# Patient Record
Sex: Female | Born: 1975 | Race: White | Hispanic: No | Smoking: Former smoker
Health system: Southern US, Community
[De-identification: ages and names within clinical notes are randomized; demographics above are authoritative.]

## PROBLEM LIST (undated history)

## (undated) DIAGNOSIS — G9332 Myalgic encephalomyelitis/chronic fatigue syndrome: Secondary | ICD-10-CM

## (undated) DIAGNOSIS — E162 Hypoglycemia, unspecified: Secondary | ICD-10-CM

## (undated) DIAGNOSIS — R5382 Chronic fatigue, unspecified: Secondary | ICD-10-CM

## (undated) DIAGNOSIS — F329 Major depressive disorder, single episode, unspecified: Secondary | ICD-10-CM

## (undated) DIAGNOSIS — G5600 Carpal tunnel syndrome, unspecified upper limb: Secondary | ICD-10-CM

## (undated) DIAGNOSIS — R5383 Other fatigue: Secondary | ICD-10-CM

## (undated) DIAGNOSIS — F32A Depression, unspecified: Secondary | ICD-10-CM

## (undated) DIAGNOSIS — F419 Anxiety disorder, unspecified: Secondary | ICD-10-CM

## (undated) HISTORY — DX: Chronic fatigue, unspecified: R53.82

## (undated) HISTORY — DX: Major depressive disorder, single episode, unspecified: F32.9

## (undated) HISTORY — DX: Depression, unspecified: F32.A

## (undated) HISTORY — DX: Myalgic encephalomyelitis/chronic fatigue syndrome: G93.32

## (undated) HISTORY — DX: Anxiety disorder, unspecified: F41.9

## (undated) HISTORY — DX: Carpal tunnel syndrome, unspecified upper limb: G56.00

## (undated) HISTORY — PX: CARPAL TUNNEL RELEASE: SHX101

## (undated) HISTORY — DX: Hypoglycemia, unspecified: E16.2

## (undated) HISTORY — DX: Other fatigue: R53.83

---

## 2007-11-08 ENCOUNTER — Ambulatory Visit: Payer: Self-pay | Admitting: Orthopedic Surgery

## 2007-11-15 ENCOUNTER — Ambulatory Visit: Payer: Self-pay | Admitting: Orthopedic Surgery

## 2011-10-08 ENCOUNTER — Emergency Department: Payer: Self-pay | Admitting: Emergency Medicine

## 2012-03-09 LAB — HM PAP SMEAR

## 2014-12-15 ENCOUNTER — Other Ambulatory Visit: Payer: Self-pay | Admitting: Unknown Physician Specialty

## 2014-12-15 MED ORDER — AMPHETAMINE-DEXTROAMPHET ER 30 MG PO CP24: 30.0000 mg | ORAL_CAPSULE | Freq: Two times a day (BID) | ORAL | Status: DC

## 2015-01-08 ENCOUNTER — Telehealth: Payer: Self-pay | Admitting: Unknown Physician Specialty

## 2015-01-08 MED ORDER — AMPHETAMINE-DEXTROAMPHETAMINE 30 MG PO TABS: 30.0000 mg | ORAL_TABLET | Freq: Two times a day (BID) | ORAL | Status: DC

## 2015-01-08 NOTE — Telephone Encounter (Signed)
Routing to provider  

## 2015-01-08 NOTE — Telephone Encounter (Signed)
Pt called stated her Adderrall RX is wrong. Pt stated RX should be regular Adderall not XR. Please contact pt ASAP. Thanks.

## 2015-01-08 NOTE — Telephone Encounter (Signed)
I checked [facility] and the [place] Controlled substance reporting system The most recent Rx was written for the extended release version and she takes the short-acting version She couldn't afford to pick up the XR [facility] I will write new Rx for the one she is used to taking and it can be picked up first thing in the morning

## 2015-01-12 ENCOUNTER — Other Ambulatory Visit: Payer: Self-pay | Admitting: Unknown Physician Specialty

## 2015-01-12 MED ORDER — AMPHETAMINE-DEXTROAMPHETAMINE 30 MG PO TABS: 30.0000 mg | ORAL_TABLET | Freq: Two times a day (BID) | ORAL | Status: DC

## 2015-02-10 ENCOUNTER — Emergency Department
Admission: EM | Admit: 2015-02-10 | Discharge: 2015-02-10 | Disposition: A | Discharge status: Home / Self Care | Payer: No Typology Code available for payment source | Attending: Emergency Medicine | Admitting: Emergency Medicine

## 2015-02-10 ENCOUNTER — Encounter: Payer: Self-pay | Admitting: Emergency Medicine

## 2015-02-10 ENCOUNTER — Emergency Department: Payer: No Typology Code available for payment source

## 2015-02-10 DIAGNOSIS — Z720 Tobacco use: Secondary | ICD-10-CM | POA: Diagnosis not present

## 2015-02-10 DIAGNOSIS — Y92410 Unspecified street and highway as the place of occurrence of the external cause: Secondary | ICD-10-CM | POA: Diagnosis not present

## 2015-02-10 DIAGNOSIS — Y998 Other external cause status: Secondary | ICD-10-CM | POA: Diagnosis not present

## 2015-02-10 DIAGNOSIS — R42 Dizziness and giddiness: Secondary | ICD-10-CM

## 2015-02-10 DIAGNOSIS — Y9389 Activity, other specified: Secondary | ICD-10-CM | POA: Diagnosis not present

## 2015-02-10 DIAGNOSIS — S0990XA Unspecified injury of head, initial encounter: Secondary | ICD-10-CM | POA: Diagnosis present

## 2015-02-10 DIAGNOSIS — S0093XA Contusion of unspecified part of head, initial encounter: Secondary | ICD-10-CM | POA: Diagnosis not present

## 2015-02-10 DIAGNOSIS — Z79899 Other long term (current) drug therapy: Secondary | ICD-10-CM | POA: Diagnosis not present

## 2015-02-10 MED ORDER — IBUPROFEN 800 MG PO TABS: 800.0000 mg | ORAL_TABLET | Freq: Three times a day (TID) | ORAL | Status: DC | PRN

## 2015-02-10 MED ORDER — CYCLOBENZAPRINE HCL 5 MG PO TABS
5.0000 mg | ORAL_TABLET | Freq: Three times a day (TID) | ORAL | Status: DC | PRN
Start: 2015-02-10 — End: 2015-06-29

## 2015-02-10 MED ORDER — MECLIZINE HCL 25 MG PO TABS: 25.0000 mg | ORAL_TABLET | Freq: Three times a day (TID) | ORAL | Status: DC | PRN

## 2015-02-10 NOTE — ED Notes (Signed)
Correction to pain pt typo on the back pain pt is having a headache

## 2015-02-10 NOTE — Discharge Instructions (Signed)
Dizziness °Dizziness is a common problem. It is a feeling of unsteadiness or light-headedness. You may feel like you are about to faint. Dizziness can lead to injury if you stumble or fall. A person of any age group can suffer from dizziness, but dizziness is more common in older adults. °CAUSES  °Dizziness can be caused by many different things, including: °· Middle ear problems. °· Standing for too long. °· Infections. °· An allergic reaction. °· Aging. °· An emotional response to something, such as the sight of blood. °· Side effects of medicines. °· Tiredness. °· Problems with circulation or blood pressure. °· Excessive use of alcohol or medicines, or illegal drug use. °· Breathing too fast (hyperventilation). °· An irregular heart rhythm (arrhythmia). °· A low red blood cell count (anemia). °· Pregnancy. °· Vomiting, diarrhea, fever, or other illnesses that cause body fluid loss (dehydration). °· Diseases or conditions such as Parkinson's disease, high blood pressure (hypertension), diabetes, and thyroid problems. °· Exposure to extreme heat. °DIAGNOSIS  °Your health care provider will ask about your symptoms, perform a physical exam, and perform an electrocardiogram (ECG) to record the electrical activity of your heart. Your health care provider may also perform other heart or blood tests to determine the cause of your dizziness. These may include: °· Transthoracic echocardiogram (TTE). During echocardiography, sound waves are used to evaluate how blood flows through your heart. °· Transesophageal echocardiogram (TEE). °· Cardiac monitoring. This allows your health care provider to monitor your heart rate and rhythm in real time. °· Holter monitor. This is a portable device that records your heartbeat and can help diagnose heart arrhythmias. It allows your health care provider to track your heart activity for several days if needed. °· Stress tests by exercise or by giving medicine that makes the heart beat  faster. °TREATMENT  °Treatment of dizziness depends on the cause of your symptoms and can vary greatly. °HOME CARE INSTRUCTIONS  °· Drink enough fluids to keep your urine clear or pale yellow. This is especially important in very hot weather. In older adults, it is also important in cold weather. °· Take your medicine exactly as directed if your dizziness is caused by medicines. When taking blood pressure medicines, it is especially important to get up slowly. °· Rise slowly from chairs and steady yourself until you feel okay. °· In the morning, first sit up on the side of the bed. When you feel okay, stand slowly while holding onto something until you know your balance is fine. °· Move your legs often if you need to stand in one place for a long time. Tighten and relax your muscles in your legs while standing. °· Have someone stay with you for 1-2 days if dizziness continues to be a problem. Do this until you feel you are well enough to stay alone. Have the person call your health care provider if he or she notices changes in you that are concerning. °· Do not drive or use heavy machinery if you feel dizzy. °· Do not drink alcohol. °SEEK IMMEDIATE MEDICAL CARE IF:  °· Your dizziness or light-headedness gets worse. °· You feel nauseous or vomit. °· You have problems talking, walking, or using your arms, hands, or legs. °· You feel weak. °· You are not thinking clearly or you have trouble forming sentences. It may take a friend or family member to notice this. °· You have chest pain, abdominal pain, shortness of breath, or sweating. °· Your vision changes. °· You notice   any bleeding.  You have side effects from medicine that seems to be getting worse rather than better. MAKE SURE YOU:   Understand these instructions.  Will watch your condition.  Will get help right away if you are not doing well or get worse. Document Released: 12/10/2000 Document Revised: 06/21/2013 Document Reviewed: 01/03/2011 [facility]  Patient Information 2015 [place], [place]. This information is not intended to replace advice given to you by your health care provider. Make sure you discuss any questions you have with your health care provider.  Head Injury You have received a head injury. It does not appear serious at this time. Headaches and vomiting are common following head injury. It should be easy to awaken from sleeping. Sometimes it is necessary for you to stay in the emergency department for a while for observation. Sometimes admission to the hospital may be needed. After injuries such as yours, most problems occur within the first 24 hours, but side effects may occur up to 7-10 days after the injury. It is important for you to carefully monitor your condition and contact your health care provider or seek immediate medical care if there is a change in your condition. WHAT ARE THE TYPES OF HEAD INJURIES? Head injuries can be as minor as a bump. Some head injuries can be more severe. More severe head injuries include:  A jarring injury to the brain (concussion).  A bruise of the brain (contusion). This mean there is bleeding in the brain that can cause swelling.  A cracked skull (skull fracture).  Bleeding in the brain that collects, clots, and forms a bump (hematoma). WHAT CAUSES A HEAD INJURY? A serious head injury is most likely to happen to someone who is in a car wreck and is not wearing a seat belt. Other causes of major head injuries include bicycle or motorcycle accidents, sports injuries, and falls. HOW ARE HEAD INJURIES DIAGNOSED? A complete history of the event leading to the injury and your current symptoms will be helpful in diagnosing head injuries. Many times, pictures of the brain, such as CT or MRI are needed to see the extent of the injury. Often, an overnight hospital stay is necessary for observation.  WHEN SHOULD I SEEK IMMEDIATE MEDICAL CARE?  You should get help right away if:  You have confusion  or drowsiness.  You feel sick to your stomach (nauseous) or have continued, forceful vomiting.  You have dizziness or unsteadiness that is getting worse.  You have severe, continued headaches not relieved by medicine. Only take over-the-counter or prescription medicines for pain, fever, or discomfort as directed by your health care provider.  You do not have normal function of the arms or legs or are unable to walk.  You notice changes in the black spots in the center of the colored part of your eye (pupil).  You have a clear or bloody fluid coming from your nose or ears.  You have a loss of vision. During the next 24 hours after the injury, you must stay with someone who can watch you for the warning signs. This person should contact local emergency services (911 in the [place] if you have seizures, you become unconscious, or you are unable to wake up. HOW CAN I PREVENT A HEAD INJURY IN THE FUTURE? The most important factor for preventing major head injuries is avoiding motor vehicle accidents. To minimize the potential for damage to your head, it is crucial to wear seat belts while riding in motor vehicles. Wearing helmets  while bike riding and playing collision sports (like football) is also helpful. Also, avoiding dangerous activities around the house will further help reduce your risk of head injury.  WHEN CAN I RETURN TO NORMAL ACTIVITIES AND ATHLETICS? You should be reevaluated by your health care provider before returning to these activities. If you have any of the following symptoms, you should not return to activities or contact sports until 1 week after the symptoms have stopped:  Persistent headache.  Dizziness or vertigo.  Poor attention and concentration.  Confusion.  Memory problems.  Nausea or vomiting.  Fatigue or tire easily.  Irritability.  Intolerant of bright lights or loud noises.  Anxiety or depression.  Disturbed sleep. MAKE SURE YOU:   Understand  these instructions.  Will watch your condition.  Will get help right away if you are not doing well or get worse. Document Released: 06/16/2005 Document Revised: 06/21/2013 Document Reviewed: 02/21/2013 [facility] Patient Information 2015 [place], [place]. This information is not intended to replace advice given to you by your health care provider. Make sure you discuss any questions you have with your health care provider.  Motor Vehicle Collision It is common to have multiple bruises and sore muscles after a motor vehicle collision (MVC). These tend to feel worse for the first 24 hours. You may have the most stiffness and soreness over the first several hours. You may also feel worse when you wake up the first morning after your collision. After this point, you will usually begin to improve with each day. The speed of improvement often depends on the severity of the collision, the number of injuries, and the location and nature of these injuries. HOME CARE INSTRUCTIONS  Put ice on the injured area.  Put ice in a plastic bag.  Place a towel between your skin and the bag.  Leave the ice on for 15-20 minutes, 3-4 times a day, or as directed by your health care provider.  Drink enough fluids to keep your urine clear or pale yellow. Do not drink alcohol.  Take a warm shower or bath once or twice a day. This will increase blood flow to sore muscles.  You may return to activities as directed by your caregiver. Be careful when lifting, as this may aggravate neck or back pain.  Only take over-the-counter or prescription medicines for pain, discomfort, or fever as directed by your caregiver. Do not use aspirin. This may increase bruising and bleeding. SEEK IMMEDIATE MEDICAL CARE IF:  You have numbness, tingling, or weakness in the arms or legs.  You develop severe headaches not relieved with medicine.  You have severe neck pain, especially tenderness in the middle of the back of your  neck.  You have changes in bowel or bladder control.  There is increasing pain in any area of the body.  You have shortness of breath, light-headedness, dizziness, or fainting.  You have chest pain.  You feel sick to your stomach (nauseous), throw up (vomit), or sweat.  You have increasing abdominal discomfort.  There is blood in your urine, stool, or vomit.  You have pain in your shoulder (shoulder strap areas).  You feel your symptoms are getting worse. MAKE SURE YOU:  Understand these instructions.  Will watch your condition.  Will get help right away if you are not doing well or get worse. Document Released: 06/16/2005 Document Revised: 10/31/2013 Document Reviewed: 11/13/2010 [facility] Patient Information 2015 [place], [place]. This information is not intended to replace advice given to you by  your health care provider. Make sure you discuss any questions you have with your health care provider. ° °

## 2015-02-10 NOTE — ED Notes (Signed)
Pt states she started having a headache last night took some medication but states it hasn't helped. Pt states no hx of this previously.
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
## 2015-02-12 ENCOUNTER — Other Ambulatory Visit: Payer: Self-pay | Admitting: Unknown Physician Specialty

## 2015-02-12 MED ORDER — AMPHETAMINE-DEXTROAMPHETAMINE 30 MG PO TABS: 30.0000 mg | ORAL_TABLET | Freq: Two times a day (BID) | ORAL | Status: DC

## 2015-02-14 ENCOUNTER — Other Ambulatory Visit: Payer: Self-pay | Admitting: Unknown Physician Specialty

## 2015-03-09 ENCOUNTER — Other Ambulatory Visit: Payer: Self-pay | Admitting: Unknown Physician Specialty

## 2015-03-09 MED ORDER — AMPHETAMINE-DEXTROAMPHETAMINE 30 MG PO TABS: 30.0000 mg | ORAL_TABLET | Freq: Two times a day (BID) | ORAL | Status: DC

## 2015-03-15 ENCOUNTER — Other Ambulatory Visit: Payer: Self-pay | Admitting: Unknown Physician Specialty

## 2015-03-15 NOTE — Telephone Encounter (Signed)
Last visit was more than 6 months ago; I will approve one month of requested Rx

## 2015-04-06 ENCOUNTER — Other Ambulatory Visit: Payer: Self-pay | Admitting: Unknown Physician Specialty

## 2015-04-06 MED ORDER — AMPHETAMINE-DEXTROAMPHETAMINE 30 MG PO TABS: 30.0000 mg | ORAL_TABLET | Freq: Two times a day (BID) | ORAL | Status: DC

## 2015-04-17 ENCOUNTER — Other Ambulatory Visit: Payer: Self-pay | Admitting: Family Medicine

## 2015-04-17 NOTE — Telephone Encounter (Signed)
Routing to provider  

## 2015-05-18 ENCOUNTER — Other Ambulatory Visit: Payer: Self-pay | Admitting: Unknown Physician Specialty

## 2015-06-05 ENCOUNTER — Other Ambulatory Visit: Payer: Self-pay

## 2015-06-05 ENCOUNTER — Other Ambulatory Visit: Payer: Self-pay | Admitting: Unknown Physician Specialty

## 2015-06-05 MED ORDER — AMPHETAMINE-DEXTROAMPHETAMINE 30 MG PO TABS: 30.0000 mg | ORAL_TABLET | Freq: Two times a day (BID) | ORAL | Status: DC

## 2015-06-05 NOTE — Telephone Encounter (Signed)
Patient called requesting a refill for her adderall. Patient was last seen in March and [facility] number is [phone]. Cheryl please route this back to me so I can call and schedule the patient an appointment.

## 2015-06-05 NOTE — Telephone Encounter (Signed)
She should be on automatic refills

## 2015-06-05 NOTE — Telephone Encounter (Signed)
Courtney CoasterGina said she was taken off that list because her prescriptions were sitting up front for a month.

## 2015-06-06 NOTE — Telephone Encounter (Signed)
Medication was refilled so I tried to call to let her know but the line was busy so I will try to call again later.

## 2015-06-08 NOTE — Telephone Encounter (Signed)
Looked in [facility] to see if there was a different phone number listed and there was. So I called it and let the patient know her prescription was ready to be picked up. I also scheduled her a follow-up visit for 06/29/15.

## 2015-06-18 DIAGNOSIS — E162 Hypoglycemia, unspecified: Secondary | ICD-10-CM | POA: Insufficient documentation

## 2015-06-18 DIAGNOSIS — F329 Major depressive disorder, single episode, unspecified: Secondary | ICD-10-CM

## 2015-06-18 DIAGNOSIS — G5600 Carpal tunnel syndrome, unspecified upper limb: Secondary | ICD-10-CM | POA: Insufficient documentation

## 2015-06-18 DIAGNOSIS — G9332 Myalgic encephalomyelitis/chronic fatigue syndrome: Secondary | ICD-10-CM | POA: Insufficient documentation

## 2015-06-18 DIAGNOSIS — F419 Anxiety disorder, unspecified: Secondary | ICD-10-CM

## 2015-06-18 DIAGNOSIS — F411 Generalized anxiety disorder: Secondary | ICD-10-CM | POA: Insufficient documentation

## 2015-06-18 DIAGNOSIS — R5383 Other fatigue: Secondary | ICD-10-CM

## 2015-06-18 DIAGNOSIS — R5382 Chronic fatigue, unspecified: Secondary | ICD-10-CM

## 2015-06-29 ENCOUNTER — Ambulatory Visit (INDEPENDENT_AMBULATORY_CARE_PROVIDER_SITE_OTHER): Payer: Self-pay | Admitting: Unknown Physician Specialty

## 2015-06-29 ENCOUNTER — Encounter: Payer: Self-pay | Admitting: Unknown Physician Specialty

## 2015-06-29 VITALS — BP 124/80 | HR 109 | Temp 98.9°F | Ht 64.0 in | Wt 143.2 lb

## 2015-06-29 DIAGNOSIS — F418 Other specified anxiety disorders: Secondary | ICD-10-CM

## 2015-06-29 DIAGNOSIS — F329 Major depressive disorder, single episode, unspecified: Secondary | ICD-10-CM

## 2015-06-29 DIAGNOSIS — F32A Depression, unspecified: Secondary | ICD-10-CM

## 2015-06-29 DIAGNOSIS — F419 Anxiety disorder, unspecified: Principal | ICD-10-CM

## 2015-06-29 MED ORDER — FLUOXETINE HCL 40 MG PO CAPS: 40.0000 mg | ORAL_CAPSULE | Freq: Every day | ORAL | Status: DC

## 2015-06-29 MED ORDER — AMPHETAMINE-DEXTROAMPHETAMINE 30 MG PO TABS: 30.0000 mg | ORAL_TABLET | Freq: Two times a day (BID) | ORAL | Status: DC

## 2015-06-29 NOTE — Progress Notes (Signed)
   BP 124/80 mmHg  Pulse 109  Temp(Src) 98.9 F (37.2 C)  Ht 5\' 4"  (1.626 m)  Wt 143 lb 3.2 oz (64.955 kg)  BMI 24.57 kg/m2  SpO2 97%  LMP  (LMP Unknown)   Subjective:    Patient ID: Courtney Davidson, female    DOB: Jan 22, 1976, 39 y.o.   MRN: [id number]  HPI: Courtney Davidson is a 39 y.o. female  Chief Complaint  Patient presents with  . Depression  . Medication Refill    pt states she needs refills on both medications  . ADHD   Depression Courtney Davidson is a patient with long-standing depression who was started on Adderall and Prozac by a psychiatrist a long time ago and maintained on this dose.  Still has bouts of depression but no suicidal plans.  She is typically unwilling to talk about her stressors or depression.  Today she is better than last visit in which she was unable to talk at all  [facility] 06/[facility]/2015  Decreased Interest 1  Down, Depressed, Hopeless 2  PHQ - 2 Score 3  Altered sleeping 2  Tired, decreased energy 1  Change in appetite 1  Feeling bad or failure about yourself  1  Trouble concentrating 0  Moving slowly or fidgety/restless 1  PHQ-9 Score 9      Relevant past medical, surgical, family and social history reviewed and updated as indicated. Interim medical history since our last visit reviewed. Allergies and medications reviewed and updated.  Review of Systems  Per HPI unless specifically indicated above     Objective:    BP 124/80 mmHg  Pulse 109  Temp(Src) 98.9 F (37.2 C)  Ht 5\' 4"  (1.626 m)  Wt 143 lb 3.2 oz (64.955 kg)  BMI 24.57 kg/m2  SpO2 97%  LMP  (LMP Unknown)  Wt Readings from Last 3 Encounters:  06/29/15 143 lb 3.2 oz (64.955 kg)  09/08/14 170 lb (77.111 kg)  02/10/15 163 lb (73.936 kg)    Physical Exam  Constitutional: She is oriented to person, place, and time. She appears well-developed and well-nourished. No distress.  HENT:  Head: Normocephalic and atraumatic.  Eyes: Conjunctivae and lids are  normal. Right eye exhibits no discharge. Left eye exhibits no discharge. No scleral icterus.  Neck: Normal range of motion. Neck supple. No JVD present. Carotid bruit is not present.  Cardiovascular: Normal rate, regular rhythm and normal heart sounds.   Pulmonary/Chest: Effort normal and breath sounds normal.  Abdominal: Normal appearance. There is no splenomegaly or hepatomegaly.  Musculoskeletal: Normal range of motion.  Neurological: She is alert and oriented to person, place, and time.  Skin: Skin is warm, dry and intact. No rash noted. No pallor.  Psychiatric: She has a normal mood and affect. Her behavior is normal. Judgment and thought content normal.    Results for orders placed or performed in visit on 06/18/15  HM PAP SMEAR  Result Value Ref Range   HM Pap smear from PP       Assessment & Plan:   Problem List Items Addressed This Visit      Unprioritized   Anxiety and depression - Primary    Stable, continue present medications.            Follow up plan: Return in about 6 months (around 12/28/2015).

## 2015-06-29 NOTE — Assessment & Plan Note (Signed)
Stable, continue present medications.   

## 2015-07-30 ENCOUNTER — Other Ambulatory Visit: Payer: Self-pay | Admitting: Unknown Physician Specialty

## 2015-07-30 MED ORDER — AMPHETAMINE-DEXTROAMPHETAMINE 30 MG PO TABS: 30.0000 mg | ORAL_TABLET | Freq: Two times a day (BID) | ORAL | Status: DC

## 2015-08-22 ENCOUNTER — Other Ambulatory Visit: Payer: Self-pay | Admitting: Unknown Physician Specialty

## 2015-08-22 MED ORDER — AMPHETAMINE-DEXTROAMPHETAMINE 30 MG PO TABS: 30.0000 mg | ORAL_TABLET | Freq: Two times a day (BID) | ORAL | Status: DC

## 2015-08-29 ENCOUNTER — Ambulatory Visit: Payer: Self-pay | Admitting: Unknown Physician Specialty

## 2015-09-21 ENCOUNTER — Other Ambulatory Visit: Payer: Self-pay | Admitting: Unknown Physician Specialty

## 2015-09-21 MED ORDER — AMPHETAMINE-DEXTROAMPHETAMINE 30 MG PO TABS: 30.0000 mg | ORAL_TABLET | Freq: Two times a day (BID) | ORAL | Status: DC

## 2015-10-15 ENCOUNTER — Other Ambulatory Visit: Payer: Self-pay | Admitting: Unknown Physician Specialty

## 2015-10-15 MED ORDER — AMPHETAMINE-DEXTROAMPHETAMINE 30 MG PO TABS: 30.0000 mg | ORAL_TABLET | Freq: Two times a day (BID) | ORAL | Status: DC

## 2015-11-12 ENCOUNTER — Other Ambulatory Visit: Payer: Self-pay | Admitting: Unknown Physician Specialty

## 2015-11-12 MED ORDER — AMPHETAMINE-DEXTROAMPHETAMINE 30 MG PO TABS: 30.0000 mg | ORAL_TABLET | Freq: Two times a day (BID) | ORAL | Status: DC

## 2015-12-11 ENCOUNTER — Other Ambulatory Visit: Payer: Self-pay | Admitting: Unknown Physician Specialty

## 2015-12-11 MED ORDER — AMPHETAMINE-DEXTROAMPHETAMINE 30 MG PO TABS: 30.0000 mg | ORAL_TABLET | Freq: Two times a day (BID) | ORAL | Status: DC

## 2016-01-14 ENCOUNTER — Other Ambulatory Visit: Payer: Self-pay | Admitting: Unknown Physician Specialty

## 2016-01-14 MED ORDER — AMPHETAMINE-DEXTROAMPHETAMINE 30 MG PO TABS: 30.0000 mg | ORAL_TABLET | Freq: Two times a day (BID) | ORAL | Status: DC

## 2016-01-15 ENCOUNTER — Encounter: Payer: Self-pay | Admitting: Emergency Medicine

## 2016-01-15 ENCOUNTER — Inpatient Hospital Stay
Admission: EM | Admit: 2016-01-15 | Discharge: 2016-01-18 | DRG: 872 | Disposition: A | Discharge status: Home / Self Care | Payer: Managed Care, Other (non HMO) | Attending: Internal Medicine | Admitting: Internal Medicine

## 2016-01-15 ENCOUNTER — Emergency Department: Payer: Managed Care, Other (non HMO)

## 2016-01-15 DIAGNOSIS — Z79899 Other long term (current) drug therapy: Secondary | ICD-10-CM | POA: Diagnosis not present

## 2016-01-15 DIAGNOSIS — R739 Hyperglycemia, unspecified: Secondary | ICD-10-CM

## 2016-01-15 DIAGNOSIS — R7989 Other specified abnormal findings of blood chemistry: Secondary | ICD-10-CM

## 2016-01-15 DIAGNOSIS — E876 Hypokalemia: Secondary | ICD-10-CM

## 2016-01-15 DIAGNOSIS — F419 Anxiety disorder, unspecified: Secondary | ICD-10-CM | POA: Diagnosis present

## 2016-01-15 DIAGNOSIS — A4151 Sepsis due to Escherichia coli [E. coli]: Principal | ICD-10-CM | POA: Diagnosis present

## 2016-01-15 DIAGNOSIS — R911 Solitary pulmonary nodule: Secondary | ICD-10-CM | POA: Diagnosis present

## 2016-01-15 DIAGNOSIS — Z8249 Family history of ischemic heart disease and other diseases of the circulatory system: Secondary | ICD-10-CM

## 2016-01-15 DIAGNOSIS — A419 Sepsis, unspecified organism: Secondary | ICD-10-CM

## 2016-01-15 DIAGNOSIS — I248 Other forms of acute ischemic heart disease: Secondary | ICD-10-CM | POA: Diagnosis present

## 2016-01-15 DIAGNOSIS — K8020 Calculus of gallbladder without cholecystitis without obstruction: Secondary | ICD-10-CM

## 2016-01-15 DIAGNOSIS — N10 Acute tubulo-interstitial nephritis: Secondary | ICD-10-CM | POA: Diagnosis present

## 2016-01-15 DIAGNOSIS — N12 Tubulo-interstitial nephritis, not specified as acute or chronic: Secondary | ICD-10-CM | POA: Diagnosis present

## 2016-01-15 DIAGNOSIS — E871 Hypo-osmolality and hyponatremia: Secondary | ICD-10-CM | POA: Diagnosis present

## 2016-01-15 DIAGNOSIS — R5382 Chronic fatigue, unspecified: Secondary | ICD-10-CM | POA: Diagnosis present

## 2016-01-15 DIAGNOSIS — D72829 Elevated white blood cell count, unspecified: Secondary | ICD-10-CM

## 2016-01-15 DIAGNOSIS — F17210 Nicotine dependence, cigarettes, uncomplicated: Secondary | ICD-10-CM | POA: Diagnosis present

## 2016-01-15 DIAGNOSIS — R778 Other specified abnormalities of plasma proteins: Secondary | ICD-10-CM

## 2016-01-15 DIAGNOSIS — F329 Major depressive disorder, single episode, unspecified: Secondary | ICD-10-CM | POA: Diagnosis present

## 2016-01-15 LAB — URINALYSIS COMPLETE WITH MICROSCOPIC (ARMC ONLY)
Bilirubin Urine: NEGATIVE
GLUCOSE, UA: 50 mg/dL — AB
KETONES UR: NEGATIVE mg/dL
Nitrite: NEGATIVE
PH: 5 (ref 5.0–8.0)
Protein, ur: >500 mg/dL — AB
SPECIFIC GRAVITY, URINE: 1.014 (ref 1.005–1.030)
TRANS EPITHEL UA: 4

## 2016-01-15 LAB — TROPONIN I
Troponin I: 0.2 ng/mL (ref <0.03)
Troponin I: 0.12 ng/mL (ref <0.03)

## 2016-01-15 LAB — BASIC METABOLIC PANEL
ANION GAP: 9 (ref 5–15)
BUN: 9 mg/dL (ref 6–20)
CALCIUM: 8.6 mg/dL — AB (ref 8.9–10.3)
CO2: 22 mmol/L (ref 22–32)
Chloride: 99 mmol/L — ABNORMAL LOW (ref 101–111)
Creatinine, Ser: 0.85 mg/dL (ref 0.44–1.00)
Glucose, Bld: 198 mg/dL — ABNORMAL HIGH (ref 65–99)
Potassium: 3.1 mmol/L — ABNORMAL LOW (ref 3.5–5.1)
Sodium: 130 mmol/L — ABNORMAL LOW (ref 135–145)

## 2016-01-15 LAB — POCT PREGNANCY, URINE: Preg Test, Ur: NEGATIVE

## 2016-01-15 LAB — CBC
HCT: 36.9 % (ref 35.0–47.0)
HEMOGLOBIN: 12.9 g/dL (ref 12.0–16.0)
MCH: 30.9 pg (ref 26.0–34.0)
MCHC: 35 g/dL (ref 32.0–36.0)
MCV: 88.2 fL (ref 80.0–100.0)
PLATELETS: 244 10*3/uL (ref 150–440)
RBC: 4.18 MIL/uL (ref 3.80–5.20)
RDW: 12.7 % (ref 11.5–14.5)
WBC: 16.7 10*3/uL — ABNORMAL HIGH (ref 3.6–11.0)

## 2016-01-15 LAB — HEPATIC FUNCTION PANEL
ALT: 20 U/L (ref 14–54)
AST: 33 U/L (ref 15–41)
Albumin: 3.5 g/dL (ref 3.5–5.0)
Alkaline Phosphatase: 53 U/L (ref 38–126)
Bilirubin, Direct: 0.1 mg/dL (ref 0.1–0.5)
Indirect Bilirubin: 0.6 mg/dL (ref 0.3–0.9)
Total Bilirubin: 0.7 mg/dL (ref 0.3–1.2)
Total Protein: 7 g/dL (ref 6.5–8.1)

## 2016-01-15 LAB — TSH: TSH: 0.858 u[IU]/mL (ref 0.350–4.500)

## 2016-01-15 LAB — MAGNESIUM: Magnesium: 1.7 mg/dL (ref 1.7–2.4)

## 2016-01-15 LAB — LACTIC ACID, PLASMA: Lactic Acid, Venous: 0.6 mmol/L (ref 0.5–1.9)

## 2016-01-15 MED ORDER — FLUOXETINE HCL 20 MG PO CAPS
40.0000 mg | ORAL_CAPSULE | Freq: Every day | ORAL | Status: DC
  Administered 2016-01-16 – 2016-01-18 (×3): 40 mg via ORAL
  Filled 2016-01-15 (×3): qty 2

## 2016-01-15 MED ORDER — SODIUM CHLORIDE 0.9 % IV BOLUS (SEPSIS)
1000.0000 mL | Freq: Once | INTRAVENOUS | Status: AC
  Administered 2016-01-15: 1000 mL via INTRAVENOUS

## 2016-01-15 MED ORDER — DOCUSATE SODIUM 100 MG PO CAPS
100.0000 mg | ORAL_CAPSULE | Freq: Two times a day (BID) | ORAL | Status: DC
  Administered 2016-01-15 – 2016-01-18 (×6): 100 mg via ORAL
  Filled 2016-01-15 (×6): qty 1

## 2016-01-15 MED ORDER — ACETAMINOPHEN 500 MG PO TABS
1000.0000 mg | ORAL_TABLET | Freq: Four times a day (QID) | ORAL | Status: DC | PRN
  Administered 2016-01-15 – 2016-01-17 (×4): 1000 mg via ORAL
  Filled 2016-01-15 (×3): qty 2

## 2016-01-15 MED ORDER — IBUPROFEN 400 MG PO TABS
ORAL_TABLET | ORAL | Status: AC
  Administered 2016-01-15: 400 mg via ORAL
  Filled 2016-01-15: qty 1

## 2016-01-15 MED ORDER — NICOTINE 21 MG/24HR TD PT24
21.0000 mg | MEDICATED_PATCH | Freq: Every day | TRANSDERMAL | Status: DC
  Administered 2016-01-15 – 2016-01-18 (×4): 21 mg via TRANSDERMAL
  Filled 2016-01-15 (×4): qty 1

## 2016-01-15 MED ORDER — DEXTROSE 5 % IV SOLN
2.0000 g | INTRAVENOUS | Status: DC
  Filled 2016-01-15: qty 2

## 2016-01-15 MED ORDER — ENOXAPARIN SODIUM 40 MG/0.4ML ~~LOC~~ SOLN
40.0000 mg | SUBCUTANEOUS | Status: DC
Start: 2016-01-15 — End: 2016-01-18
  Administered 2016-01-15 – 2016-01-17 (×3): 40 mg via SUBCUTANEOUS
  Filled 2016-01-15 (×3): qty 0.4

## 2016-01-15 MED ORDER — HYDROCODONE-ACETAMINOPHEN 5-325 MG PO TABS
1.0000 | ORAL_TABLET | ORAL | Status: DC | PRN
Start: 2016-01-15 — End: 2016-01-18
  Administered 2016-01-16: 1 via ORAL
  Administered 2016-01-16: 2 via ORAL
  Filled 2016-01-15: qty 2
  Filled 2016-01-15: qty 1

## 2016-01-15 MED ORDER — SODIUM CHLORIDE 0.9% FLUSH
3.0000 mL | Freq: Two times a day (BID) | INTRAVENOUS | Status: DC
  Administered 2016-01-16 – 2016-01-18 (×3): 3 mL via INTRAVENOUS

## 2016-01-15 MED ORDER — ONDANSETRON HCL 4 MG/2ML IJ SOLN
4.0000 mg | Freq: Four times a day (QID) | INTRAMUSCULAR | Status: DC | PRN
  Administered 2016-01-16: 4 mg via INTRAVENOUS
  Filled 2016-01-15: qty 2

## 2016-01-15 MED ORDER — SODIUM CHLORIDE 0.9 % IV BOLUS (SEPSIS)
250.0000 mL | Freq: Once | INTRAVENOUS | Status: AC
  Administered 2016-01-15: 250 mL via INTRAVENOUS

## 2016-01-15 MED ORDER — ACETAMINOPHEN 650 MG RE SUPP: 650.0000 mg | Freq: Four times a day (QID) | RECTAL | Status: DC | PRN

## 2016-01-15 MED ORDER — ACETAMINOPHEN 500 MG PO TABS
ORAL_TABLET | ORAL | Status: AC
  Administered 2016-01-15: 1000 mg via ORAL
  Filled 2016-01-15: qty 2

## 2016-01-15 MED ORDER — DEXTROSE 5 % IV SOLN
2.0000 g | Freq: Once | INTRAVENOUS | Status: AC
  Administered 2016-01-15: 2 g via INTRAVENOUS
  Filled 2016-01-15: qty 2

## 2016-01-15 MED ORDER — ACETAMINOPHEN 325 MG PO TABS: 650.0000 mg | ORAL_TABLET | Freq: Four times a day (QID) | ORAL | Status: DC | PRN

## 2016-01-15 MED ORDER — NICOTINE POLACRILEX 2 MG MT GUM
2.0000 mg | CHEWING_GUM | OROMUCOSAL | Status: DC | PRN
  Filled 2016-01-15: qty 1

## 2016-01-15 MED ORDER — AMPHETAMINE-DEXTROAMPHETAMINE 5 MG PO TABS
30.0000 mg | ORAL_TABLET | Freq: Two times a day (BID) | ORAL | Status: DC
  Administered 2016-01-16 – 2016-01-18 (×5): 30 mg via ORAL
  Filled 2016-01-15 (×6): qty 6

## 2016-01-15 MED ORDER — ONDANSETRON HCL 4 MG PO TABS: 4.0000 mg | ORAL_TABLET | Freq: Four times a day (QID) | ORAL | Status: DC | PRN

## 2016-01-15 MED ORDER — POTASSIUM CHLORIDE IN NACL 20-0.9 MEQ/L-% IV SOLN
INTRAVENOUS | Status: DC
Start: 2016-01-15 — End: 2016-01-18
  Administered 2016-01-15 – 2016-01-16 (×3): via INTRAVENOUS
  Administered 2016-01-17: 1000 mL via INTRAVENOUS
  Administered 2016-01-17 (×2): via INTRAVENOUS
  Filled 2016-01-15 (×9): qty 1000

## 2016-01-15 MED ORDER — ASPIRIN EC 325 MG PO TBEC
325.0000 mg | DELAYED_RELEASE_TABLET | Freq: Every day | ORAL | Status: DC
  Administered 2016-01-15 – 2016-01-18 (×4): 325 mg via ORAL
  Filled 2016-01-15 (×4): qty 1

## 2016-01-15 MED ORDER — IBUPROFEN 200 MG PO TABS
400.0000 mg | ORAL_TABLET | ORAL | Status: DC | PRN
  Administered 2016-01-15: 400 mg via ORAL

## 2016-01-15 NOTE — ED Provider Notes (Signed)
[facility] Emergency Department Provider Note   ____________________________________________  Time seen: ~1600  I have reviewed the triage vital signs and the nursing notes.   HISTORY  Chief Complaint Flank Pain   History limited by: Not Limited   HPI Courtney Davidson is a 40 y.o. female who presents from [facility] today because of concerns for right kidney stone. Patient states that starting about a week ago she was having pain in her lower abdomen. Yesterday the pain started migrating up into her right flank. It is severe. She has had some associated nausea. She denies history of kidney stones. [facility] where an urine showed 3+ blood and an acute abdomen series x-ray was performed which showed possible right kidney stone. The patient states that she has had fevers starting yesterday. She has had dysuria and increased urinary frequency.   Past Medical History  Diagnosis Date  . Fatigue   . Carpal tunnel syndrome   . Depression   . Anxiety   . Hypoglycemia   . CFS (chronic fatigue syndrome)     Patient Active Problem List   Diagnosis Date Noted  . Fatigue 06/18/2015  . Carpal tunnel syndrome 06/18/2015  . Anxiety and depression 06/18/2015  . Hypoglycemia 06/18/2015  . CFS (chronic fatigue syndrome) 06/18/2015    Past Surgical History  Procedure Laterality Date  . Cesarean section    . Carpal tunnel release Bilateral     Current Outpatient Rx  Name  Route  Sig  Dispense  Refill  . amphetamine-dextroamphetamine (ADDERALL) 30 MG tablet   Oral   Take 1 tablet by mouth 2 (two) times daily.   56 tablet   0   . FLUoxetine (PROZAC) 40 MG capsule   Oral   Take 1 capsule (40 mg total) by mouth daily.   90 capsule   1     Allergies Review of patient's allergies indicates no known allergies.  Family History  Problem Relation Age of Onset  . Cancer Mother     lung  . Diabetes Mother   . Stroke Mother    . Hypertension Mother     Social History Social History  Substance Use Topics  . Smoking status: Current Every Day Smoker    Types: Cigarettes  . Smokeless tobacco: Never Used  . Alcohol Use: No    Review of Systems  Constitutional: Positive for fever Cardiovascular: Negative for chest pain. Respiratory: Negative for shortness of breath. Gastrointestinal:Acid for right flank pain Genitourinary: Positive for dysuria Musculoskeletal: Negative for back pain. Skin: Negative for rash. Neurological: Negative for headaches, focal weakness or numbness.  10-point ROS otherwise negative.  ____________________________________________   PHYSICAL EXAM:  VITAL SIGNS: ED Triage Vitals  Enc Vitals Group     BP 01/15/16 1336 100/53 mmHg     Pulse Rate 01/15/16 1336 114     Resp 01/15/16 1336 22     Temp 01/15/16 1336 100.2 F (37.9 C)     Temp Source 01/15/16 1336 Oral     SpO2 01/15/16 1336 97 %     Weight 01/15/16 1336 148 lb (67.132 kg)     Height 01/15/16 1336 5\' 4"  (1.626 m)     Head Cir --      Peak Flow --      Pain Score 01/15/16 1341 6   Constitutional: Alert and oriented. Well appearing and in no distress. Eyes: Conjunctivae are normal. PERRL. Normal extraocular movements. ENT  Head: Normocephalic and atraumatic.   Nose: No congestion/rhinnorhea.   Mouth/Throat: Mucous membranes are moist.   Neck: No stridor. Hematological/Lymphatic/Immunilogical: No cervical lymphadenopathy. Cardiovascular: Normal rate, regular rhythm.  No murmurs, rubs, or gallops. Respiratory: Normal respiratory effort without tachypnea nor retractions. Breath sounds are clear and equal bilaterally. No wheezes/rales/rhonchi. Gastrointestinal: Soft and Tender to palpation of the right side. No distention. Positive right-sided CVA tenderness Genitourinary: Deferred Musculoskeletal: Normal range of motion in all extremities. No joint effusions.  No lower extremity tenderness nor  edema. Neurologic:  Normal speech and language. No gross focal neurologic deficits are appreciated.  Skin:  Skin is warm, dry and intact. No rash noted. Psychiatric: Mood and affect are normal. Speech and behavior are normal. Patient exhibits appropriate insight and judgment.  ____________________________________________    LABS (pertinent positives/negatives)  Labs Reviewed  URINALYSIS COMPLETEWITH MICROSCOPIC ([facility] ONLY) - Abnormal; Notable for the following:    Color, Urine YELLOW (*)    APPearance TURBID (*)    Glucose, UA 50 (*)    Hgb urine dipstick 2+ (*)    Protein, ur >500 (*)    Leukocytes, UA 3+ (*)    Bacteria, UA FEW (*)    Squamous Epithelial / LPF 0-5 (*)    All other components within normal limits  BASIC METABOLIC PANEL - Abnormal; Notable for the following:    Sodium 130 (*)    Potassium 3.1 (*)    Chloride 99 (*)    Glucose, Bld 198 (*)    Calcium 8.6 (*)    All other components within normal limits  CBC - Abnormal; Notable for the following:    WBC 16.7 (*)    All other components within normal limits  TROPONIN I - Abnormal; Notable for the following:    Troponin I 0.20 (*)    All other components within normal limits  URINE CULTURE  CULTURE, BLOOD (ROUTINE X 2)  CULTURE, BLOOD (ROUTINE X 2)  LACTIC ACID, PLASMA  LACTIC ACID, PLASMA  POCT PREGNANCY, URINE    ____________________________________________   EKG  I, Phineas SemenGraydon Sharlotte Baka, attending physician, personally viewed and interpreted this EKG  EKG Time: 1804 Rate: 130 Rhythm: sinus tachycardia Axis: rightward Intervals: qtc 462 QRS: narrow ST changes: no st elevtion Impression: abnormal ekg  ____________________________________________    RADIOLOGY Ct renal stone  IMPRESSION: Mild asymmetric right perinephric stranding, suspicious for pyelonephritis. No evidence of urolithiasis or hydronephrosis.  3.8 cm benign-appearing left ovarian cyst, most likely  physiologic.  Cholelithiasis. No radiographic evidence of cholecystitis.  Aortic atherosclerosis noted.  5 mm indeterminate left lower lobe pulmonary nodule. No follow-up needed if patient is low-risk. Non-contrast chest CT can be considered in 12 months if patient is high-risk. This recommendation follows the consensus statement: Guidelines for Management of Incidental Pulmonary Nodules Detected on CT Images:From the Fleischner Society 2017; published online before print (10.1148/[phone] [id number]).  ____________________________________________   PROCEDURES  Procedure(s) performed: None  Critical Care performed: Yes, see critical care note(s)  CRITICAL CARE Performed by: Phineas SemenGOODMAN, Manoah Deckard   Total critical care time: 35 minutes  Critical care time was exclusive of separately billable procedures and treating other patients.  Critical care was necessary to treat or prevent imminent or life-threatening deterioration.  Critical care was time spent personally by me on the following activities: development of treatment plan with patient and/or surrogate as well as nursing, discussions with consultants, evaluation of patient's response to treatment, examination of patient, obtaining history from patient or surrogate, ordering and performing treatments and  interventions, ordering and review of laboratory studies, ordering and review of radiographic studies, pulse oximetry and re-evaluation of patient's condition.  ____________________________________________   INITIAL IMPRESSION / ASSESSMENT AND PLAN / ED COURSE  Pertinent labs & imaging results that were available during my care of the patient were reviewed by me and considered in my medical decision making (see chart for details).  Patient presented to the emergency department today because of concerns for right-sided kidney stone. Urinalysis is concerning for infection. It is certainly possible that the blood in the urine is  secondary to an infection. However given that there was outside imaging was concerning for kidney stone will perform CT renal to rule out obstructed infected stone. Blood work is concerning for an elevated leukocytosis and vital signs consistent with Sirs. Will call code sepsis and given broad-spectrum antibiotic.  ----------------------------------------- 6:31 PM on 01/15/2016 -----------------------------------------  CT scanned consistent with pyelonephritis. No sign of stone. Patient's troponin was elevated. At this point I think likely secondary to sepsis. I do not think it represents ACS and no ST elevation noted on EKG. The patient will be admitted to the hospitalist service.  ____________________________________________   FINAL CLINICAL IMPRESSION(S) / ED DIAGNOSES  Final diagnoses:  Pyelonephritis  Sepsis, due to unspecified organism (HCC)  Elevated troponin     Note: This dictation was prepared with Dragon dictation. Any transcriptional errors that result from this process are unintentional    Phineas Semen, MD 01/15/16 [phone]

## 2016-01-15 NOTE — Consult Note (Signed)
Pharmacy Antibiotic Note  Courtney Davidson is a 40 y.o. female admitted on 01/15/2016 with sepsis and UTI.  Pharmacy has been consulted for ceftriaxone dosing.  Plan: ceftriaxone 2g q 24 hours until bacteremia can be ruled out  Height: 5\' 4"  (162.6 cm) Weight: 148 lb (67.132 kg) IBW/kg (Calculated) : 54.7  Temp (24hrs), Avg:100.9 F (38.3 C), Min:99.2 F (37.3 C), Max:103.1 F (39.5 C)   Recent Labs Lab 01/15/16 1343 01/15/16 1623  WBC 16.7*  --   CREATININE 0.85  --   LATICACIDVEN  --  0.6    Estimated Creatinine Clearance: 82.1 mL/min (by C-G formula based on Cr of 0.85).    No Known Allergies  Antimicrobials this admission: Ceftriaxone 7/18  >>    Dose adjustments this admission:   Microbiology results: 7/18 BCx:  7/18 UCx:    Thank you for allowing pharmacy to be a part of this patient's care.  Olene FlossMelissa D Bryanda Mikel, Pharm.D Clinical Pharmacist  01/15/2016 7:22 PM

## 2016-01-15 NOTE — ED Notes (Signed)
Pt reports right flank pain and lower back pain since yesterday; reports urinary frequency x1 week. Today with fever and chills.

## 2016-01-15 NOTE — H&P (Signed)
[facility] [place] [facility]   PATIENT NAME: Courtney Davidson    MR#:  [id number]  DATE OF BIRTH:  1975/07/08  DATE OF ADMISSION:  01/15/2016  PRIMARY CARE PHYSICIAN: No PCP Per Patient   REQUESTING/REFERRING PHYSICIAN:   CHIEF COMPLAINT:   Chief Complaint  Patient presents with  . Flank Pain    HISTORY OF PRESENT ILLNESS: Courtney Davidson  is a 40 y.o. female with a known history of Depression, anxiety, hyperglycemia, chronic fatigue syndrome, who presents to the hospital with complaints of fevers to 102, chills, suprapubic pain, dysuria, right flank pain. According to patient she was doing well up until approximately a week ago when she started having suprapubic abdominal pain, dysuria, she developed fevers about 2 days ago and right-sided flank pain. On arrival to the hospital patient was febrile to 101, during my evaluation to 103, having chills, tachycardic, tachypneic. Physical exam revealed tenderness of right flank, right upper quadrant, suprapubic area. Labs showed elevated white blood cell count, elevated troponin. Patient also complains of feeling presyncopal, especially with standing up, nauseated, increased frequency of urination, incontinence, having achy joints and muscles.  Hospitalist services were contacted for admission  PAST MEDICAL HISTORY:   Past Medical History  Diagnosis Date  . Fatigue   . Carpal tunnel syndrome   . Depression   . Anxiety   . Hypoglycemia   . CFS (chronic fatigue syndrome)     PAST SURGICAL HISTORY: Past Surgical History  Procedure Laterality Date  . Cesarean section    . Carpal tunnel release Bilateral     SOCIAL HISTORY:  Social History  Substance Use Topics  . Smoking status: Current Every Day Smoker    Types: Cigarettes  . Smokeless tobacco: Never Used  . Alcohol Use: No    FAMILY HISTORY:  Family History  Problem Relation Age of Onset  . Cancer Mother     lung  . Diabetes Mother   . Stroke  Mother   . Hypertension Mother     DRUG ALLERGIES: No Known Allergies  Review of Systems  Constitutional: Positive for fever, chills and malaise/fatigue. Negative for weight loss.  HENT: Negative for congestion.   Eyes: Negative for blurred vision and double vision.  Respiratory: Negative for cough, sputum production, shortness of breath and wheezing.   Cardiovascular: Negative for chest pain, palpitations, orthopnea, leg swelling and PND.  Gastrointestinal: Negative for nausea, vomiting, abdominal pain, diarrhea, constipation, blood in stool and melena.  Genitourinary: Positive for dysuria, urgency, frequency and flank pain. Negative for hematuria.  Musculoskeletal: Positive for myalgias. Negative for falls.  Skin: Negative for rash.  Neurological: Positive for weakness. Negative for dizziness.  Psychiatric/Behavioral: Negative for depression and memory loss. The patient is not nervous/anxious.     MEDICATIONS AT HOME:  Prior to Admission medications   Medication Sig Start Date End Date Taking? Authorizing Provider  amphetamine-dextroamphetamine (ADDERALL) 30 MG tablet Take 1 tablet by mouth 2 (two) times daily. 01/14/16  Yes Gabriel Cirri, NP  FLUoxetine (PROZAC) 40 MG capsule Take 1 capsule (40 mg total) by mouth daily. 06/29/15  Yes Gabriel Cirri, NP      PHYSICAL EXAMINATION:   VITAL SIGNS: Blood pressure 91/65, pulse 113, temperature 99.2 F (37.3 C), temperature source Oral, resp. rate 22, height  (1.626 m), weight 67.132 kg (148 lb), last menstrual period 01/01/2016, SpO2 98 %.  GENERAL:  40 y.o.-year-old patient lying in the bed In moderate to severe distress having shaken chills.  EYES: Pupils equal, round, reactive to light and accommodation. No scleral icterus. Extraocular muscles intact.  HEENT: Head atraumatic, normocephalic. Oropharynx and nasopharynx clear.  NECK:  Supple, no jugular venous distention. No thyroid enlargement, no tenderness.  LUNGS: Some  diminished breath sounds bilaterally, no wheezing, rales,rhonchi or crepitation. No use of accessory muscles of respiration. Tachypneic.   CARDIOVASCULAR: S1, S2 distant. No murmurs, rubs, or gallops.  ABDOMEN: Soft, tender in right upper quadrant, some voluntary guarding, suprapubically, overall, right side more than the left side, nondistended. Bowel sounds present. No organomegaly or mass. CVA tenderness on percussion on the right EXTREMITIES: No pedal edema, cyanosis, or clubbing.  NEUROLOGIC: Cranial nerves II through XII are intact. Muscle strength 5/5 in all extremities. Sensation intact. Gait not checked.  PSYCHIATRIC: The patient is alert and oriented x 3.  SKIN: No obvious rash, lesion, or ulcer.   LABORATORY PANEL:   CBC  Recent Labs Lab 01/15/16 1343  WBC 16.7*  HGB 12.9  HCT 36.9  PLT 244  MCV 88.2  MCH 30.9  MCHC 35.0  RDW 12.7   ------------------------------------------------------------------------------------------------------------------  Chemistries   Recent Labs Lab 01/15/16 1343  NA 130*  K 3.1*  CL 99*  CO2 22  GLUCOSE 198*  BUN 9  CREATININE 0.85  CALCIUM 8.6*   ------------------------------------------------------------------------------------------------------------------  Cardiac Enzymes  Recent Labs Lab 01/15/16 1358  TROPONINI 0.20*   ------------------------------------------------------------------------------------------------------------------  RADIOLOGY: Ct Renal Stone Study  01/15/2016  CLINICAL DATA:  Right flank and low back pain since yesterday. Fever and chills. Urinary frequency. EXAM: CT ABDOMEN AND PELVIS WITHOUT CONTRAST TECHNIQUE: Multidetector CT imaging of the abdomen and pelvis was performed following the standard protocol without IV contrast. COMPARISON:  None. FINDINGS: Lower chest: 5 mm pulmonary nodules seen in the posterior left lower lobe on image 5/series 4. Hepatobiliary: No mass visualized on this  un-enhanced exam. Calcified gallstone noted without evidence of cholecystitis or biliary dilatation. Pancreas: No mass or inflammatory process identified on this un-enhanced exam. Spleen: Within normal limits in size. Adrenals/Urinary Tract: No evidence of urolithiasis or hydronephrosis. Mild asymmetric right perinephric stranding is seen, suspicious for pyelonephritis. Unopacified urinary bladder is unremarkable in appearance. Stomach/Bowel: No evidence of obstruction, inflammatory process, or abnormal fluid collections. Normal appendix visualized. Vascular/Lymphatic: No pathologically enlarged lymph nodes. No evidence of abdominal aortic aneurysm. Aortic atherosclerosis noted. Reproductive: Normal appearance of uterus. 3.8 cm benign-appearing left ovarian cyst is seen which is most likely physiologic in a reproductive age female. No evidence of free fluid. Other: None. Musculoskeletal:  No suspicious bone lesions identified. IMPRESSION: Mild asymmetric right perinephric stranding, suspicious for pyelonephritis. No evidence of urolithiasis or hydronephrosis. 3.8 cm benign-appearing left ovarian cyst, most likely physiologic. Cholelithiasis.  No radiographic evidence of cholecystitis. Aortic atherosclerosis noted. 5 mm indeterminate left lower lobe pulmonary nodule. No follow-up needed if patient is low-risk. Non-contrast chest CT can be considered in 12 months if patient is high-risk. This recommendation follows the consensus statement: Guidelines for Management of Incidental Pulmonary Nodules Detected on CT Images:From the Fleischner Society 2017; published online before print (10.1148/[id number]). Electronically Signed   By: Myles RosenthalJohn  Stahl M.D.   On: 01/15/2016 17:08    EKG: Orders placed or performed during the hospital encounter of 01/15/16  . EKG 12-Lead  . EKG 12-Lead    IMPRESSION AND PLAN:  Active Problems:   Sepsis (HCC)   Acute pyelonephritis   Hyponatremia   Hypokalemia   Elevated  troponin   Leukocytosis   Hyperglycemia   Lung nodule <  6cm on CT   Cholelithiasis #1. Sepsis due to acute pyelonephritis, admitted to a medical floor. Continue IV fluids, antibiotics, blood cultures were taken as well as urinary cultures, follow  and adjust antibiotics depending on culture results #2. Acute pyelonephritis, cultures are taken, continue antibiotic therapy, just antibiotics according to culture results as above, CT scan failed to reveal stone or hydronephrosis #3. Hyponatremia, initiate IV fluids, follow sodium level in the morning #4. Hypokalemia, supplement intravenously, check magnesium level, supplement as needed #5. Leukocytosis, follow with therapy #6. Elevated troponin, likely demand ischemia, get echocardiogram, start aspirin, unable to use beta blockers and nitroglycerin due to hypotension. Get EKG  #7. Hypotension, continue IV fluids at high rate #8. Hyperglycemia, get hemoglobin A1c #9. Lung nodule, patient would benefit from a CT scan in 12 months #10 cholelithiasis, get LFTs, although suspect patient's right upper quadrant pain is very likely pyelonephritis related 11. Tobacco abuse. Counseling, discussed this patient for approximately 4 to 5 minutes, nicotine replacement therapy is going to be initiated, patient is agreeable   All the records are reviewed and case discussed with ED provider. Management plans discussed with the patient, family and they are in agreement.  CODE STATUS: Code Status History    This patient does not have a recorded code status. Please follow your organizational policy for patients in this situation.       TOTAL Critical care TIME TAKING CARE OF THIS PATIENT: 55  minutes.    Katharina Caper M.D on 01/15/2016 at 6:42 PM  Between 7am to 6pm - Pager - [phone] After 6pm go to [email or web address] - password [email or web address]  [facility] [place] [facility]  Office  [phone]  CC: Primary care physician; No PCP Per Patient

## 2016-01-16 ENCOUNTER — Encounter: Payer: Self-pay | Admitting: *Deleted

## 2016-01-16 ENCOUNTER — Inpatient Hospital Stay (HOSPITAL_COMMUNITY)
Admit: 2016-01-16 | Discharge: 2016-01-16 | Disposition: A | Discharge status: Home / Self Care | Payer: Managed Care, Other (non HMO) | Attending: Internal Medicine | Admitting: Internal Medicine

## 2016-01-16 DIAGNOSIS — R7989 Other specified abnormal findings of blood chemistry: Secondary | ICD-10-CM

## 2016-01-16 LAB — BLOOD CULTURE ID PANEL (REFLEXED)
Acinetobacter baumannii: NOT DETECTED
CANDIDA ALBICANS: NOT DETECTED
CANDIDA GLABRATA: NOT DETECTED
CANDIDA KRUSEI: NOT DETECTED
CANDIDA PARAPSILOSIS: NOT DETECTED
CANDIDA TROPICALIS: NOT DETECTED
Carbapenem resistance: NOT DETECTED
ENTEROBACTER CLOACAE COMPLEX: NOT DETECTED
ESCHERICHIA COLI: DETECTED — AB
Enterobacteriaceae species: DETECTED — AB
Enterococcus species: NOT DETECTED
HAEMOPHILUS INFLUENZAE: NOT DETECTED
KLEBSIELLA OXYTOCA: NOT DETECTED
KLEBSIELLA PNEUMONIAE: NOT DETECTED
LISTERIA MONOCYTOGENES: NOT DETECTED
METHICILLIN RESISTANCE: NOT DETECTED
Neisseria meningitidis: NOT DETECTED
PROTEUS SPECIES: NOT DETECTED
Pseudomonas aeruginosa: NOT DETECTED
SERRATIA MARCESCENS: NOT DETECTED
STREPTOCOCCUS PNEUMONIAE: NOT DETECTED
STREPTOCOCCUS PYOGENES: NOT DETECTED
Staphylococcus aureus (BCID): NOT DETECTED
Staphylococcus species: NOT DETECTED
Streptococcus agalactiae: NOT DETECTED
Streptococcus species: NOT DETECTED
Vancomycin resistance: NOT DETECTED

## 2016-01-16 LAB — ECHOCARDIOGRAM COMPLETE
HEIGHTINCHES: 64 in
WEIGHTICAEL: 2332.8 [oz_av]

## 2016-01-16 LAB — CBC
HCT: 33.7 % — ABNORMAL LOW (ref 35.0–47.0)
HEMOGLOBIN: 11.7 g/dL — AB (ref 12.0–16.0)
MCH: 31 pg (ref 26.0–34.0)
MCHC: 34.6 g/dL (ref 32.0–36.0)
MCV: 89.7 fL (ref 80.0–100.0)
PLATELETS: 203 10*3/uL (ref 150–440)
RBC: 3.76 MIL/uL — AB (ref 3.80–5.20)
RDW: 13 % (ref 11.5–14.5)
WBC: 16.9 10*3/uL — AB (ref 3.6–11.0)

## 2016-01-16 LAB — HEMOGLOBIN A1C: HEMOGLOBIN A1C: 6 % (ref 4.0–6.0)

## 2016-01-16 LAB — GLUCOSE, CAPILLARY: Glucose-Capillary: 99 mg/dL (ref 65–99)

## 2016-01-16 LAB — BASIC METABOLIC PANEL
ANION GAP: 3 — AB (ref 5–15)
BUN: 10 mg/dL (ref 6–20)
CHLORIDE: 108 mmol/L (ref 101–111)
CO2: 26 mmol/L (ref 22–32)
CREATININE: 0.76 mg/dL (ref 0.44–1.00)
Calcium: 7.8 mg/dL — ABNORMAL LOW (ref 8.9–10.3)
GFR calc non Af Amer: >60 mL/min (ref >60)
Glucose, Bld: 115 mg/dL — ABNORMAL HIGH (ref 65–99)
Potassium: 3.7 mmol/L (ref 3.5–5.1)
SODIUM: 137 mmol/L (ref 135–145)

## 2016-01-16 LAB — TROPONIN I
TROPONIN I: 0.19 ng/mL — AB (ref <0.03)
Troponin I: 0.16 ng/mL (ref <0.03)

## 2016-01-16 LAB — LACTIC ACID, PLASMA: Lactic Acid, Venous: 0.9 mmol/L (ref 0.5–1.9)

## 2016-01-16 MED ORDER — SODIUM CHLORIDE 0.9 % IV SOLN
1.0000 g | Freq: Three times a day (TID) | INTRAVENOUS | Status: DC
  Administered 2016-01-16 – 2016-01-17 (×3): 1 g via INTRAVENOUS
  Filled 2016-01-16 (×5): qty 1

## 2016-01-16 NOTE — Progress Notes (Signed)
Troponin level trending back upward from 0.12 to 0.19.  Dr. Sheryle Hailiamond notified.  Orders to draw another lactic acid level with morning labs to check for worsening sepsis.  Will continue to monitor.

## 2016-01-16 NOTE — Progress Notes (Signed)
*  PRELIMINARY RESULTS* Echocardiogram 2D Echocardiogram has been performed.  Cristela BlueHege, Angelica Frandsen 01/16/2016, 12:09 PM

## 2016-01-16 NOTE — Progress Notes (Signed)
PHARMACY - PHYSICIAN COMMUNICATION CRITICAL VALUE ALERT - BLOOD CULTURE IDENTIFICATION (BCID)  Results for orders placed or performed during the hospital encounter of 01/15/16  Blood Culture ID Panel (Reflexed) (Collected: 01/15/2016  4:15 PM)  Result Value Ref Range   Enterococcus species NOT DETECTED NOT DETECTED   Vancomycin resistance NOT DETECTED NOT DETECTED   Listeria monocytogenes NOT DETECTED NOT DETECTED   Staphylococcus species NOT DETECTED NOT DETECTED   Staphylococcus aureus NOT DETECTED NOT DETECTED   Methicillin resistance NOT DETECTED NOT DETECTED   Streptococcus species NOT DETECTED NOT DETECTED   Streptococcus agalactiae NOT DETECTED NOT DETECTED   Streptococcus pneumoniae NOT DETECTED NOT DETECTED   Streptococcus pyogenes NOT DETECTED NOT DETECTED   Acinetobacter baumannii NOT DETECTED NOT DETECTED   Enterobacteriaceae species DETECTED (A) NOT DETECTED   Enterobacter cloacae complex NOT DETECTED NOT DETECTED   Escherichia coli DETECTED (A) NOT DETECTED   Klebsiella oxytoca NOT DETECTED NOT DETECTED   Klebsiella pneumoniae NOT DETECTED NOT DETECTED   Proteus species NOT DETECTED NOT DETECTED   Serratia marcescens NOT DETECTED NOT DETECTED   Carbapenem resistance NOT DETECTED NOT DETECTED   Haemophilus influenzae NOT DETECTED NOT DETECTED   Neisseria meningitidis NOT DETECTED NOT DETECTED   Pseudomonas aeruginosa NOT DETECTED NOT DETECTED   Candida albicans NOT DETECTED NOT DETECTED   Candida glabrata NOT DETECTED NOT DETECTED   Candida krusei NOT DETECTED NOT DETECTED   Candida parapsilosis NOT DETECTED NOT DETECTED   Candida tropicalis NOT DETECTED NOT DETECTED   Name of physician (or Provider) Contacted: Dr. Elpidio AnisSudini  Changes to prescribed antibiotics: Antibiotics will be changed from ceftriaxone to meropenem 1 g IV q8h per discussion with MD  Cindi CarbonMary M Tranell Wojtkiewicz, PharmD, BCPS Clinical Pharmacist 01/16/2016  9:08 AM

## 2016-01-16 NOTE — Progress Notes (Signed)
[facility] - [place] at [facility]   PATIENT NAME: Courtney Davidson    MR#:  [id number]  DATE OF BIRTH:  1976/05/12  SUBJECTIVE:  CHIEF COMPLAINT:   Chief Complaint  Patient presents with  . Flank Pain   Afebrile today. Still has right flank pain. No vomiting No CP/SOB  REVIEW OF SYSTEMS:    Review of Systems  Constitutional: Positive for malaise/fatigue. Negative for fever and chills.  HENT: Negative for sore throat.   Eyes: Negative for blurred vision, double vision and pain.  Respiratory: Negative for cough, hemoptysis, shortness of breath and wheezing.   Cardiovascular: Negative for chest pain, palpitations, orthopnea and leg swelling.  Gastrointestinal: Positive for nausea and abdominal pain. Negative for heartburn, vomiting, diarrhea and constipation.  Genitourinary: Positive for dysuria. Negative for hematuria.  Musculoskeletal: Negative for back pain and joint pain.  Skin: Negative for rash.  Neurological: Positive for weakness. Negative for sensory change, speech change, focal weakness and headaches.  Endo/Heme/Allergies: Does not bruise/bleed easily.  Psychiatric/Behavioral: Negative for depression. The patient is not nervous/anxious.     DRUG ALLERGIES:  No Known Allergies  VITALS:  Blood pressure 113/66, pulse 102, temperature 99.5 F (37.5 C), temperature source Oral, resp. rate 24, height  (1.626 m), weight 66.134 kg (145 lb 12.8 oz), last menstrual period 01/01/2016, SpO2 93 %.  PHYSICAL EXAMINATION:   Physical Exam  GENERAL:  40 y.o.-year-old patient lying in the bed with no acute distress.  EYES: Pupils equal, round, reactive to light and accommodation. No scleral icterus. Extraocular muscles intact.  HEENT: Head atraumatic, normocephalic. Oropharynx and nasopharynx clear.  NECK:  Supple, no jugular venous distention. No thyroid enlargement, no tenderness.  LUNGS: Normal breath sounds bilaterally, no wheezing, rales,  rhonchi. No use of accessory muscles of respiration.  CARDIOVASCULAR: S1, S2 normal. No murmurs, rubs, or gallops.  ABDOMEN: Soft, nondistended. Bowel sounds present. No organomegaly or mass. Right flank tendereness EXTREMITIES: No cyanosis, clubbing or edema b/l.    NEUROLOGIC: Cranial nerves II through XII are intact. No focal Motor or sensory deficits b/l.   PSYCHIATRIC: The patient is alert and oriented x 3.  SKIN: No obvious rash, lesion, or ulcer.   LABORATORY PANEL:   CBC  Recent Labs Lab 01/16/16 0234  WBC 16.9*  HGB 11.7*  HCT 33.7*  PLT 203   ------------------------------------------------------------------------------------------------------------------ Chemistries   Recent Labs Lab 01/15/16 1343 01/16/16 0234  NA 130* 137  K 3.1* 3.7  CL 99* 108  CO2 22 26  GLUCOSE 198* 115*  BUN 9 10  CREATININE 0.85 0.76  CALCIUM 8.6* 7.8*  MG 1.7  --   AST 33  --   ALT 20  --   ALKPHOS 53  --   BILITOT 0.7  --    ------------------------------------------------------------------------------------------------------------------  Cardiac Enzymes  Recent Labs Lab 01/16/16 0747  TROPONINI 0.16*   ------------------------------------------------------------------------------------------------------------------  RADIOLOGY:  Ct Renal Stone Study  01/15/2016  CLINICAL DATA:  Right flank and low back pain since yesterday. Fever and chills. Urinary frequency. EXAM: CT ABDOMEN AND PELVIS WITHOUT CONTRAST TECHNIQUE: Multidetector CT imaging of the abdomen and pelvis was performed following the standard protocol without IV contrast. COMPARISON:  None. FINDINGS: Lower chest: 5 mm pulmonary nodules seen in the posterior left lower lobe on image 5/series 4. Hepatobiliary: No mass visualized on this un-enhanced exam. Calcified gallstone noted without evidence of cholecystitis or biliary dilatation. Pancreas: No mass or inflammatory process identified on this un-enhanced exam.  Spleen: Within  normal limits in size. Adrenals/Urinary Tract: No evidence of urolithiasis or hydronephrosis. Mild asymmetric right perinephric stranding is seen, suspicious for pyelonephritis. Unopacified urinary bladder is unremarkable in appearance. Stomach/Bowel: No evidence of obstruction, inflammatory process, or abnormal fluid collections. Normal appendix visualized. Vascular/Lymphatic: No pathologically enlarged lymph nodes. No evidence of abdominal aortic aneurysm. Aortic atherosclerosis noted. Reproductive: Normal appearance of uterus. 3.8 cm benign-appearing left ovarian cyst is seen which is most likely physiologic in a reproductive age female. No evidence of free fluid. Other: None. Musculoskeletal:  No suspicious bone lesions identified. IMPRESSION: Mild asymmetric right perinephric stranding, suspicious for pyelonephritis. No evidence of urolithiasis or hydronephrosis. 3.8 cm benign-appearing left ovarian cyst, most likely physiologic. Cholelithiasis.  No radiographic evidence of cholecystitis. Aortic atherosclerosis noted. 5 mm indeterminate left lower lobe pulmonary nodule. No follow-up needed if patient is low-risk. Non-contrast chest CT can be considered in 12 months if patient is high-risk. This recommendation follows the consensus statement: Guidelines for Management of Incidental Pulmonary Nodules Detected on CT Images:From the Fleischner Society 2017; published online before print (10.1148/[id number]). Electronically Signed   By: Myles RosenthalJohn  Stahl M.D.   On: 01/15/2016 17:08     ASSESSMENT AND PLAN:   * Sepsis due to acute peylonephritis and E coli bacteremia Change abx to meropenem. Await final cx results. Follow WBC and clinically  * Elevated troponin likely from sepsis Check echo Repeat troponin stable  * Lung nodule, patient would benefit from a CT scan in 12 months  * cholelithiasis - Normal LFTs All the records are reviewed and case discussed with Care Management/Social  Workerr. Management plans discussed with the patient, family and they are in agreement.  CODE STATUS: FULL  DVT Prophylaxis: SCDs  TOTAL TIME TAKING CARE OF THIS PATIENT: 35 minutes.   POSSIBLE D/C IN 2-3 DAYS, DEPENDING ON CLINICAL CONDITION.  Milagros LollSudini, Vincient Vanaman R M.D on 01/16/2016 at 10:33 AM  Between 7am to 6pm - Pager - [phone]  After 6pm go to [email or web address] - password [email or web address] [facility]  [place] [place] [facility]  Office  [phone]  CC: Primary care physician; No PCP Per Patient  Note: This dictation was prepared with Dragon dictation along with smaller phrase technology. Any transcriptional errors that result from this process are unintentional.

## 2016-01-17 LAB — BASIC METABOLIC PANEL
ANION GAP: 4 — AB (ref 5–15)
CALCIUM: 7.8 mg/dL — AB (ref 8.9–10.3)
CO2: 23 mmol/L (ref 22–32)
CREATININE: 0.54 mg/dL (ref 0.44–1.00)
Chloride: 109 mmol/L (ref 101–111)
GFR calc Af Amer: >60 mL/min (ref >60)
GFR calc non Af Amer: >60 mL/min (ref >60)
GLUCOSE: 113 mg/dL — AB (ref 65–99)
Potassium: 3.3 mmol/L — ABNORMAL LOW (ref 3.5–5.1)
Sodium: 136 mmol/L (ref 135–145)

## 2016-01-17 LAB — CBC WITH DIFFERENTIAL/PLATELET
BASOS ABS: 0 10*3/uL (ref 0–0.1)
Basophils Relative: 0 %
EOS PCT: 1 %
Eosinophils Absolute: 0.1 10*3/uL (ref 0–0.7)
HCT: 31.9 % — ABNORMAL LOW (ref 35.0–47.0)
Hemoglobin: 11.3 g/dL — ABNORMAL LOW (ref 12.0–16.0)
LYMPHS PCT: 11 %
Lymphs Abs: 1.3 10*3/uL (ref 1.0–3.6)
MCH: 31 pg (ref 26.0–34.0)
MCHC: 35.4 g/dL (ref 32.0–36.0)
MCV: 87.6 fL (ref 80.0–100.0)
MONO ABS: 1 10*3/uL — AB (ref 0.2–0.9)
MONOS PCT: 8 %
Neutro Abs: 10.2 10*3/uL — ABNORMAL HIGH (ref 1.4–6.5)
Neutrophils Relative %: 80 %
PLATELETS: 199 10*3/uL (ref 150–440)
RBC: 3.64 MIL/uL — ABNORMAL LOW (ref 3.80–5.20)
RDW: 12.9 % (ref 11.5–14.5)
WBC: 12.6 10*3/uL — ABNORMAL HIGH (ref 3.6–11.0)

## 2016-01-17 LAB — URINE CULTURE

## 2016-01-17 LAB — GLUCOSE, CAPILLARY: Glucose-Capillary: 100 mg/dL — ABNORMAL HIGH (ref 65–99)

## 2016-01-17 MED ORDER — FLUTICASONE PROPIONATE 50 MCG/ACT NA SUSP
2.0000 | Freq: Every day | NASAL | Status: DC
  Filled 2016-01-17: qty 16

## 2016-01-17 MED ORDER — CEFAZOLIN (ANCEF) 1 G IV SOLR: 1.0000 g | Freq: Three times a day (TID) | INTRAVENOUS | Status: DC

## 2016-01-17 MED ORDER — CEFAZOLIN IN D5W 1 GM/50ML IV SOLN
1.0000 g | Freq: Three times a day (TID) | INTRAVENOUS | Status: DC
  Administered 2016-01-17 – 2016-01-18 (×3): 1 g via INTRAVENOUS
  Filled 2016-01-17 (×5): qty 50

## 2016-01-17 MED ORDER — POTASSIUM CHLORIDE CRYS ER 20 MEQ PO TBCR
40.0000 meq | EXTENDED_RELEASE_TABLET | ORAL | Status: AC
Start: 2016-01-17 — End: 2016-01-17
  Administered 2016-01-17 (×2): 40 meq via ORAL
  Filled 2016-01-17 (×2): qty 2

## 2016-01-17 MED ORDER — SODIUM CHLORIDE 0.9 % IV BOLUS (SEPSIS)
500.0000 mL | Freq: Once | INTRAVENOUS | Status: AC
Start: 2016-01-17 — End: 2016-01-17
  Administered 2016-01-17: 500 mL via INTRAVENOUS

## 2016-01-17 MED ORDER — NAPROXEN 500 MG PO TABS
500.0000 mg | ORAL_TABLET | Freq: Three times a day (TID) | ORAL | Status: DC | PRN
  Administered 2016-01-17 – 2016-01-18 (×2): 500 mg via ORAL
  Filled 2016-01-17 (×3): qty 1

## 2016-01-17 NOTE — Progress Notes (Signed)
[facility] - [place] at [facility]   PATIENT NAME: Courtney ChildLoretta Davidson    MR#:  [id number]  DATE OF BIRTH:  10-Oct-1975  SUBJECTIVE:  CHIEF COMPLAINT:   Chief Complaint  Patient presents with  . Flank Pain   Febrile overnight with 100.4. Anxious. Has headache with sinus congestion. Dark urine.  REVIEW OF SYSTEMS:    Review of Systems  Constitutional: Positive for malaise/fatigue. Negative for fever and chills.  HENT: Negative for sore throat.   Eyes: Negative for blurred vision, double vision and pain.  Respiratory: Negative for cough, hemoptysis, shortness of breath and wheezing.   Cardiovascular: Negative for chest pain, palpitations, orthopnea and leg swelling.  Gastrointestinal: Positive for nausea and abdominal pain. Negative for heartburn, vomiting, diarrhea and constipation.  Genitourinary: Positive for dysuria. Negative for hematuria.  Musculoskeletal: Negative for back pain and joint pain.  Skin: Negative for rash.  Neurological: Positive for weakness. Negative for sensory change, speech change, focal weakness and headaches.  Endo/Heme/Allergies: Does not bruise/bleed easily.  Psychiatric/Behavioral: Negative for depression. The patient is not nervous/anxious.     DRUG ALLERGIES:  No Known Allergies  VITALS:  Blood pressure 140/69, pulse 119, temperature 98.6 F (37 C), temperature source Oral, resp. rate 17, height 5\' 4"  (1.626 m), weight 66.134 kg (145 lb 12.8 oz), last menstrual period 01/01/2016, SpO2 99 %.  PHYSICAL EXAMINATION:   Physical Exam  GENERAL:  40 y.o.-year-old patient lying in the bed with no acute distress.  EYES: Pupils equal, round, reactive to light and accommodation. No scleral icterus. Extraocular muscles intact.  HEENT: Head atraumatic, normocephalic. Oropharynx and nasopharynx clear.  NECK:  Supple, no jugular venous distention. No thyroid enlargement, no tenderness.  LUNGS: Normal breath sounds bilaterally, no  wheezing, rales, rhonchi. No use of accessory muscles of respiration.  CARDIOVASCULAR: S1, S2 normal. No murmurs, rubs, or gallops.  ABDOMEN: Soft, nondistended. Bowel sounds present. No organomegaly or mass. Right flank tendereness EXTREMITIES: No cyanosis, clubbing or edema b/l.    NEUROLOGIC: Cranial nerves II through XII are intact. No focal Motor or sensory deficits b/l.   PSYCHIATRIC: The patient is alert and oriented x 3. Anxious SKIN: No obvious rash, lesion, or ulcer.   LABORATORY PANEL:   CBC  Recent Labs Lab 01/17/16 0441  WBC 12.6*  HGB 11.3*  HCT 31.9*  PLT 199   ------------------------------------------------------------------------------------------------------------------ Chemistries   Recent Labs Lab 01/15/16 1343  01/17/16 0441  NA 130*  < > 136  K 3.1*  < > 3.3*  CL 99*  < > 109  CO2 22  < > 23  GLUCOSE 198*  < > 113*  BUN 9  < > <5*  CREATININE 0.85  < > 0.54  CALCIUM 8.6*  < > 7.8*  MG 1.7  --   --   AST 33  --   --   ALT 20  --   --   ALKPHOS 53  --   --   BILITOT 0.7  --   --   < > = values in this interval not displayed. ------------------------------------------------------------------------------------------------------------------  Cardiac Enzymes  Recent Labs Lab 01/16/16 0747  TROPONINI 0.16*   ------------------------------------------------------------------------------------------------------------------  RADIOLOGY:  Ct Renal Stone Study  01/15/2016  CLINICAL DATA:  Right flank and low back pain since yesterday. Fever and chills. Urinary frequency. EXAM: CT ABDOMEN AND PELVIS WITHOUT CONTRAST TECHNIQUE: Multidetector CT imaging of the abdomen and pelvis was performed following the standard protocol without IV contrast. COMPARISON:  None. FINDINGS:  Lower chest: 5 mm pulmonary nodules seen in the posterior left lower lobe on image 5/series 4. Hepatobiliary: No mass visualized on this un-enhanced exam. Calcified gallstone noted  without evidence of cholecystitis or biliary dilatation. Pancreas: No mass or inflammatory process identified on this un-enhanced exam. Spleen: Within normal limits in size. Adrenals/Urinary Tract: No evidence of urolithiasis or hydronephrosis. Mild asymmetric right perinephric stranding is seen, suspicious for pyelonephritis. Unopacified urinary bladder is unremarkable in appearance. Stomach/Bowel: No evidence of obstruction, inflammatory process, or abnormal fluid collections. Normal appendix visualized. Vascular/Lymphatic: No pathologically enlarged lymph nodes. No evidence of abdominal aortic aneurysm. Aortic atherosclerosis noted. Reproductive: Normal appearance of uterus. 3.8 cm benign-appearing left ovarian cyst is seen which is most likely physiologic in a reproductive age female. No evidence of free fluid. Other: None. Musculoskeletal:  No suspicious bone lesions identified. IMPRESSION: Mild asymmetric right perinephric stranding, suspicious for pyelonephritis. No evidence of urolithiasis or hydronephrosis. 3.8 cm benign-appearing left ovarian cyst, most likely physiologic. Cholelithiasis.  No radiographic evidence of cholecystitis. Aortic atherosclerosis noted. 5 mm indeterminate left lower lobe pulmonary nodule. No follow-up needed if patient is low-risk. Non-contrast chest CT can be considered in 12 months if patient is high-risk. This recommendation follows the consensus statement: Guidelines for Management of Incidental Pulmonary Nodules Detected on CT Images:From the Fleischner Society 2017; published online before print (10.1148/radiol.[id number]). Electronically Signed   By: Myles Rosenthal M.D.   On: 01/15/2016 17:08     ASSESSMENT AND PLAN:   * Sepsis due to acute peylonephritis and E coli bacteremia Change abx to meropenem. Await final cx And sensitivity results. Still febrile. Tachycardic and we'll bolus normal saline now.  * Elevated troponin likely from sepsis Echocardiogram  normal. Repeat troponin stable  * Lung nodule, patient would benefit from a CT scan in 12 months  * cholelithiasis - Normal LFTs  All the records are reviewed and case discussed with Care Management/Social Workerr. Management plans discussed with the patient, family and they are in agreement.  CODE STATUS: FULL  DVT Prophylaxis: SCDs  TOTAL TIME TAKING CARE OF THIS PATIENT: 35 minutes.   POSSIBLE D/C IN 2-3 DAYS, DEPENDING ON CLINICAL CONDITION.  Milagros Loll R M.D on 01/17/2016 at 9:48 AM  Between 7am to 6pm - Pager - [phone]  After 6pm go to [email or web address] - password [email or web address] [facility]  [place] [facility]  Office  [phone]  CC: Primary care physician; No PCP Per Patient  Note: This dictation was prepared with Dragon dictation along with smaller phrase technology. Any transcriptional errors that result from this process are unintentional.
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
## 2016-01-17 NOTE — Progress Notes (Signed)
Patient states that she feels like she's "...lying on a heating pad.Peggye Form.I've never felt like this before..."; temp 100.4 orally; tylenol given earlier; reassured patient that the Tylenol will help bring her fever down; offered drink, bath, refused at this time. Windy Carinaurner,Demaris Leavell K, RN 1:10 AM 01/17/2016

## 2016-01-18 LAB — CBC WITH DIFFERENTIAL/PLATELET
Basophils Absolute: 0 10*3/uL (ref 0–0.1)
Basophils Relative: 0 %
EOS PCT: 2 %
Eosinophils Absolute: 0.2 10*3/uL (ref 0–0.7)
HCT: 31.2 % — ABNORMAL LOW (ref 35.0–47.0)
Hemoglobin: 11.1 g/dL — ABNORMAL LOW (ref 12.0–16.0)
LYMPHS ABS: 1.4 10*3/uL (ref 1.0–3.6)
LYMPHS PCT: 19 %
MCH: 31.5 pg (ref 26.0–34.0)
MCHC: 35.5 g/dL (ref 32.0–36.0)
MCV: 88.8 fL (ref 80.0–100.0)
MONO ABS: 0.6 10*3/uL (ref 0.2–0.9)
Monocytes Relative: 8 %
Neutro Abs: 5.1 10*3/uL (ref 1.4–6.5)
Neutrophils Relative %: 71 %
PLATELETS: 217 10*3/uL (ref 150–440)
RBC: 3.51 MIL/uL — ABNORMAL LOW (ref 3.80–5.20)
RDW: 12.9 % (ref 11.5–14.5)
WBC: 7.3 10*3/uL (ref 3.6–11.0)

## 2016-01-18 LAB — GLUCOSE, CAPILLARY: Glucose-Capillary: 94 mg/dL (ref 65–99)

## 2016-01-18 LAB — CULTURE, BLOOD (ROUTINE X 2)

## 2016-01-18 LAB — BASIC METABOLIC PANEL
Anion gap: 4 — ABNORMAL LOW (ref 5–15)
BUN: 6 mg/dL (ref 6–20)
CHLORIDE: 108 mmol/L (ref 101–111)
CO2: 24 mmol/L (ref 22–32)
CREATININE: 0.51 mg/dL (ref 0.44–1.00)
Calcium: 8.2 mg/dL — ABNORMAL LOW (ref 8.9–10.3)
GFR calc Af Amer: >60 mL/min (ref >60)
GFR calc non Af Amer: >60 mL/min (ref >60)
GLUCOSE: 96 mg/dL (ref 65–99)
POTASSIUM: 4.1 mmol/L (ref 3.5–5.1)
Sodium: 136 mmol/L (ref 135–145)

## 2016-01-18 MED ORDER — CIPROFLOXACIN HCL 500 MG PO TABS: 500.0000 mg | ORAL_TABLET | Freq: Two times a day (BID) | ORAL | Status: DC

## 2016-01-18 NOTE — Discharge Instructions (Signed)
Resume prior activity and diet Pyelonephritis, Adult Pyelonephritis is a kidney infection. The kidneys are organs that help clean your blood by moving waste out of your blood and into your pee (urine). This infection can happen quickly, or it can last for a long time. In most cases, it clears up with treatment and does not cause other problems. HOME CARE Medicines  Take over-the-counter and prescription medicines only as told by your doctor.  Take your antibiotic medicine as told by your doctor. Do not stop taking the medicine even if you start to feel better. General Instructions  Drink enough fluid to keep your pee clear or pale yellow.  Avoid caffeine, tea, and carbonated drinks.  Pee (urinate) often. Avoid holding in pee for long periods of time.  Pee before and after sex.  After pooping (having a bowel movement), women should wipe from front to back. Use each tissue only once.  Keep all follow-up visits as told by your doctor. This is important. GET HELP IF:  You do not feel better after 2 days.  Your symptoms get worse.  You have a fever. GET HELP RIGHT AWAY IF:  You cannot take your medicine or drink fluids as told.  You have chills and shaking.  You throw up (vomit).  You have very bad pain in your side (flank) or back.  You feel very weak or you pass out (faint).   This information is not intended to replace advice given to you by your health care provider. Make sure you discuss any questions you have with your health care provider.   Document Released: 07/24/2004 Document Revised: 03/07/2015 Document Reviewed: 10/09/2014 Elsevier Interactive Patient Education [employer]2016 Elsevier Inc.

## 2016-01-18 NOTE — Progress Notes (Signed)
01/18/2016 11:11 AM  Courtney Davidson to be D/C'd Home per MD order.  Discussed prescriptions and follow up appointments with the patient. Prescriptions given to patient, medication list explained in detail. Pt verbalized understanding.    Medication List    TAKE these medications        amphetamine-dextroamphetamine 30 MG tablet  Commonly known as:  ADDERALL  Take 1 tablet by mouth 2 (two) times daily.     ciprofloxacin 500 MG tablet  Commonly known as:  CIPRO  Take 1 tablet (500 mg total) by mouth 2 (two) times daily.     FLUoxetine 40 MG capsule  Commonly known as:  PROZAC  Take 1 capsule (40 mg total) by mouth daily.        Filed Vitals:   01/18/16 0601 01/18/16 0928  BP: 116/73 92/52  Pulse: 77 95  Temp: 98.1 F (36.7 C) 98.5 F (36.9 C)  Resp: 24 19    Skin clean, dry and intact without evidence of skin break down, no evidence of skin tears noted. IV catheter discontinued intact. Site without signs and symptoms of complications. Dressing and pressure applied. Pt denies pain at this time. No complaints noted.  An After Visit Summary was printed and given to the patient. Patient escorted via WC, and D/C home via private auto.  Bradly Chrisougherty, Landen Knoedler E

## 2016-01-18 NOTE — Progress Notes (Signed)
[facility]         [place]   01/18/2016  Patient: Courtney Davidson   Date of Birth:  1976/04/02  Date of admission:  01/15/2016  Date of Discharge  01/18/2016    To Whom it May Concern:   Courtney Davidson  may return to work on 01/21/2016.  If you have any questions or concerns, please don't hesitate to call.  Sincerely,   Milagros LollSudini, Canuto Kingston R M.D Office : [phone]   .

## 2016-01-18 NOTE — Discharge Summary (Signed)
[facility] [place] at [facility]   PATIENT NAME: Courtney Davidson    MR#:  [id number]  DATE OF BIRTH:  [birth year]  DATE OF ADMISSION:  01/15/2016 ADMITTING PHYSICIAN: Katharina Caper, MD  DATE OF DISCHARGE: 01/18/2016 11:12 AM  PRIMARY CARE PHYSICIAN: No PCP Per Patient   ADMISSION DIAGNOSIS:  Pyelonephritis [N12] Elevated troponin [R79.89] Sepsis, due to unspecified organism (HCC) [A41.9]  DISCHARGE DIAGNOSIS:  Active Problems:   Sepsis (HCC)   Hyponatremia   Hypokalemia   Elevated troponin   Leukocytosis   Hyperglycemia   Lung nodule < 6cm on CT   Acute pyelonephritis   Cholelithiasis   SECONDARY DIAGNOSIS:   Past Medical History  Diagnosis Date  . Fatigue   . Carpal tunnel syndrome   . Depression   . Anxiety   . Hypoglycemia   . CFS (chronic fatigue syndrome)      ADMITTING HISTORY  HISTORY OF PRESENT ILLNESS: Courtney Davidson is a 40 y.o. female with a known history of Depression, anxiety, hyperglycemia, chronic fatigue syndrome, who presents to the hospital with complaints of fevers to 102, chills, suprapubic pain, dysuria, right flank pain. According to patient she was doing well up until approximately a week ago when she started having suprapubic abdominal pain, dysuria, she developed fevers about 2 days ago and right-sided flank pain. On arrival to the hospital patient was febrile to 101, during my evaluation to 103, having chills, tachycardic, tachypneic. Physical exam revealed tenderness of right flank, right upper quadrant, suprapubic area. Labs showed elevated white blood cell count, elevated troponin. Patient also complains of feeling presyncopal, especially with standing up, nauseated, increased frequency of urination, incontinence, having achy joints and muscles. Hospitalist services were contacted for admission  HOSPITAL COURSE:   * Sepsis due to acute peylonephritis and E coli bacteremia Patient was initially on meropenem.  This was changed to IV cefazolin after her sensitivities were available. Will discharge home on ciprofloxacin twice a day for 12 days. Afebrile for over 24 hours. Feels back to normal.  * Elevated troponin likely from sepsis Echocardiogram normal. Repeat troponin stable  * Lung nodule, patient would benefit from a CT scan in 12 months  * cholelithiasis - Normal LFTs  Patient is being discharged home on oral antibiotics to follow up with her primary care physician.  CONSULTS OBTAINED:     DRUG ALLERGIES:  No Known Allergies  DISCHARGE MEDICATIONS:   Discharge Medication List as of 01/18/2016 10:50 AM    START taking these medications   Details  ciprofloxacin (CIPRO) 500 MG tablet Take 1 tablet (500 mg total) by mouth 2 (two) times daily., Starting 01/18/2016, Until Discontinued, Normal      CONTINUE these medications which have NOT CHANGED   Details  amphetamine-dextroamphetamine (ADDERALL) 30 MG tablet Take 1 tablet by mouth 2 (two) times daily., Starting 01/14/2016, Until Discontinued, Print    FLUoxetine (PROZAC) 40 MG capsule Take 1 capsule (40 mg total) by mouth daily., Starting 06/29/2015, Until Discontinued, Normal        Today   VITAL SIGNS:  Blood pressure 92/52, pulse 95, temperature 98.5 F (36.9 C), temperature source Oral, resp. rate 19, height 5\' 4"  (1.626 m), weight 66.134 kg (145 lb 12.8 oz), last menstrual period 01/01/2016, SpO2 97 %.  I/O:   Intake/Output Summary (Last 24 hours) at 01/18/16 1452 Last data filed at 01/18/16 0900  Gross per 24 hour  Intake   1654 ml  Output    700 ml  Net    954 ml    PHYSICAL EXAMINATION:  Physical Exam  GENERAL:  40 y.o.-year-old patient lying in the bed with no acute distress.  LUNGS: Normal breath sounds bilaterally, no wheezing, rales,rhonchi or crepitation. No use of accessory muscles of respiration.  CARDIOVASCULAR: S1, S2 normal. No murmurs, rubs, or gallops.  ABDOMEN: Soft, non-tender, non-distended.  Bowel sounds present. No organomegaly or mass.  NEUROLOGIC: Moves all 4 extremities. PSYCHIATRIC: The patient is alert and oriented x 3.  SKIN: No obvious rash, lesion, or ulcer.   DATA REVIEW:   CBC  Recent Labs Lab 01/18/16 0410  WBC 7.3  HGB 11.1*  HCT 31.2*  PLT 217    Chemistries   Recent Labs Lab 01/15/16 1343  01/18/16 0410  NA 130*  < > 136  K 3.1*  < > 4.1  CL 99*  < > 108  CO2 22  < > 24  GLUCOSE 198*  < > 96  BUN 9  < > 6  CREATININE 0.85  < > 0.51  CALCIUM 8.6*  < > 8.2*  MG 1.7  --   --   AST 33  --   --   ALT 20  --   --   ALKPHOS 53  --   --   BILITOT 0.7  --   --   < > = values in this interval not displayed.  Cardiac Enzymes  Recent Labs Lab 01/16/16 0747  TROPONINI 0.16*    Microbiology Results  Results for orders placed or performed during the hospital encounter of 01/15/16  Urine C&S     Status: Abnormal   Collection Time: 01/15/16  1:43 PM  Result Value Ref Range Status   Specimen Description URINE, RANDOM  Final   Special Requests NONE  Final   Culture >=100,000 COLONIES/mL ESCHERICHIA COLI (A)  Final   Report Status 01/17/2016 FINAL  Final   Organism ID, Bacteria ESCHERICHIA COLI (A)  Final      Susceptibility   Escherichia coli - MIC*    AMPICILLIN >=32 RESISTANT Resistant     CEFAZOLIN <=4 SENSITIVE Sensitive     CEFTRIAXONE <=1 SENSITIVE Sensitive     CIPROFLOXACIN <=0.25 SENSITIVE Sensitive     GENTAMICIN <=1 SENSITIVE Sensitive     IMIPENEM <=0.25 SENSITIVE Sensitive     NITROFURANTOIN <=16 SENSITIVE Sensitive     TRIMETH/SULFA >=320 RESISTANT Resistant     AMPICILLIN/SULBACTAM 4 SENSITIVE Sensitive     PIP/TAZO <=4 SENSITIVE Sensitive     Extended ESBL NEGATIVE Sensitive     * >=100,000 COLONIES/mL ESCHERICHIA COLI  Blood Culture (routine x 2)     Status: Abnormal   Collection Time: 01/15/16  4:15 PM  Result Value Ref Range Status   Specimen Description   Final    BLOOD LEFT ANTECUBITAL Performed at [facility]    Special Requests   Final    BOTTLES DRAWN AEROBIC AND ANAEROBIC 10CCAERO,10CCANA   Culture  Setup Time   Final    GRAM NEGATIVE RODS ANAEROBIC BOTTLE ONLY CRITICAL RESULT CALLED TO, READ BACK BY AND VERIFIED WITH: [facility] SWAYNE 01/16/16 0841 SGD    Culture ESCHERICHIA COLI (A)  Final   Report Status 01/18/2016 FINAL  Final   Organism ID, Bacteria ESCHERICHIA COLI  Final      Susceptibility   Escherichia coli - MIC*    AMPICILLIN >=32 RESISTANT Resistant     CEFAZOLIN <=4 SENSITIVE Sensitive     CEFEPIME <=1  SENSITIVE Sensitive     CEFTAZIDIME <=1 SENSITIVE Sensitive     CEFTRIAXONE <=1 SENSITIVE Sensitive     CIPROFLOXACIN <=0.25 SENSITIVE Sensitive     GENTAMICIN <=1 SENSITIVE Sensitive     IMIPENEM <=0.25 SENSITIVE Sensitive     TRIMETH/SULFA >=320 RESISTANT Resistant     AMPICILLIN/SULBACTAM 4 SENSITIVE Sensitive     PIP/TAZO <=4 SENSITIVE Sensitive     Extended ESBL NEGATIVE Sensitive     * ESCHERICHIA COLI  Blood Culture ID Panel (Reflexed)     Status: Abnormal   Collection Time: 01/15/16  4:15 PM  Result Value Ref Range Status   Enterococcus species NOT DETECTED NOT DETECTED Final   Vancomycin resistance NOT DETECTED NOT DETECTED Final   Listeria monocytogenes NOT DETECTED NOT DETECTED Final   Staphylococcus species NOT DETECTED NOT DETECTED Final   Staphylococcus aureus NOT DETECTED NOT DETECTED Final   Methicillin resistance NOT DETECTED NOT DETECTED Final   Streptococcus species NOT DETECTED NOT DETECTED Final   Streptococcus agalactiae NOT DETECTED NOT DETECTED Final   Streptococcus pneumoniae NOT DETECTED NOT DETECTED Final   Streptococcus pyogenes NOT DETECTED NOT DETECTED Final   Acinetobacter baumannii NOT DETECTED NOT DETECTED Final   Enterobacteriaceae species DETECTED (A) NOT DETECTED Final    Comment: CRITICAL RESULT CALLED TO, READ BACK BY AND VERIFIED WITH: MARY SWAYNE 01/16/16 0841 SGD    Enterobacter cloacae complex NOT DETECTED NOT DETECTED  Final   Escherichia coli DETECTED (A) NOT DETECTED Final    Comment: CRITICAL RESULT CALLED TO, READ BACK BY AND VERIFIED WITH: MARY SWAYNE 01/16/16 0841 SGD    Klebsiella oxytoca NOT DETECTED NOT DETECTED Final   Klebsiella pneumoniae NOT DETECTED NOT DETECTED Final   Proteus species NOT DETECTED NOT DETECTED Final   Serratia marcescens NOT DETECTED NOT DETECTED Final   Carbapenem resistance NOT DETECTED NOT DETECTED Final   Haemophilus influenzae NOT DETECTED NOT DETECTED Final   Neisseria meningitidis NOT DETECTED NOT DETECTED Final   Pseudomonas aeruginosa NOT DETECTED NOT DETECTED Final   Candida albicans NOT DETECTED NOT DETECTED Final   Candida glabrata NOT DETECTED NOT DETECTED Final   Candida krusei NOT DETECTED NOT DETECTED Final   Candida parapsilosis NOT DETECTED NOT DETECTED Final   Candida tropicalis NOT DETECTED NOT DETECTED Final  Blood Culture (routine x 2)     Status: None (Preliminary result)   Collection Time: 01/15/16  4:23 PM  Result Value Ref Range Status   Specimen Description BLOOD LEFT WRIST  Final   Special Requests   Final    BOTTLES DRAWN AEROBIC AND ANAEROBIC 10CCAERO,10CCANA   Culture NO GROWTH 3 DAYS  Final   Report Status PENDING  Incomplete    RADIOLOGY:  No results found.  Follow up with PCP in 1 week.  Management plans discussed with the patient, family and they are in agreement.  CODE STATUS:  Code Status History    Date Active Date Inactive Code Status Order ID Comments User Context   01/15/2016  7:45 PM 01/18/2016  2:13 PM Full Code [id number]  Katharina Caperima Vaickute, MD Inpatient      TOTAL TIME TAKING CARE OF THIS PATIENT ON DAY OF DISCHARGE: more than 30 minutes.   Milagros LollSudini, Tawan Corkern R M.D on 01/18/2016 at 2:52 PM  Between 7am to 6pm - Pager - [phone]  After 6pm go to [email or web address] - password [email or web address] [facility]  Office  [phone]  CC: Primary care physician; No PCP Per Patient  Note: This dictation  was  prepared with Dragon dictation along with smaller phrase technology. Any transcriptional errors that result from this process are unintentional.

## 2016-01-20 LAB — CULTURE, BLOOD (ROUTINE X 2): Culture: NO GROWTH

## 2016-01-25 ENCOUNTER — Encounter: Payer: Self-pay | Admitting: Unknown Physician Specialty

## 2016-01-25 ENCOUNTER — Ambulatory Visit (INDEPENDENT_AMBULATORY_CARE_PROVIDER_SITE_OTHER): Payer: Managed Care, Other (non HMO) | Admitting: Unknown Physician Specialty

## 2016-01-25 DIAGNOSIS — N10 Acute tubulo-interstitial nephritis: Secondary | ICD-10-CM

## 2016-01-25 DIAGNOSIS — F329 Major depressive disorder, single episode, unspecified: Secondary | ICD-10-CM

## 2016-01-25 DIAGNOSIS — F32A Depression, unspecified: Secondary | ICD-10-CM

## 2016-01-25 DIAGNOSIS — F418 Other specified anxiety disorders: Secondary | ICD-10-CM

## 2016-01-25 DIAGNOSIS — F419 Anxiety disorder, unspecified: Secondary | ICD-10-CM

## 2016-01-25 LAB — UA/M W/RFLX CULTURE, ROUTINE
BILIRUBIN UA: NEGATIVE
GLUCOSE, UA: NEGATIVE
Ketones, UA: NEGATIVE
LEUKOCYTES UA: NEGATIVE
Nitrite, UA: NEGATIVE
PH UA: 5 (ref 5.0–7.5)
PROTEIN UA: NEGATIVE
RBC UA: NEGATIVE
Urobilinogen, Ur: 0.2 mg/dL (ref 0.2–1.0)

## 2016-01-25 NOTE — Assessment & Plan Note (Signed)
Stable, continue present medications.   

## 2016-01-25 NOTE — Assessment & Plan Note (Signed)
Resoved.  Continue present medications.

## 2016-01-25 NOTE — Progress Notes (Signed)
BP 107/70 (BP Location: Left Arm, Patient Position: Sitting, Cuff Size: Normal)   Pulse 84   Temp 98.5 F (36.9 C)   Wt 144 lb (65.3 kg)   LMP 01/18/2016   SpO2 96%   BMI 24.72 kg/m    Subjective:    Patient ID: Courtney Davidson, female    DOB: [birth year], 40 y.o.   MRN: [id number]  HPI: Courtney Davidson is a 40 y.o. female  Chief Complaint  Patient presents with  . Hospitalization Follow-up   Pt is here for a hospital f/u in which she was admitted for pylelonephritis following an appointment at urgent care.  She is still taking Cipro.  She feels 100% better but still feels sluggish.  Reviewed her discharge notes and labs.  All labs returned to normal limits before she left the hospital  Depression Courtney Davidson is a patient with long-standing depression who was started on Adderall and Prozac by a psychiatrist a long time ago and maintained on this dose.  Still has bouts of depression but no suicidal plans.  She is typically unwilling to talk about her stressors or depression.   Depression screen [facility] 2/9 01/25/2016 06/29/2015  Decreased Interest 1 1  Down, Depressed, Hopeless 1 2  PHQ - 2 Score 2 3  Altered sleeping 2 2  Tired, decreased energy 0 1  Change in appetite 2 1  Feeling bad or failure about yourself  0 1  Trouble concentrating 0 0  Moving slowly or fidgety/restless 0 1  Suicidal thoughts 0 -  PHQ-9 Score 6 9       Relevant past medical, surgical, family and social history reviewed and updated as indicated. Interim medical history since our last visit reviewed. Allergies and medications reviewed and updated.  Review of Systems  Per HPI unless specifically indicated above     Objective:    BP 107/70 (BP Location: Left Arm, Patient Position: Sitting, Cuff Size: Normal)   Pulse 84   Temp 98.5 F (36.9 C)   Wt 144 lb (65.3 kg)   LMP 01/18/2016   SpO2 96%   BMI 24.72 kg/m   Wt Readings from Last 3 Encounters:  01/25/16 144 lb (65.3 kg)  01/15/16 145  lb 12.8 oz (66.1 kg)  06/29/15 143 lb 3.2 oz (65 kg)    Physical Exam  Constitutional: She is oriented to person, place, and time. She appears well-developed and well-nourished. No distress.  HENT:  Head: Normocephalic and atraumatic.  Eyes: Conjunctivae and lids are normal. Right eye exhibits no discharge. Left eye exhibits no discharge. No scleral icterus.  Neck: Normal range of motion. Neck supple. No JVD present. Carotid bruit is not present.  Cardiovascular: Normal rate, regular rhythm and normal heart sounds.   Pulmonary/Chest: Effort normal and breath sounds normal.  Abdominal: Soft. Normal appearance and bowel sounds are normal. She exhibits no distension and no mass. There is no splenomegaly or hepatomegaly. There is no tenderness. There is no rebound and no guarding.  Musculoskeletal: Normal range of motion.  Neurological: She is alert and oriented to person, place, and time.  Skin: Skin is warm, dry and intact. No rash noted. No pallor.  Psychiatric: She has a normal mood and affect. Her behavior is normal. Judgment and thought content normal.    Results for orders placed or performed during the hospital encounter of 01/15/16  Urine C&S  Result Value Ref Range   Specimen Description URINE, RANDOM    Special Requests NONE  Culture >=100,000 COLONIES/mL ESCHERICHIA COLI (A)    Report Status 01/17/2016 FINAL    Organism ID, Bacteria ESCHERICHIA COLI (A)       Susceptibility   Escherichia coli - MIC*    AMPICILLIN >=32 RESISTANT Resistant     CEFAZOLIN <=4 SENSITIVE Sensitive     CEFTRIAXONE <=1 SENSITIVE Sensitive     CIPROFLOXACIN <=0.25 SENSITIVE Sensitive     GENTAMICIN <=1 SENSITIVE Sensitive     IMIPENEM <=0.25 SENSITIVE Sensitive     NITROFURANTOIN <=16 SENSITIVE Sensitive     TRIMETH/SULFA >=320 RESISTANT Resistant     AMPICILLIN/SULBACTAM 4 SENSITIVE Sensitive     PIP/TAZO <=4 SENSITIVE Sensitive     Extended ESBL NEGATIVE Sensitive     * >=100,000 COLONIES/mL  ESCHERICHIA COLI  Blood Culture (routine x 2)  Result Value Ref Range   Specimen Description      BLOOD LEFT ANTECUBITAL Performed at [facility]    Special Requests      BOTTLES DRAWN AEROBIC AND ANAEROBIC 10CCAERO,10CCANA   Culture  Setup Time      GRAM NEGATIVE RODS ANAEROBIC BOTTLE ONLY CRITICAL RESULT CALLED TO, READ BACK BY AND VERIFIED WITH: MARY SWAYNE 01/16/16 0841 SGD    Culture ESCHERICHIA COLI (A)    Report Status 01/18/2016 FINAL    Organism ID, Bacteria ESCHERICHIA COLI       Susceptibility   Escherichia coli - MIC*    AMPICILLIN >=32 RESISTANT Resistant     CEFAZOLIN <=4 SENSITIVE Sensitive     CEFEPIME <=1 SENSITIVE Sensitive     CEFTAZIDIME <=1 SENSITIVE Sensitive     CEFTRIAXONE <=1 SENSITIVE Sensitive     CIPROFLOXACIN <=0.25 SENSITIVE Sensitive     GENTAMICIN <=1 SENSITIVE Sensitive     IMIPENEM <=0.25 SENSITIVE Sensitive     TRIMETH/SULFA >=320 RESISTANT Resistant     AMPICILLIN/SULBACTAM 4 SENSITIVE Sensitive     PIP/TAZO <=4 SENSITIVE Sensitive     Extended ESBL NEGATIVE Sensitive     * ESCHERICHIA COLI  Blood Culture (routine x 2)  Result Value Ref Range   Specimen Description BLOOD LEFT WRIST    Special Requests      BOTTLES DRAWN AEROBIC AND ANAEROBIC 10CCAERO,10CCANA   Culture NO GROWTH 5 DAYS    Report Status 01/20/2016 FINAL   Blood Culture ID Panel (Reflexed)  Result Value Ref Range   Enterococcus species NOT DETECTED NOT DETECTED   Vancomycin resistance NOT DETECTED NOT DETECTED   Listeria monocytogenes NOT DETECTED NOT DETECTED   Staphylococcus species NOT DETECTED NOT DETECTED   Staphylococcus aureus NOT DETECTED NOT DETECTED   Methicillin resistance NOT DETECTED NOT DETECTED   Streptococcus species NOT DETECTED NOT DETECTED   Streptococcus agalactiae NOT DETECTED NOT DETECTED   Streptococcus pneumoniae NOT DETECTED NOT DETECTED   Streptococcus pyogenes NOT DETECTED NOT DETECTED   Acinetobacter baumannii NOT DETECTED NOT  DETECTED   Enterobacteriaceae species DETECTED (A) NOT DETECTED   Enterobacter cloacae complex NOT DETECTED NOT DETECTED   Escherichia coli DETECTED (A) NOT DETECTED   Klebsiella oxytoca NOT DETECTED NOT DETECTED   Klebsiella pneumoniae NOT DETECTED NOT DETECTED   Proteus species NOT DETECTED NOT DETECTED   Serratia marcescens NOT DETECTED NOT DETECTED   Carbapenem resistance NOT DETECTED NOT DETECTED   Haemophilus influenzae NOT DETECTED NOT DETECTED   Neisseria meningitidis NOT DETECTED NOT DETECTED   Pseudomonas aeruginosa NOT DETECTED NOT DETECTED   Candida albicans NOT DETECTED NOT DETECTED   Candida glabrata NOT DETECTED NOT DETECTED  Candida krusei NOT DETECTED NOT DETECTED   Candida parapsilosis NOT DETECTED NOT DETECTED   Candida tropicalis NOT DETECTED NOT DETECTED  Urinalysis complete, with microscopic- may I&O cath if menses  Result Value Ref Range   Color, Urine YELLOW (A) YELLOW   APPearance TURBID (A) CLEAR   Glucose, UA 50 (A) NEGATIVE mg/dL   Bilirubin Urine NEGATIVE NEGATIVE   Ketones, ur NEGATIVE NEGATIVE mg/dL   Specific Gravity, Urine 1.014 1.005 - 1.030   Hgb urine dipstick 2+ (A) NEGATIVE   pH 5.0 5.0 - 8.0   Protein, ur >500 (A) NEGATIVE mg/dL   Nitrite NEGATIVE NEGATIVE   Leukocytes, UA 3+ (A) NEGATIVE   RBC / HPF TOO NUMEROUS TO COUNT 0 - 5 RBC/hpf   WBC, UA TOO NUMEROUS TO COUNT 0 - 5 WBC/hpf   Bacteria, UA FEW (A) NONE SEEN   Squamous Epithelial / LPF 0-5 (A) NONE SEEN   Trans Epithel, UA 4    WBC Clumps PRESENT    Mucous PRESENT   Basic metabolic panel  Result Value Ref Range   Sodium 130 (L) 135 - 145 mmol/L   Potassium 3.1 (L) 3.5 - 5.1 mmol/L   Chloride 99 (L) 101 - 111 mmol/L   CO2 22 22 - 32 mmol/L   Glucose, Bld 198 (H) 65 - 99 mg/dL   BUN 9 6 - 20 mg/dL   Creatinine, Ser 4.09 0.44 - 1.00 mg/dL   Calcium 8.6 (L) 8.9 - 10.3 mg/dL   GFR calc non Af Amer >60 >60 mL/min   GFR calc Af Amer >60 >60 mL/min   Anion gap 9 5 - 15  CBC    Result Value Ref Range   WBC 16.7 (H) 3.6 - 11.0 K/uL   RBC 4.18 3.80 - 5.20 MIL/uL   Hemoglobin 12.9 12.0 - 16.0 g/dL   HCT 81.1 91.4 - 78.2 %   MCV 88.2 80.0 - 100.0 fL   MCH 30.9 26.0 - 34.0 pg   MCHC 35.0 32.0 - 36.0 g/dL   RDW 95.6 21.3 - 08.6 %   Platelets 244 150 - 440 K/uL  Lactic acid, plasma  Result Value Ref Range   Lactic Acid, Venous 0.6 0.5 - 1.9 mmol/L  Troponin I  Result Value Ref Range   Troponin I 0.20 (HH) <0.03 ng/mL  Magnesium  Result Value Ref Range   Magnesium 1.7 1.7 - 2.4 mg/dL  Hemoglobin V7Q  Result Value Ref Range   Hgb A1c MFr Bld 6.0 4.0 - 6.0 %  Hepatic function panel  Result Value Ref Range   Total Protein 7.0 6.5 - 8.1 g/dL   Albumin 3.5 3.5 - 5.0 g/dL   AST 33 15 - 41 U/L   ALT 20 14 - 54 U/L   Alkaline Phosphatase 53 38 - 126 U/L   Total Bilirubin 0.7 0.3 - 1.2 mg/dL   Bilirubin, Direct 0.1 0.1 - 0.5 mg/dL   Indirect Bilirubin 0.6 0.3 - 0.9 mg/dL  Troponin I  Result Value Ref Range   Troponin I 0.19 (HH) <0.03 ng/mL  Troponin I  Result Value Ref Range   Troponin I 0.12 (HH) <0.03 ng/mL  TSH  Result Value Ref Range   TSH 0.858 0.350 - 4.500 uIU/mL  Lactic acid, plasma  Result Value Ref Range   Lactic Acid, Venous 0.9 0.5 - 1.9 mmol/L  Troponin I  Result Value Ref Range   Troponin I 0.16 (HH) <0.03 ng/mL  Basic metabolic panel  Result  Value Ref Range   Sodium 137 135 - 145 mmol/L   Potassium 3.7 3.5 - 5.1 mmol/L   Chloride 108 101 - 111 mmol/L   CO2 26 22 - 32 mmol/L   Glucose, Bld 115 (H) 65 - 99 mg/dL   BUN 10 6 - 20 mg/dL   Creatinine, Ser 9.38 0.44 - 1.00 mg/dL   Calcium 7.8 (L) 8.9 - 10.3 mg/dL   GFR calc non Af Amer >60 >60 mL/min   GFR calc Af Amer >60 >60 mL/min   Anion gap 3 (L) 5 - 15  CBC  Result Value Ref Range   WBC 16.9 (H) 3.6 - 11.0 K/uL   RBC 3.76 (L) 3.80 - 5.20 MIL/uL   Hemoglobin 11.7 (L) 12.0 - 16.0 g/dL   HCT 18.2 (L) 99.3 - 71.6 %   MCV 89.7 80.0 - 100.0 fL   MCH 31.0 26.0 - 34.0 pg   MCHC  34.6 32.0 - 36.0 g/dL   RDW 96.7 89.3 - 81.0 %   Platelets 203 150 - 440 K/uL  Glucose, capillary  Result Value Ref Range   Glucose-Capillary 99 65 - 99 mg/dL  CBC with Differential/Platelet  Result Value Ref Range   WBC 12.6 (H) 3.6 - 11.0 K/uL   RBC 3.64 (L) 3.80 - 5.20 MIL/uL   Hemoglobin 11.3 (L) 12.0 - 16.0 g/dL   HCT 17.5 (L) 10.2 - 58.5 %   MCV 87.6 80.0 - 100.0 fL   MCH 31.0 26.0 - 34.0 pg   MCHC 35.4 32.0 - 36.0 g/dL   RDW 27.7 82.4 - 23.5 %   Platelets 199 150 - 440 K/uL   Neutrophils Relative % 80 %   Neutro Abs 10.2 (H) 1.4 - 6.5 K/uL   Lymphocytes Relative 11 %   Lymphs Abs 1.3 1.0 - 3.6 K/uL   Monocytes Relative 8 %   Monocytes Absolute 1.0 (H) 0.2 - 0.9 K/uL   Eosinophils Relative 1 %   Eosinophils Absolute 0.1 0 - 0.7 K/uL   Basophils Relative 0 %   Basophils Absolute 0.0 0 - 0.1 K/uL  Basic metabolic panel  Result Value Ref Range   Sodium 136 135 - 145 mmol/L   Potassium 3.3 (L) 3.5 - 5.1 mmol/L   Chloride 109 101 - 111 mmol/L   CO2 23 22 - 32 mmol/L   Glucose, Bld 113 (H) 65 - 99 mg/dL   BUN <5 (L) 6 - 20 mg/dL   Creatinine, Ser 3.61 0.44 - 1.00 mg/dL   Calcium 7.8 (L) 8.9 - 10.3 mg/dL   GFR calc non Af Amer >60 >60 mL/min   GFR calc Af Amer >60 >60 mL/min   Anion gap 4 (L) 5 - 15  Glucose, capillary  Result Value Ref Range   Glucose-Capillary 100 (H) 65 - 99 mg/dL   Comment 1 Notify RN   Basic metabolic panel  Result Value Ref Range   Sodium 136 135 - 145 mmol/L   Potassium 4.1 3.5 - 5.1 mmol/L   Chloride 108 101 - 111 mmol/L   CO2 24 22 - 32 mmol/L   Glucose, Bld 96 65 - 99 mg/dL   BUN 6 6 - 20 mg/dL   Creatinine, Ser 4.43 0.44 - 1.00 mg/dL   Calcium 8.2 (L) 8.9 - 10.3 mg/dL   GFR calc non Af Amer >60 >60 mL/min   GFR calc Af Amer >60 >60 mL/min   Anion gap 4 (L) 5 - 15  CBC with  Differential/Platelet  Result Value Ref Range   WBC 7.3 3.6 - 11.0 K/uL   RBC 3.51 (L) 3.80 - 5.20 MIL/uL   Hemoglobin 11.1 (L) 12.0 - 16.0 g/dL   HCT 16.1  (L) 09.6 - 47.0 %   MCV 88.8 80.0 - 100.0 fL   MCH 31.5 26.0 - 34.0 pg   MCHC 35.5 32.0 - 36.0 g/dL   RDW 04.5 40.9 - 81.1 %   Platelets 217 150 - 440 K/uL   Neutrophils Relative % 71 %   Neutro Abs 5.1 1.4 - 6.5 K/uL   Lymphocytes Relative 19 %   Lymphs Abs 1.4 1.0 - 3.6 K/uL   Monocytes Relative 8 %   Monocytes Absolute 0.6 0.2 - 0.9 K/uL   Eosinophils Relative 2 %   Eosinophils Absolute 0.2 0 - 0.7 K/uL   Basophils Relative 0 %   Basophils Absolute 0.0 0 - 0.1 K/uL  Glucose, capillary  Result Value Ref Range   Glucose-Capillary 94 65 - 99 mg/dL  Pregnancy, urine POC  Result Value Ref Range   Preg Test, Ur NEGATIVE NEGATIVE  ECHOCARDIOGRAM COMPLETE  Result Value Ref Range   Weight 2,332.8 oz   Height 64 in   BP 113/66 mmHg  ++    Assessment & Plan:   Problem List Items Addressed This Visit      Unprioritized   Acute pyelonephritis    Resoved.  Continue present medications.        Relevant Orders   UA/M w/rflx Culture, Routine   Anxiety and depression    Stable, continue present medications.         Other Visit Diagnoses   None.      Follow up plan: Return in about 6 months (around 07/27/2016) for physica.

## 2016-02-01 ENCOUNTER — Other Ambulatory Visit: Payer: Self-pay | Admitting: Unknown Physician Specialty

## 2016-02-01 MED ORDER — AMPHETAMINE-DEXTROAMPHETAMINE 30 MG PO TABS: 30.0000 mg | ORAL_TABLET | Freq: Two times a day (BID) | ORAL | 0 refills | Status: DC

## 2016-02-15 ENCOUNTER — Other Ambulatory Visit: Payer: Self-pay | Admitting: Unknown Physician Specialty

## 2016-02-15 MED ORDER — AMPHETAMINE-DEXTROAMPHETAMINE 30 MG PO TABS: 30.0000 mg | ORAL_TABLET | Freq: Two times a day (BID) | ORAL | 0 refills | Status: DC

## 2016-04-08 ENCOUNTER — Other Ambulatory Visit: Payer: Self-pay | Admitting: Unknown Physician Specialty

## 2016-04-08 MED ORDER — AMPHETAMINE-DEXTROAMPHETAMINE 30 MG PO TABS: 30.0000 mg | ORAL_TABLET | Freq: Two times a day (BID) | ORAL | 0 refills | Status: DC

## 2016-05-06 ENCOUNTER — Other Ambulatory Visit: Payer: Self-pay | Admitting: Unknown Physician Specialty

## 2016-05-06 MED ORDER — AMPHETAMINE-DEXTROAMPHETAMINE 30 MG PO TABS: 30.0000 mg | ORAL_TABLET | Freq: Two times a day (BID) | ORAL | 0 refills | Status: DC

## 2016-05-27 ENCOUNTER — Other Ambulatory Visit: Payer: Self-pay | Admitting: Unknown Physician Specialty

## 2016-05-27 MED ORDER — AMPHETAMINE-DEXTROAMPHETAMINE 30 MG PO TABS: 30.0000 mg | ORAL_TABLET | Freq: Two times a day (BID) | ORAL | 0 refills | Status: DC

## 2016-07-01 ENCOUNTER — Other Ambulatory Visit: Payer: Self-pay | Admitting: Unknown Physician Specialty

## 2016-07-01 MED ORDER — AMPHETAMINE-DEXTROAMPHETAMINE 30 MG PO TABS: 30.0000 mg | ORAL_TABLET | Freq: Two times a day (BID) | ORAL | 0 refills | Status: DC

## 2016-07-05 ENCOUNTER — Other Ambulatory Visit: Payer: Self-pay | Admitting: Unknown Physician Specialty

## 2016-07-18 ENCOUNTER — Other Ambulatory Visit: Payer: Self-pay | Admitting: Unknown Physician Specialty

## 2016-07-18 MED ORDER — AMPHETAMINE-DEXTROAMPHETAMINE 30 MG PO TABS: 30.0000 mg | ORAL_TABLET | Freq: Two times a day (BID) | ORAL | 0 refills | Status: DC

## 2016-07-21 ENCOUNTER — Ambulatory Visit (INDEPENDENT_AMBULATORY_CARE_PROVIDER_SITE_OTHER): Payer: Managed Care, Other (non HMO) | Admitting: Unknown Physician Specialty

## 2016-07-21 ENCOUNTER — Encounter: Payer: Self-pay | Admitting: Unknown Physician Specialty

## 2016-07-21 VITALS — BP 111/73 | HR 98 | Temp 98.6°F | Ht 64.0 in | Wt 156.0 lb

## 2016-07-21 DIAGNOSIS — R5382 Chronic fatigue, unspecified: Secondary | ICD-10-CM

## 2016-07-21 DIAGNOSIS — Z0000 Encounter for general adult medical examination without abnormal findings: Secondary | ICD-10-CM

## 2016-07-21 DIAGNOSIS — G9332 Myalgic encephalomyelitis/chronic fatigue syndrome: Secondary | ICD-10-CM

## 2016-07-21 DIAGNOSIS — F32A Depression, unspecified: Secondary | ICD-10-CM

## 2016-07-21 DIAGNOSIS — F418 Other specified anxiety disorders: Secondary | ICD-10-CM

## 2016-07-21 DIAGNOSIS — F329 Major depressive disorder, single episode, unspecified: Secondary | ICD-10-CM

## 2016-07-21 DIAGNOSIS — F419 Anxiety disorder, unspecified: Secondary | ICD-10-CM

## 2016-07-21 MED ORDER — FLUOXETINE HCL 40 MG PO CAPS: 40.0000 mg | ORAL_CAPSULE | Freq: Every day | ORAL | 1 refills | Status: DC

## 2016-07-21 NOTE — Assessment & Plan Note (Signed)
Stable, continue present medications.   

## 2016-07-21 NOTE — Progress Notes (Signed)
BP 111/73 (BP Location: Left Arm, Patient Position: Sitting, Cuff Size: Large)   Pulse 98   Temp 98.6 F (37 C)   Ht 5\' 4"  (1.626 m)   Wt 156 lb (70.8 kg)   LMP 06/23/2016 (Approximate)   SpO2 95%   BMI 26.78 kg/m    Subjective:    Patient ID: Courtney Davidson, female    DOB: 1975/11/25, 41 y.o.   MRN: [id number]  HPI: Courtney RowanLoretta L Pehrson is a 41 y.o. female  Chief Complaint  Patient presents with  . Annual Exam   Depression Patient with long-standing depression who was started on Adderall and Prozac by a psychiatrist a long time ago and maintained on this dose. Still has bouts of depression that comes and goes but no suicidal plans. She is typically unwilling to talk about her stressors or depression and feels her present treatment is adequate and works for her.   [facility] 07/21/2016 01/25/2016 06/29/2015  Decreased Interest 0 1 1  Down, Depressed, Hopeless 1 1 2   PHQ - 2 Score 1 2 3   Altered sleeping - 2 2  Tired, decreased energy - 0 1  Change in appetite - 2 1  Feeling bad or failure about yourself  - 0 1  Trouble concentrating - 0 0  Moving slowly or fidgety/restless - 0 1  Suicidal thoughts - 0 -  PHQ-9 Score - 6 9   Tobacco Smokes 1 ppd.  Would like to quit smoking but has trouble with motivation to quit.     Relevant past medical, surgical, family and social history reviewed and updated as indicated. Interim medical history since our last visit reviewed. Allergies and medications reviewed and updated.  Review of Systems  Constitutional: Negative.   HENT: Negative.   Eyes: Negative.   Respiratory: Negative.   Cardiovascular: Negative.   Gastrointestinal: Negative.   Endocrine: Negative.   Genitourinary: Negative.   Musculoskeletal: Negative.   Skin: Negative.   Allergic/Immunologic: Negative.   Neurological: Negative.   Hematological: Negative.   Psychiatric/Behavioral: Negative.     Per HPI unless specifically indicated above    Objective:    BP 111/73 (BP Location: Left Arm, Patient Position: Sitting, Cuff Size: Large)   Pulse 98   Temp 98.6 F (37 C)   Ht 5\' 4"  (1.626 m)   Wt 156 lb (70.8 kg)   LMP 06/23/2016 (Approximate)   SpO2 95%   BMI 26.78 kg/m   Wt Readings from Last 3 Encounters:  07/21/16 156 lb (70.8 kg)  01/25/16 144 lb (65.3 kg)  01/15/16 145 lb 12.8 oz (66.1 kg)    Physical Exam  Constitutional: She is oriented to person, place, and time. She appears well-developed and well-nourished.  HENT:  Head: Normocephalic and atraumatic.  Eyes: Pupils are equal, round, and reactive to light. Right eye exhibits no discharge. Left eye exhibits no discharge. No scleral icterus.  Neck: Normal range of motion. Neck supple. Carotid bruit is not present. No thyromegaly present.  Cardiovascular: Normal rate, regular rhythm and normal heart sounds.  Exam reveals no gallop and no friction rub.   No murmur heard. Pulmonary/Chest: Effort normal and breath sounds normal. No respiratory distress. She has no wheezes. She has no rales.  Abdominal: Soft. Bowel sounds are normal. There is no tenderness. There is no rebound.  Genitourinary: No breast swelling, tenderness or discharge.  Musculoskeletal: Normal range of motion.  Lymphadenopathy:    She has no cervical adenopathy.  Neurological: She  is alert and oriented to person, place, and time.  Skin: Skin is warm, dry and intact. No rash noted.  Psychiatric: She has a normal mood and affect. Her speech is normal and behavior is normal. Judgment and thought content normal. Cognition and memory are normal.    Results for orders placed or performed in visit on 01/25/16  UA/M w/rflx Culture, Routine  Result Value Ref Range   Specific Gravity, UA <1.005 (L) 1.005 - 1.030   pH, UA 5.0 5.0 - 7.5   Color, UA Yellow Yellow   Appearance Ur Clear Clear   Leukocytes, UA Negative Negative   Protein, UA Negative Negative/Trace   Glucose, UA Negative Negative   Ketones,  UA Negative Negative   RBC, UA Negative Negative   Bilirubin, UA Negative Negative   Urobilinogen, Ur 0.2 0.2 - 1.0 mg/dL   Nitrite, UA Negative Negative      Assessment & Plan:   Problem List Items Addressed This Visit      Unprioritized   Anxiety and depression    Stable, continue present medications.        CFS (chronic fatigue syndrome)    Stable, continue present medications.         Other Visit Diagnoses    Annual physical exam    -  Primary   Relevant Orders   Comprehensive metabolic panel   CBC with Differential/Platelet   Lipid Panel w/o Chol/HDL Ratio   TSH       Follow up plan: Return in about 6 months (around 01/18/2017) for f/u depression and order chest CT.

## 2016-07-22 LAB — CBC WITH DIFFERENTIAL/PLATELET
BASOS ABS: 0 10*3/uL (ref 0.0–0.2)
Basos: 0 %
EOS (ABSOLUTE): 0.2 10*3/uL (ref 0.0–0.4)
Eos: 1 %
Hematocrit: 41.1 % (ref 34.0–46.6)
Hemoglobin: 13.7 g/dL (ref 11.1–15.9)
IMMATURE GRANS (ABS): 0 10*3/uL (ref 0.0–0.1)
IMMATURE GRANULOCYTES: 0 %
LYMPHS: 15 %
Lymphocytes Absolute: 2.4 10*3/uL (ref 0.7–3.1)
MCH: 29 pg (ref 26.6–33.0)
MCHC: 33.3 g/dL (ref 31.5–35.7)
MCV: 87 fL (ref 79–97)
MONOS ABS: 0.7 10*3/uL (ref 0.1–0.9)
Monocytes: 4 %
NEUTROS ABS: 12.9 10*3/uL — AB (ref 1.4–7.0)
NEUTROS PCT: 80 %
PLATELETS: 348 10*3/uL (ref 150–379)
RBC: 4.73 x10E6/uL (ref 3.77–5.28)
RDW: 12.7 % (ref 12.3–15.4)
WBC: 16.3 10*3/uL — ABNORMAL HIGH (ref 3.4–10.8)

## 2016-07-22 LAB — COMPREHENSIVE METABOLIC PANEL
ALK PHOS: 56 IU/L (ref 39–117)
ALT: 14 IU/L (ref 0–32)
AST: 19 IU/L (ref 0–40)
Albumin/Globulin Ratio: 1.4 (ref 1.2–2.2)
Albumin: 3.8 g/dL (ref 3.5–5.5)
BILIRUBIN TOTAL: 0.3 mg/dL (ref 0.0–1.2)
BUN/Creatinine Ratio: 14 (ref 9–23)
BUN: 11 mg/dL (ref 6–24)
CHLORIDE: 101 mmol/L (ref 96–106)
CO2: 23 mmol/L (ref 18–29)
Calcium: 9.3 mg/dL (ref 8.7–10.2)
Creatinine, Ser: 0.8 mg/dL (ref 0.57–1.00)
GFR calc Af Amer: 106 mL/min/{1.73_m2} (ref >59)
GFR calc non Af Amer: 92 mL/min/{1.73_m2} (ref >59)
Globulin, Total: 2.7 g/dL (ref 1.5–4.5)
Glucose: 78 mg/dL (ref 65–99)
Potassium: 4.6 mmol/L (ref 3.5–5.2)
Sodium: 138 mmol/L (ref 134–144)
Total Protein: 6.5 g/dL (ref 6.0–8.5)

## 2016-07-22 LAB — TSH: TSH: 0.776 u[IU]/mL (ref 0.450–4.500)

## 2016-07-22 LAB — LIPID PANEL W/O CHOL/HDL RATIO
CHOLESTEROL TOTAL: 201 mg/dL — AB (ref 100–199)
HDL: 36 mg/dL — ABNORMAL LOW (ref >39)
LDL Calculated: 137 mg/dL — ABNORMAL HIGH (ref 0–99)
Triglycerides: 141 mg/dL (ref 0–149)
VLDL CHOLESTEROL CAL: 28 mg/dL (ref 5–40)

## 2016-07-23 ENCOUNTER — Telehealth: Payer: Self-pay | Admitting: Unknown Physician Specialty

## 2016-07-23 DIAGNOSIS — D72825 Bandemia: Secondary | ICD-10-CM

## 2016-07-23 NOTE — Telephone Encounter (Signed)
Update order on phone note

## 2016-07-23 NOTE — Telephone Encounter (Signed)
Discussed with pt elevated WBC.  Not ill at the time.  Will repeat tomorrow.  If elevated, refer to hematology

## 2016-08-13 ENCOUNTER — Other Ambulatory Visit: Payer: Self-pay | Admitting: Unknown Physician Specialty

## 2016-08-13 MED ORDER — AMPHETAMINE-DEXTROAMPHETAMINE 30 MG PO TABS: 30.0000 mg | ORAL_TABLET | Freq: Two times a day (BID) | ORAL | 0 refills | Status: DC

## 2016-09-03 ENCOUNTER — Ambulatory Visit (INDEPENDENT_AMBULATORY_CARE_PROVIDER_SITE_OTHER): Payer: Managed Care, Other (non HMO) | Admitting: Unknown Physician Specialty

## 2016-09-03 ENCOUNTER — Encounter: Payer: Self-pay | Admitting: Unknown Physician Specialty

## 2016-09-03 VITALS — BP 125/76 | HR 89 | Temp 99.1°F | Wt 157.8 lb

## 2016-09-03 DIAGNOSIS — F329 Major depressive disorder, single episode, unspecified: Secondary | ICD-10-CM

## 2016-09-03 DIAGNOSIS — F17200 Nicotine dependence, unspecified, uncomplicated: Secondary | ICD-10-CM | POA: Diagnosis not present

## 2016-09-03 DIAGNOSIS — F32A Depression, unspecified: Secondary | ICD-10-CM

## 2016-09-03 DIAGNOSIS — F418 Other specified anxiety disorders: Secondary | ICD-10-CM

## 2016-09-03 DIAGNOSIS — F419 Anxiety disorder, unspecified: Principal | ICD-10-CM

## 2016-09-03 DIAGNOSIS — Z87891 Personal history of nicotine dependence: Secondary | ICD-10-CM | POA: Insufficient documentation

## 2016-09-03 DIAGNOSIS — F17210 Nicotine dependence, cigarettes, uncomplicated: Secondary | ICD-10-CM | POA: Insufficient documentation

## 2016-09-03 MED ORDER — FLUOXETINE HCL 40 MG PO CAPS: 40.0000 mg | ORAL_CAPSULE | Freq: Every day | ORAL | 1 refills | Status: DC

## 2016-09-03 MED ORDER — LITHIUM CARBONATE 300 MG PO TABS: 300.0000 mg | ORAL_TABLET | Freq: Two times a day (BID) | ORAL | 1 refills | Status: DC

## 2016-09-03 NOTE — Progress Notes (Signed)
BP 125/76 (BP Location: Left Arm, Patient Position: Sitting, Cuff Size: Normal)   Pulse 89   Temp 99.1 F (37.3 C)   Wt 157 lb 12.8 oz (71.6 kg)   SpO2 98%   BMI 27.09 kg/m    Subjective:    Patient ID: Courtney Davidson, female    DOB: June 30, 1976, 41 y.o.   MRN: [id number]  HPI: Courtney Davidson is a [age] y.o. female  Chief Complaint  Patient presents with  . Depression    pt states she has had a lot going on for the past 3 weeks, states she was sent home from work and cannot return without seeing a provider first   Pt states she was sent home from work and given until Monday off.  She states she was having uncontrollable crying spells.  She feels that having off till Monday helps.  She wants to go back at that time but admits that work contributes to her depressive symptoms.  She needs a note by me stating she was seen.    Dep[facility] 09/03/2016 07/21/2016 01/25/2016 06/29/2015  Decreased Interest 3 0 1 1  Down, Depressed, Hopeless 3 1 1 2   PHQ - 2 Score 6 1 2 3   Altered sleeping 2 - 2 2  Tired, decreased energy 2 - 0 1  Change in appetite 2 - 2 1  Feeling bad or failure about yourself  2 - 0 1  Trouble concentrating 2 - 0 0  Moving slowly or fidgety/restless 1 - 0 1  Suicidal thoughts 0 - 0 -  PHQ-9 Score 17 - 6 9   Pt has an extensive history of depression and has episodic flares.  In the past, she has refused any additional treatment for these flares but today, she does agree to see behavioral health. She has been on Prozac and Adderall for quite some years originally from a psychiatrist and we have continued.  I've reviewed her chart from 5-6 years ago and patient remembers and I noted that she had done well with Lithium but lost to f/u.  She would like to try Lithium again.    Relevant past medical, surgical, family and social history reviewed and updated as indicated. Interim medical history since our last visit reviewed. Allergies and medications reviewed  and updated.  Review of Systems  Per HPI unless specifically indicated above     Objective:    BP 125/76 (BP Location: Left Arm, Patient Position: Sitting, Cuff Size: Normal)   Pulse 89   Temp 99.1 F (37.3 C)   Wt 157 lb 12.8 oz (71.6 kg)   SpO2 98%   BMI 27.09 kg/m   Wt Readings from Last 3 Encounters:  09/03/16 157 lb 12.8 oz (71.6 kg)  07/21/16 156 lb (70.8 kg)  01/25/16 144 lb (65.3 kg)    Physical Exam  Constitutional: She is oriented to person, place, and time. She appears well-developed and well-nourished. No distress.  HENT:  Head: Normocephalic and atraumatic.  Eyes: Conjunctivae and lids are normal. Right eye exhibits no discharge. Left eye exhibits no discharge. No scleral icterus.  Neck: Normal range of motion. Neck supple. No JVD present. Carotid bruit is not present.  Cardiovascular: Normal rate, regular rhythm and normal heart sounds.   Pulmonary/Chest: Effort normal and breath sounds normal.  Abdominal: Normal appearance. There is no splenomegaly or hepatomegaly.  Musculoskeletal: Normal range of motion.  Neurological: She is alert and oriented to person, place, and time.  Skin: Skin is warm, dry and intact. No rash noted. No pallor.  Psychiatric: She has a normal mood and affect. Her behavior is normal. Judgment and thought content normal.      Assessment & Plan:   Problem List Items Addressed This Visit      Unprioritized   Anxiety and depression - Primary    Worse today.  Probable bipolar with episodic severe bouts of depression.  Will add Lithium as pt did well with that in the past.  Will also refer to behavioral health.  Continue present dose of Fluoxetine.        Relevant Orders   Ambulatory referral to Psychiatry   Smoking    Encouraged to quit          Follow up plan: Return in about 4 weeks (around 10/01/2016).

## 2016-09-03 NOTE — Assessment & Plan Note (Addendum)
Worse today.  Probable bipolar with episodic severe bouts of depression.  Will add Lithium as pt did well with that in the past.  Will also refer to behavioral health.  Continue present dose of Fluoxetine.

## 2016-09-03 NOTE — Assessment & Plan Note (Signed)
Encouraged to quit. 

## 2016-09-09 ENCOUNTER — Other Ambulatory Visit: Payer: Self-pay | Admitting: Unknown Physician Specialty

## 2016-09-09 MED ORDER — AMPHETAMINE-DEXTROAMPHETAMINE 30 MG PO TABS: 30.0000 mg | ORAL_TABLET | Freq: Two times a day (BID) | ORAL | 0 refills | Status: DC

## 2016-09-24 ENCOUNTER — Ambulatory Visit: Payer: Managed Care, Other (non HMO) | Admitting: Unknown Physician Specialty

## 2016-09-30 ENCOUNTER — Ambulatory Visit: Payer: Managed Care, Other (non HMO) | Admitting: Unknown Physician Specialty

## 2016-10-06 ENCOUNTER — Ambulatory Visit (INDEPENDENT_AMBULATORY_CARE_PROVIDER_SITE_OTHER): Payer: Managed Care, Other (non HMO) | Admitting: Unknown Physician Specialty

## 2016-10-06 ENCOUNTER — Encounter: Payer: Self-pay | Admitting: Unknown Physician Specialty

## 2016-10-06 DIAGNOSIS — F322 Major depressive disorder, single episode, severe without psychotic features: Secondary | ICD-10-CM | POA: Diagnosis not present

## 2016-10-06 DIAGNOSIS — Z79899 Other long term (current) drug therapy: Secondary | ICD-10-CM

## 2016-10-06 MED ORDER — LITHIUM CARBONATE 300 MG PO TABS: 300.0000 mg | ORAL_TABLET | Freq: Two times a day (BID) | ORAL | 1 refills | Status: DC

## 2016-10-06 NOTE — Progress Notes (Signed)
BP 118/74 (BP Location: Left Arm, Patient Position: Sitting, Cuff Size: Normal)   Pulse 91   Temp 98.5 F (36.9 C)   Wt 161 lb (73 kg)   SpO2 95%   BMI 27.64 kg/m    Subjective:    Patient ID: Courtney Davidson, female    DOB: Feb 08, 1976, 41 y.o.   MRN: [id number]  HPI: Courtney Davidson is a 41 y.o. female  Chief Complaint  Patient presents with  . Depression    4 week f/up   Pt is here to folllow up on the severe depression she struggled with 4 weeks ago.  We started her on Lithium as an add on to Fluoxetine and Adderal (which she had been on for years) as it seemed to work for her years ago. She is doing very well on her Lithium therapy.  She reports complete remission of her depression  Depression screen [facility] 2/9 10/06/2016 09/03/2016 07/21/2016 01/25/2016 06/29/2015  Decreased Interest 0 3 0 1 1  Down, Depressed, Hopeless 0 PHQ - 2 Score 0 Altered sleeping - 2 - 2 2  Tired, decreased energy - 2 - 0 1  Change in appetite - 2 - 2 1  Feeling bad or failure about yourself  - 2 - 0 1  Trouble concentrating - 2 - 0 0  Moving slowly or fidgety/restless - 1 - 0 1  Suicidal thoughts - 0 - 0 -  PHQ-9 Score - 17 - 6 9       Relevant past medical, surgical, family and social history reviewed and updated as indicated. Interim medical history since our last visit reviewed. Allergies and medications reviewed and updated.  Review of Systems  Constitutional: Negative.   HENT: Negative.   Eyes: Negative.   Respiratory: Negative.   Cardiovascular: Negative.   Gastrointestinal: Negative.   Endocrine: Negative.   Genitourinary: Negative.   Musculoskeletal: Negative.   Skin: Negative.   Allergic/Immunologic: Negative.   Neurological: Negative.   Hematological: Negative.   Psychiatric/Behavioral: Negative.     Per HPI unless specifically indicated above     Objective:    BP 118/74 (BP Location: Left Arm, Patient Position: Sitting, Cuff Size: Normal)   Pulse  91   Temp 98.5 F (36.9 C)   Wt 161 lb (73 kg)   SpO2 95%   BMI 27.64 kg/m   Wt Readings from Last 3 Encounters:  10/06/16 161 lb (73 kg)  09/03/16 157 lb 12.8 oz (71.6 kg)  07/21/16 156 lb (70.8 kg)    Physical Exam  Constitutional: She is oriented to person, place, and time. She appears well-developed and well-nourished. No distress.  HENT:  Head: Normocephalic and atraumatic.  Eyes: Conjunctivae and lids are normal. Right eye exhibits no discharge. Left eye exhibits no discharge. No scleral icterus.  Neck: Normal range of motion. Neck supple. No JVD present. Carotid bruit is not present.  Cardiovascular: Normal rate, regular rhythm and normal heart sounds.   Pulmonary/Chest: Effort normal and breath sounds normal.  Abdominal: Normal appearance. There is no splenomegaly or hepatomegaly.  Musculoskeletal: Normal range of motion.  Neurological: She is alert and oriented to person, place, and time.  Skin: Skin is warm, dry and intact. No rash noted. No pallor.  Psychiatric: She has a normal mood and affect. Her behavior is normal. Judgment and thought content normal.    Results for orders placed or performed in visit on 07/21/16  Comprehensive metabolic panel  Result Value Ref Range   Glucose 78 65 - 99 mg/dL   BUN 11 6 - 24 mg/dL   Creatinine, Ser 9.14 0.57 - 1.00 mg/dL   GFR calc non Af Amer 92 >59 mL/min/1.73   GFR calc Af Amer 106 >59 mL/min/1.73   BUN/Creatinine Ratio 14 9 - 23   Sodium 138 134 - 144 mmol/L   Potassium 4.6 3.5 - 5.2 mmol/L   Chloride 101 96 - 106 mmol/L   CO2 23 18 - 29 mmol/L   Calcium 9.3 8.7 - 10.2 mg/dL   Total Protein 6.5 6.0 - 8.5 g/dL   Albumin 3.8 3.5 - 5.5 g/dL   Globulin, Total 2.7 1.5 - 4.5 g/dL   Albumin/Globulin Ratio 1.4 1.2 - 2.2   Bilirubin Total 0.3 0.0 - 1.2 mg/dL   Alkaline Phosphatase 56 39 - 117 IU/L   AST 19 0 - 40 IU/L   ALT 14 0 - 32 IU/L  CBC with Differential/Platelet  Result Value Ref Range   WBC 16.3 (H) 3.4 - 10.8  x10E3/uL   RBC 4.73 3.77 - 5.28 x10E6/uL   Hemoglobin 13.7 11.1 - 15.9 g/dL   Hematocrit 78.2 95.6 - 46.6 %   MCV 87 79 - 97 fL   MCH 29.0 26.6 - 33.0 pg   MCHC 33.3 31.5 - 35.7 g/dL   RDW 21.3 08.6 - 57.8 %   Platelets 348 150 - 379 x10E3/uL   Neutrophils 80 Not Estab. %   Lymphs 15 Not Estab. %   Monocytes 4 Not Estab. %   Eos 1 Not Estab. %   Basos 0 Not Estab. %   Neutrophils Absolute 12.9 (H) 1.4 - 7.0 x10E3/uL   Lymphocytes Absolute 2.4 0.7 - 3.1 x10E3/uL   Monocytes Absolute 0.7 0.1 - 0.9 x10E3/uL   EOS (ABSOLUTE) 0.2 0.0 - 0.4 x10E3/uL   Basophils Absolute 0.0 0.0 - 0.2 x10E3/uL   Immature Granulocytes 0 Not Estab. %   Immature Grans (Abs) 0.0 0.0 - 0.1 x10E3/uL  Lipid Panel w/o Chol/HDL Ratio  Result Value Ref Range   Cholesterol, Total 201 (H) 100 - 199 mg/dL   Triglycerides 469 0 - 149 mg/dL   HDL 36 (L) >62 mg/dL   VLDL Cholesterol Cal 28 5 - 40 mg/dL   LDL Calculated 952 (H) 0 - 99 mg/dL  TSH  Result Value Ref Range   TSH 0.776 0.450 - 4.500 uIU/mL      Assessment & Plan:   Problem List Items Addressed This Visit      Unprioritized   Depression, major, single episode, severe (HCC)    Stable with the addition of Lithium.  Pt ed on dehydration.  Check Lithium levels.        Relevant Orders   Lithium level   Comprehensive metabolic panel   Medication management    Stable, continue present medications.            Follow up plan: Return in about 3 months (around 01/05/2017).

## 2016-10-06 NOTE — Assessment & Plan Note (Signed)
Stable with the addition of Lithium.  Pt ed on dehydration.  Check Lithium levels.

## 2016-10-06 NOTE — Assessment & Plan Note (Signed)
Stable, continue present medications.   

## 2016-10-07 LAB — COMPREHENSIVE METABOLIC PANEL
A/G RATIO: 1.5 (ref 1.2–2.2)
ALK PHOS: 53 IU/L (ref 39–117)
ALT: 13 IU/L (ref 0–32)
AST: 17 IU/L (ref 0–40)
Albumin: 3.9 g/dL (ref 3.5–5.5)
BUN / CREAT RATIO: 13 (ref 9–23)
BUN: 9 mg/dL (ref 6–24)
CHLORIDE: 100 mmol/L (ref 96–106)
CO2: 27 mmol/L (ref 18–29)
Calcium: 9 mg/dL (ref 8.7–10.2)
Creatinine, Ser: 0.71 mg/dL (ref 0.57–1.00)
GFR calc non Af Amer: 105 mL/min/{1.73_m2} (ref >59)
GFR, EST AFRICAN AMERICAN: 121 mL/min/{1.73_m2} (ref >59)
Globulin, Total: 2.6 g/dL (ref 1.5–4.5)
Glucose: 94 mg/dL (ref 65–99)
POTASSIUM: 3.9 mmol/L (ref 3.5–5.2)
Sodium: 139 mmol/L (ref 134–144)
TOTAL PROTEIN: 6.5 g/dL (ref 6.0–8.5)

## 2016-10-07 LAB — LITHIUM LEVEL: LITHIUM LVL: 0.5 mmol/L — AB (ref 0.6–1.2)

## 2016-10-07 NOTE — Progress Notes (Signed)
Notified pt by mychart

## 2016-10-15 ENCOUNTER — Other Ambulatory Visit: Payer: Self-pay | Admitting: Unknown Physician Specialty

## 2016-10-15 MED ORDER — AMPHETAMINE-DEXTROAMPHETAMINE 30 MG PO TABS: 30.0000 mg | ORAL_TABLET | Freq: Two times a day (BID) | ORAL | 0 refills | Status: DC

## 2016-10-28 ENCOUNTER — Other Ambulatory Visit: Payer: Self-pay | Admitting: Unknown Physician Specialty

## 2016-10-28 DIAGNOSIS — Z5181 Encounter for therapeutic drug level monitoring: Secondary | ICD-10-CM

## 2016-10-28 MED ORDER — AMPHETAMINE-DEXTROAMPHETAMINE 30 MG PO TABS: 30.0000 mg | ORAL_TABLET | Freq: Two times a day (BID) | ORAL | 0 refills | Status: DC

## 2016-11-02 ENCOUNTER — Other Ambulatory Visit: Payer: Self-pay | Admitting: Unknown Physician Specialty

## 2016-11-20 ENCOUNTER — Other Ambulatory Visit: Payer: Managed Care, Other (non HMO)

## 2016-11-20 DIAGNOSIS — D72825 Bandemia: Secondary | ICD-10-CM

## 2016-11-20 DIAGNOSIS — Z5181 Encounter for therapeutic drug level monitoring: Secondary | ICD-10-CM

## 2016-11-21 LAB — CBC WITH DIFFERENTIAL/PLATELET
BASOS ABS: 0 10*3/uL (ref 0.0–0.2)
Basos: 0 %
EOS (ABSOLUTE): 0.2 10*3/uL (ref 0.0–0.4)
Eos: 2 %
Hematocrit: 39.5 % (ref 34.0–46.6)
Hemoglobin: 13.4 g/dL (ref 11.1–15.9)
IMMATURE GRANS (ABS): 0 10*3/uL (ref 0.0–0.1)
Immature Granulocytes: 0 %
LYMPHS: 26 %
Lymphocytes Absolute: 2.5 10*3/uL (ref 0.7–3.1)
MCH: 30 pg (ref 26.6–33.0)
MCHC: 33.9 g/dL (ref 31.5–35.7)
MCV: 88 fL (ref 79–97)
Monocytes Absolute: 0.3 10*3/uL (ref 0.1–0.9)
Monocytes: 4 %
NEUTROS ABS: 6.7 10*3/uL (ref 1.4–7.0)
NEUTROS PCT: 68 %
PLATELETS: 347 10*3/uL (ref 150–379)
RBC: 4.47 x10E6/uL (ref 3.77–5.28)
RDW: 13.1 % (ref 12.3–15.4)
WBC: 9.8 10*3/uL (ref 3.4–10.8)

## 2016-11-24 LAB — URINE DRUGS OF ABUSE SCREEN W ALC, ROUTINE (REF LAB)
Barbiturate Quant, Ur: NEGATIVE ng/mL
Benzodiazepine Quant, Ur: NEGATIVE ng/mL
Cannabinoid Quant, Ur: NEGATIVE ng/mL
Cocaine (Metab.): NEGATIVE ng/mL
Ethanol U, Quan: NEGATIVE %
Methadone Screen, Urine: NEGATIVE ng/mL
Opiate Quant, Ur: NEGATIVE ng/mL
PCP QUANT UR: NEGATIVE ng/mL
PROPOXYPHENE: NEGATIVE ng/mL

## 2016-11-24 LAB — AMPHETAMINE CONF, UR
AMPHETAMINES, URINE: POSITIVE — AB
Amphetamine GC/MS Conf: >4000 ng/mL
Amphetamine: POSITIVE — AB
Methamphetamine: NEGATIVE

## 2016-12-15 ENCOUNTER — Other Ambulatory Visit: Payer: Self-pay | Admitting: Unknown Physician Specialty

## 2016-12-15 MED ORDER — AMPHETAMINE-DEXTROAMPHETAMINE 30 MG PO TABS: 30.0000 mg | ORAL_TABLET | Freq: Two times a day (BID) | ORAL | 0 refills | Status: DC

## 2016-12-25 ENCOUNTER — Emergency Department
Admission: EM | Admit: 2016-12-25 | Discharge: 2016-12-25 | Disposition: A | Discharge status: Home / Self Care | Payer: Managed Care, Other (non HMO) | Attending: Emergency Medicine | Admitting: Emergency Medicine

## 2016-12-25 ENCOUNTER — Encounter: Payer: Self-pay | Admitting: Emergency Medicine

## 2016-12-25 ENCOUNTER — Emergency Department: Payer: Managed Care, Other (non HMO)

## 2016-12-25 DIAGNOSIS — Z79899 Other long term (current) drug therapy: Secondary | ICD-10-CM | POA: Insufficient documentation

## 2016-12-25 DIAGNOSIS — F17210 Nicotine dependence, cigarettes, uncomplicated: Secondary | ICD-10-CM | POA: Diagnosis not present

## 2016-12-25 DIAGNOSIS — R079 Chest pain, unspecified: Secondary | ICD-10-CM | POA: Insufficient documentation

## 2016-12-25 LAB — BASIC METABOLIC PANEL
Anion gap: 4 — ABNORMAL LOW (ref 5–15)
BUN: 10 mg/dL (ref 6–20)
CALCIUM: 9 mg/dL (ref 8.9–10.3)
CO2: 26 mmol/L (ref 22–32)
Chloride: 108 mmol/L (ref 101–111)
Creatinine, Ser: 0.82 mg/dL (ref 0.44–1.00)
GFR calc Af Amer: >60 mL/min (ref >60)
GFR calc non Af Amer: >60 mL/min (ref >60)
GLUCOSE: 129 mg/dL — AB (ref 65–99)
Potassium: 3.9 mmol/L (ref 3.5–5.1)
Sodium: 138 mmol/L (ref 135–145)

## 2016-12-25 LAB — CBC
HCT: 40 % (ref 35.0–47.0)
Hemoglobin: 14 g/dL (ref 12.0–16.0)
MCH: 29.9 pg (ref 26.0–34.0)
MCHC: 34.9 g/dL (ref 32.0–36.0)
MCV: 85.7 fL (ref 80.0–100.0)
Platelets: 288 10*3/uL (ref 150–440)
RBC: 4.67 MIL/uL (ref 3.80–5.20)
RDW: 13.1 % (ref 11.5–14.5)
WBC: 13.1 10*3/uL — ABNORMAL HIGH (ref 3.6–11.0)

## 2016-12-25 LAB — HEPATIC FUNCTION PANEL
ALT: 13 U/L — ABNORMAL LOW (ref 14–54)
AST: 20 U/L (ref 15–41)
Albumin: 3.6 g/dL (ref 3.5–5.0)
Alkaline Phosphatase: 45 U/L (ref 38–126)
BILIRUBIN TOTAL: 0.6 mg/dL (ref 0.3–1.2)
Total Protein: 6.7 g/dL (ref 6.5–8.1)

## 2016-12-25 LAB — LIPASE, BLOOD: Lipase: 31 U/L (ref 11–51)

## 2016-12-25 LAB — TROPONIN I

## 2016-12-25 NOTE — ED Provider Notes (Signed)
[facility] Emergency Department Provider Note  ____________________________________________   I have reviewed the triage vital signs and the nursing notes.   HISTORY  Chief Complaint Chest Pain   History limited by: Not Limited   HPI Courtney Davidson is a 41 y.o. female who presents to the emergency department today because of concerns for chest pain. The chest pain happened roughly 5 hours prior to my evaluation. It started suddenly.  Is located in the center chest. She describes it as squeezing. It was severe for about 45 minutes before gradually easing off. At the time of my evaluation the patient states she is pain-free. She did feel slightly hot flush with the pain. No significant shortness of breath. She denies similar symptoms in the past. Denies any unusual ingestion or activity last night. States she has had no recent illness.   Past Medical History:  Diagnosis Date  . Anxiety   . Carpal tunnel syndrome   . CFS (chronic fatigue syndrome)   . Depression   . Fatigue   . Hypoglycemia     Patient Active Problem List   Diagnosis Date Noted  . Depression, major, single episode, severe (HCC) 10/06/2016  . Medication management 10/06/2016  . Smoking 09/03/2016  . Elevated troponin 01/15/2016  . Elevated WBC count 01/15/2016  . Hyperglycemia 01/15/2016  . Lung nodule < 6cm on CT 01/15/2016  . Cholelithiasis 01/15/2016  . Fatigue 06/18/2015  . Carpal tunnel syndrome 06/18/2015  . Anxiety and depression 06/18/2015  . CFS (chronic fatigue syndrome) 06/18/2015    Past Surgical History:  Procedure Laterality Date  . CARPAL TUNNEL RELEASE Bilateral   . CESAREAN SECTION      Prior to Admission medications   Medication Sig Start Date End Date Taking? Authorizing Provider  amphetamine-dextroamphetamine (ADDERALL) 30 MG tablet Take 1 tablet by mouth 2 (two) times daily. 12/15/16   Gabriel CirriWicker, Cheryl, NP  FLUoxetine (PROZAC) 40 MG capsule Take 1 capsule  (40 mg total) by mouth daily. 09/03/16   Gabriel CirriWicker, Cheryl, NP  lithium 300 MG tablet Take 1 tablet (300 mg total) by mouth 2 (two) times daily. 10/06/16   Gabriel CirriWicker, Cheryl, NP  lithium 300 MG tablet TAKE 1 TABLET BY MOUTH TWICE DAILY 11/03/16   Gabriel CirriWicker, Cheryl, NP    Allergies Patient has no known allergies.  Family History  Problem Relation Age of Onset  . Cancer Mother        lung  . Diabetes Mother   . Stroke Mother   . Hypertension Mother     Social History Social History  Substance Use Topics  . Smoking status: Current Every Day Smoker    Packs/day: 1.00    Types: Cigarettes  . Smokeless tobacco: Never Used  . Alcohol use No    Review of Systems Constitutional: No fever/chills Eyes: No visual changes. ENT: No sore throat. Cardiovascular: Positive chest pain. Respiratory: Denies shortness of breath. Gastrointestinal: No abdominal pain.  No nausea, no vomiting.  No diarrhea.   Genitourinary: Negative for dysuria. Musculoskeletal: Negative for back pain. Skin: Negative for rash. Neurological: Negative for headaches, focal weakness or numbness.  ____________________________________________   PHYSICAL EXAM:  VITAL SIGNS: ED Triage Vitals [12/25/16 0539]  Enc Vitals Group     BP 112/75     Pulse Rate 82     Resp 20     Temp 98 F (36.7 C)     Temp Source Oral     SpO2 99 %  Weight 160 lb (72.6 kg)     Height 5\' 4"  (1.626 m)     Head Circumference      Peak Flow      Pain Score 4    Constitutional: Alert and oriented. Well appearing and in no distress. Eyes: Conjunctivae are normal.  ENT   Head: Normocephalic and atraumatic.   Nose: No congestion/rhinnorhea.   Mouth/Throat: Mucous membranes are moist.   Neck: No stridor. Hematological/Lymphatic/Immunilogical: No cervical lymphadenopathy. Cardiovascular: Normal rate, regular rhythm.  No murmurs, rubs, or gallops. Respiratory: Normal respiratory effort without tachypnea nor retractions. Breath  sounds are clear and equal bilaterally. No wheezes/rales/rhonchi. Gastrointestinal: Soft and non tender. No rebound. No guarding.  Genitourinary: Deferred Musculoskeletal: Normal range of motion in all extremities. No lower extremity edema. Neurologic:  Normal speech and language. No gross focal neurologic deficits are appreciated.  Skin:  Skin is warm, dry and intact. No rash noted. Psychiatric: Mood and affect are normal. Speech and behavior are normal. Patient exhibits appropriate insight and judgment.  ____________________________________________    LABS (pertinent positives/negatives)  Labs Reviewed  BASIC METABOLIC PANEL - Abnormal; Notable for the following:       Result Value   Glucose, Bld 129 (*)    Anion gap 4 (*)    All other components within normal limits  CBC - Abnormal; Notable for the following:    WBC 13.1 (*)    All other components within normal limits  HEPATIC FUNCTION PANEL - Abnormal; Notable for the following:    ALT 13 (*)    Bilirubin, Direct <0.1 (*)    All other components within normal limits  TROPONIN I  LIPASE, BLOOD  TROPONIN I     ____________________________________________   EKG  I, Phineas Semen, attending physician, personally viewed and interpreted this EKG  EKG Time: 0542 Rate: 85 Rhythm: normal sinus rhythm Axis: normal Intervals: qtc 464 QRS: narrow ST changes: no st elevation Impression: normal ekg   ____________________________________________    RADIOLOGY  CXR IMPRESSION:  No active cardiopulmonary disease.    ____________________________________________   PROCEDURES  Procedures  ____________________________________________   INITIAL IMPRESSION / ASSESSMENT AND PLAN / ED COURSE  Pertinent labs & imaging results that were available during my care of the patient were reviewed by me and considered in my medical decision making (see chart for details).  Patient presented to the emergency department  today because of concerns for chest pain. The time examination the chest pain had resolved. The patient had 2 sets of negative troponins. This point I do think patient is safe for discharge.  ____________________________________________   FINAL CLINICAL IMPRESSION(S) / ED DIAGNOSES  Final diagnoses:  Nonspecific chest pain     Note: This dictation was prepared with Dragon dictation. Any transcriptional errors that result from this process are unintentional     Phineas Semen, MD 12/25/16 1048

## 2016-12-25 NOTE — ED Triage Notes (Addendum)
Patient ambulatory to triage with steady gait, without difficulty or distress noted; pt reports mid CP, radiating into throat since 330am; denies any accomp symptoms; denies hx of same; pt reports earlier this week had noted some difficulty with swallowing

## 2016-12-25 NOTE — Discharge Instructions (Signed)
Please seek medical attention for any high fevers, chest pain, shortness of breath, change in behavior, persistent vomiting, bloody stool or any other new or concerning symptoms.  

## 2016-12-25 NOTE — ED Notes (Signed)
Bill RN aware of patient placement in room.

## 2017-01-09 ENCOUNTER — Other Ambulatory Visit: Payer: Self-pay | Admitting: Unknown Physician Specialty

## 2017-01-09 MED ORDER — AMPHETAMINE-DEXTROAMPHETAMINE 30 MG PO TABS: 30.0000 mg | ORAL_TABLET | Freq: Two times a day (BID) | ORAL | 0 refills | Status: DC

## 2017-01-20 ENCOUNTER — Encounter: Payer: Self-pay | Admitting: Unknown Physician Specialty

## 2017-01-20 ENCOUNTER — Ambulatory Visit (INDEPENDENT_AMBULATORY_CARE_PROVIDER_SITE_OTHER): Payer: Managed Care, Other (non HMO) | Admitting: Unknown Physician Specialty

## 2017-01-20 VITALS — BP 120/78 | HR 73 | Temp 98.5°F | Ht 65.2 in | Wt 161.5 lb

## 2017-01-20 DIAGNOSIS — R911 Solitary pulmonary nodule: Secondary | ICD-10-CM

## 2017-01-20 DIAGNOSIS — Z79899 Other long term (current) drug therapy: Secondary | ICD-10-CM | POA: Diagnosis not present

## 2017-01-20 DIAGNOSIS — Z5181 Encounter for therapeutic drug level monitoring: Secondary | ICD-10-CM | POA: Diagnosis not present

## 2017-01-20 DIAGNOSIS — F322 Major depressive disorder, single episode, severe without psychotic features: Secondary | ICD-10-CM | POA: Diagnosis not present

## 2017-01-20 NOTE — Assessment & Plan Note (Addendum)
<  6 mm.  Noted on CT scan last year when in hospital for stones.  Order non contrast CT

## 2017-01-20 NOTE — Assessment & Plan Note (Signed)
Check CMP and Lithium levels

## 2017-01-20 NOTE — Progress Notes (Signed)
BP 120/78   Pulse 73   Temp 98.5 F (36.9 C)   Ht 5' 5.2" (1.656 m)   Wt 161 lb 8 oz (73.3 kg)   SpO2 96%   BMI 26.71 kg/m    Subjective:    Patient ID: Courtney Davidson, female    DOB: 08/30/1975, 41 y.o.   MRN: [id number]  HPI: Courtney Davidson is a 41 y.o. female  Chief Complaint  Patient presents with  . Depression    3 month f/up   Depression Pt is doing well with present medications, particularly after the Lithium.  Also taking Prozac and Adderall, medications for over 20 years.   Depression screen [facility] 2/9 01/20/2017 10/06/2016 09/03/2016 07/21/2016 01/25/2016  Decreased Interest 0 0 3 0 1  Down, Depressed, Hopeless 0 0 3 1 1   PHQ - 2 Score 0 0 6 1 2   Altered sleeping 0 - 2 - 2  Tired, decreased energy 1 - 2 - 0  Change in appetite 1 - 2 - 2  Feeling bad or failure about yourself  0 - 2 - 0  Trouble concentrating 0 - 2 - 0  Moving slowly or fidgety/restless 0 - 1 - 0  Suicidal thoughts 0 - 0 - 0  PHQ-9 Score 2 - 17 - 6   Asking about a chest CT due to a nodule found last year while in the ER for kidney stones.  Chart reviewed and nodule noted.  High risk due to smoking history so Chest CT without contrast is needed.    Relevant past medical, surgical, family and social history reviewed and updated as indicated. Interim medical history since our last visit reviewed. Allergies and medications reviewed and updated.  Review of Systems  Per HPI unless specifically indicated above     Objective:    BP 120/78   Pulse 73   Temp 98.5 F (36.9 C)   Ht 5' 5.2" (1.656 m)   Wt 161 lb 8 oz (73.3 kg)   SpO2 96%   BMI 26.71 kg/m   Wt Readings from Last 3 Encounters:  01/20/17 161 lb 8 oz (73.3 kg)  12/25/16 160 lb (72.6 kg)  10/06/16 161 lb (73 kg)    Physical Exam  Constitutional: She is oriented to person, place, and time. She appears well-developed and well-nourished. No distress.  HENT:  Head: Normocephalic and atraumatic.  Eyes: Conjunctivae and lids are  normal. Right eye exhibits no discharge. Left eye exhibits no discharge. No scleral icterus.  Neck: Normal range of motion. Neck supple. No JVD present. Carotid bruit is not present.  Cardiovascular: Normal rate, regular rhythm and normal heart sounds.   Pulmonary/Chest: Effort normal and breath sounds normal.  Abdominal: Normal appearance. There is no splenomegaly or hepatomegaly.  Musculoskeletal: Normal range of motion.  Neurological: She is alert and oriented to person, place, and time.  Skin: Skin is warm, dry and intact. No rash noted. No pallor.  Psychiatric: She has a normal mood and affect. Her behavior is normal. Judgment and thought content normal.    Results for orders placed or performed during the hospital encounter of 12/25/16  Basic metabolic panel  Result Value Ref Range   Sodium 138 135 - 145 mmol/L   Potassium 3.9 3.5 - 5.1 mmol/L   Chloride 108 101 - 111 mmol/L   CO2 26 22 - 32 mmol/L   Glucose, Bld 129 (H) 65 - 99 mg/dL   BUN 10 6 - 20 mg/dL  Creatinine, Ser 0.82 0.44 - 1.00 mg/dL   Calcium 9.0 8.9 - 16.110.3 mg/dL   GFR calc non Af Amer >60 >60 mL/min   GFR calc Af Amer >60 >60 mL/min   Anion gap 4 (L) 5 - 15  CBC  Result Value Ref Range   WBC 13.1 (H) 3.6 - 11.0 K/uL   RBC 4.67 3.80 - 5.20 MIL/uL   Hemoglobin 14.0 12.0 - 16.0 g/dL   HCT 09.640.0 04.535.0 - 40.947.0 %   MCV 85.7 80.0 - 100.0 fL   MCH 29.9 26.0 - 34.0 pg   MCHC 34.9 32.0 - 36.0 g/dL   RDW 81.113.1 91.411.5 - 78.214.5 %   Platelets 288 150 - 440 K/uL  Troponin I  Result Value Ref Range   Troponin I <0.03 <0.03 ng/mL  Lipase, blood  Result Value Ref Range   Lipase 31 11 - 51 U/L  Hepatic function panel  Result Value Ref Range   Total Protein 6.7 6.5 - 8.1 g/dL   Albumin 3.6 3.5 - 5.0 g/dL   AST 20 15 - 41 U/L   ALT 13 (L) 14 - 54 U/L   Alkaline Phosphatase 45 38 - 126 U/L   Total Bilirubin 0.6 0.3 - 1.2 mg/dL   Bilirubin, Direct <9.5<0.1 (L) 0.1 - 0.5 mg/dL   Indirect Bilirubin NOT CALCULATED 0.3 - 0.9 mg/dL    Troponin I  Result Value Ref Range   Troponin I <0.03 <0.03 ng/mL      Assessment & Plan:   Problem List Items Addressed This Visit      Unprioritized   Depression, major, single episode, severe (HCC)    Stable, continue present medications.        Relevant Orders   Comprehensive metabolic panel   Lithium level   Pulmonary nodule    <6 mm.  Noted on CT scan last year when in hospital for stones.  Order non contrast CT      Relevant Orders   CT Chest Wo Contrast    Other Visit Diagnoses    Medication monitoring encounter    -  Primary   Relevant Orders   Lithium level   Pregnancy, urine       Follow up plan: Return in about 3 months (around 04/22/2017) for physical.

## 2017-01-20 NOTE — Assessment & Plan Note (Signed)
Stable, continue present medications.   

## 2017-01-21 LAB — COMPREHENSIVE METABOLIC PANEL
ALT: 10 IU/L (ref 0–32)
AST: 15 IU/L (ref 0–40)
Albumin/Globulin Ratio: 1.6 (ref 1.2–2.2)
Albumin: 3.9 g/dL (ref 3.5–5.5)
Alkaline Phosphatase: 49 IU/L (ref 39–117)
BUN/Creatinine Ratio: 9 (ref 9–23)
BUN: 7 mg/dL (ref 6–24)
Bilirubin Total: 0.3 mg/dL (ref 0.0–1.2)
CALCIUM: 9.4 mg/dL (ref 8.7–10.2)
CO2: 22 mmol/L (ref 20–29)
CREATININE: 0.8 mg/dL (ref 0.57–1.00)
Chloride: 106 mmol/L (ref 96–106)
GFR, EST AFRICAN AMERICAN: 105 mL/min/{1.73_m2} (ref >59)
GFR, EST NON AFRICAN AMERICAN: 91 mL/min/{1.73_m2} (ref >59)
GLUCOSE: 88 mg/dL (ref 65–99)
Globulin, Total: 2.5 g/dL (ref 1.5–4.5)
Potassium: 4.9 mmol/L (ref 3.5–5.2)
Sodium: 142 mmol/L (ref 134–144)
TOTAL PROTEIN: 6.4 g/dL (ref 6.0–8.5)

## 2017-01-21 LAB — LITHIUM LEVEL: Lithium Lvl: 0.2 mmol/L — ABNORMAL LOW (ref 0.6–1.2)

## 2017-01-22 LAB — PREGNANCY, URINE: PREG TEST UR: NEGATIVE

## 2017-01-26 ENCOUNTER — Other Ambulatory Visit: Payer: Self-pay | Admitting: Unknown Physician Specialty

## 2017-01-29 ENCOUNTER — Ambulatory Visit: Payer: Managed Care, Other (non HMO)

## 2017-02-03 ENCOUNTER — Ambulatory Visit
Admission: RE | Admit: 2017-02-03 | Discharge: 2017-02-03 | Disposition: A | Discharge status: Home / Self Care | Payer: Managed Care, Other (non HMO) | Source: Ambulatory Visit | Attending: Unknown Physician Specialty | Admitting: Unknown Physician Specialty

## 2017-02-03 DIAGNOSIS — R918 Other nonspecific abnormal finding of lung field: Secondary | ICD-10-CM | POA: Diagnosis not present

## 2017-02-03 DIAGNOSIS — J439 Emphysema, unspecified: Secondary | ICD-10-CM | POA: Diagnosis not present

## 2017-02-03 DIAGNOSIS — I700 Atherosclerosis of aorta: Secondary | ICD-10-CM | POA: Insufficient documentation

## 2017-02-03 DIAGNOSIS — K8020 Calculus of gallbladder without cholecystitis without obstruction: Secondary | ICD-10-CM | POA: Diagnosis not present

## 2017-02-03 DIAGNOSIS — R911 Solitary pulmonary nodule: Secondary | ICD-10-CM

## 2017-02-04 ENCOUNTER — Other Ambulatory Visit: Payer: Self-pay | Admitting: Unknown Physician Specialty

## 2017-02-04 DIAGNOSIS — R918 Other nonspecific abnormal finding of lung field: Secondary | ICD-10-CM

## 2017-02-04 DIAGNOSIS — R911 Solitary pulmonary nodule: Secondary | ICD-10-CM

## 2017-02-04 MED ORDER — AMPHETAMINE-DEXTROAMPHETAMINE 30 MG PO TABS: 30.0000 mg | ORAL_TABLET | Freq: Two times a day (BID) | ORAL | 0 refills | Status: DC

## 2017-02-04 NOTE — Assessment & Plan Note (Signed)
Chest CT due in one year

## 2017-02-20 ENCOUNTER — Other Ambulatory Visit: Payer: Self-pay | Admitting: Unknown Physician Specialty

## 2017-02-20 MED ORDER — AMPHETAMINE-DEXTROAMPHETAMINE 30 MG PO TABS: 30.0000 mg | ORAL_TABLET | Freq: Two times a day (BID) | ORAL | 0 refills | Status: DC

## 2017-03-27 ENCOUNTER — Other Ambulatory Visit: Payer: Self-pay | Admitting: Unknown Physician Specialty

## 2017-03-27 MED ORDER — AMPHETAMINE-DEXTROAMPHETAMINE 30 MG PO TABS
30.0000 mg | ORAL_TABLET | Freq: Two times a day (BID) | ORAL | 0 refills | Status: DC
Start: 2017-03-27 — End: 2017-05-04

## 2017-04-24 ENCOUNTER — Ambulatory Visit (INDEPENDENT_AMBULATORY_CARE_PROVIDER_SITE_OTHER): Payer: Managed Care, Other (non HMO) | Admitting: Unknown Physician Specialty

## 2017-04-24 ENCOUNTER — Encounter: Payer: Self-pay | Admitting: Unknown Physician Specialty

## 2017-04-24 VITALS — BP 107/73 | HR 91 | Temp 98.7°F | Resp 18 | Wt 161.2 lb

## 2017-04-24 DIAGNOSIS — F17200 Nicotine dependence, unspecified, uncomplicated: Secondary | ICD-10-CM

## 2017-04-24 DIAGNOSIS — R0602 Shortness of breath: Secondary | ICD-10-CM

## 2017-04-24 DIAGNOSIS — F322 Major depressive disorder, single episode, severe without psychotic features: Secondary | ICD-10-CM | POA: Diagnosis not present

## 2017-04-24 NOTE — Progress Notes (Signed)
BP 107/73 (BP Location: Left Arm, Patient Position: Sitting)   Pulse 91   Temp 98.7 F (37.1 C) (Oral)   Resp 18   Wt 161 lb 3.2 oz (73.1 kg)   BMI 26.66 kg/m    Subjective:    Patient ID: Courtney Davidson, female    DOB: 1976/02/12, 41 y.o.   MRN: [id number]  HPI: Courtney Davidson is a 41 y.o. female  No chief complaint on file.  Depression I'm doing really good.  On triple therapy with Lithium, Adderal, and Fluoxetine.  Adderall long term treatment for many years and started by a previous provider.   [facility] 04/24/2017 01/20/2017 10/06/2016 09/03/2016 07/21/2016  Decreased Interest 0 0 0 3 0  Down, Depressed, Hopeless 0 0 0 3 1  PHQ - 2 Score 0 0 0 6 1  Altered sleeping 0 0 - 2 -  Tired, decreased energy 0 1 - 2 -  Change in appetite 0 1 - 2 -  Feeling bad or failure about yourself  0 0 - 2 -  Trouble concentrating 0 0 - 2 -  Moving slowly or fidgety/restless 0 0 - 1 -  Suicidal thoughts 0 0 - 0 -  PHQ-9 Score 0 2 - 17 -   SOB Can't walk as fast as normal.  Has been smoking for quite some time.  Smokes about 1 ppd  Relevant past medical, surgical, family and social history reviewed and updated as indicated. Interim medical history since our last visit reviewed. Allergies and medications reviewed and updated.  Review of Systems  Per HPI unless specifically indicated above     Objective:    BP 107/73 (BP Location: Left Arm, Patient Position: Sitting)   Pulse 91   Temp 98.7 F (37.1 C) (Oral)   Resp 18   Wt 161 lb 3.2 oz (73.1 kg)   BMI 26.66 kg/m   Wt Readings from Last 3 Encounters:  04/24/17 161 lb 3.2 oz (73.1 kg)  01/20/17 161 lb 8 oz (73.3 kg)  12/25/16 160 lb (72.6 kg)    Physical Exam  Constitutional: She is oriented to person, place, and time. She appears well-developed and well-nourished. No distress.  HENT:  Head: Normocephalic and atraumatic.  Eyes: Conjunctivae and lids are normal. Right eye exhibits no discharge. Left eye  exhibits no discharge. No scleral icterus.  Neck: Normal range of motion. Neck supple. No JVD present. Carotid bruit is not present.  Cardiovascular: Normal rate, regular rhythm and normal heart sounds.   Pulmonary/Chest: Effort normal and breath sounds normal.  Abdominal: Normal appearance. There is no splenomegaly or hepatomegaly.  Musculoskeletal: Normal range of motion.  Neurological: She is alert and oriented to person, place, and time.  Skin: Skin is warm, dry and intact. No rash noted. No pallor.  Psychiatric: She has a normal mood and affect. Her behavior is normal. Judgment and thought content normal.    Results for orders placed or performed in visit on 01/20/17  Comprehensive metabolic panel  Result Value Ref Range   Glucose 88 65 - 99 mg/dL   BUN 7 6 - 24 mg/dL   Creatinine, Ser 7.820.80 0.57 - 1.00 mg/dL   GFR calc non Af Amer 91 >59 mL/min/1.73   GFR calc Af Amer 105 >59 mL/min/1.73   BUN/Creatinine Ratio 9 9 - 23   Sodium 142 134 - 144 mmol/L   Potassium 4.9 3.5 - 5.2 mmol/L   Chloride 106 96 - 106 mmol/L  CO2 22 20 - 29 mmol/L   Calcium 9.4 8.7 - 10.2 mg/dL   Total Protein 6.4 6.0 - 8.5 g/dL   Albumin 3.9 3.5 - 5.5 g/dL   Globulin, Total 2.5 1.5 - 4.5 g/dL   Albumin/Globulin Ratio 1.6 1.2 - 2.2   Bilirubin Total 0.3 0.0 - 1.2 mg/dL   Alkaline Phosphatase 49 39 - 117 IU/L   AST 15 0 - 40 IU/L   ALT 10 0 - 32 IU/L  Lithium level  Result Value Ref Range   Lithium Lvl 0.2 (L) 0.6 - 1.2 mmol/L  Pregnancy, urine  Result Value Ref Range   Preg Test, Ur Negative Negative      Assessment & Plan:   Problem List Items Addressed This Visit      Unprioritized   Depression, major, single episode, severe (HCC)    Pt currently in remission with current treatment.  Picks up monthly rx of Adderly      Smoking     I have recommended absolute tobacco cessation. I have discussed various options available for assistance with tobacco cessation including over the counter  methods (Nicotine gum, patch and lozenges). We also discussed prescription options (Chantix, Nicotine Inhaler / Nasal Spray). The patient is not interested in pursuing any prescription tobacco cessation options at this time.        Other Visit Diagnoses    SOB (shortness of breath)    -  Primary   Normal spirometry but lung age 79.  Encouraged to quit smoking   Relevant Orders   Spirometry with Graph (Completed)       Follow up plan: Return in about 3 months (around 07/25/2017) for physical.

## 2017-04-24 NOTE — Assessment & Plan Note (Signed)
I have recommended absolute tobacco cessation. I have discussed various options available for assistance with tobacco cessation including over the counter methods (Nicotine gum, patch and lozenges). We also discussed prescription options (Chantix, Nicotine Inhaler / Nasal Spray). The patient is not interested in pursuing any prescription tobacco cessation options at this time.  

## 2017-04-24 NOTE — Assessment & Plan Note (Signed)
Pt currently in remission with current treatment.  Picks up monthly rx of Adderly

## 2017-05-04 ENCOUNTER — Other Ambulatory Visit: Payer: Self-pay | Admitting: Unknown Physician Specialty

## 2017-05-04 MED ORDER — AMPHETAMINE-DEXTROAMPHETAMINE 30 MG PO TABS: 30.0000 mg | ORAL_TABLET | Freq: Two times a day (BID) | ORAL | 0 refills | Status: DC

## 2017-05-19 ENCOUNTER — Other Ambulatory Visit: Payer: Self-pay | Admitting: Unknown Physician Specialty

## 2017-06-05 ENCOUNTER — Other Ambulatory Visit: Payer: Self-pay | Admitting: Unknown Physician Specialty

## 2017-06-05 MED ORDER — AMPHETAMINE-DEXTROAMPHETAMINE 30 MG PO TABS
30.0000 mg | ORAL_TABLET | Freq: Two times a day (BID) | ORAL | 0 refills | Status: DC
Start: 2017-06-05 — End: 2017-06-05

## 2017-06-05 MED ORDER — AMPHETAMINE-DEXTROAMPHETAMINE 30 MG PO TABS
30.0000 mg | ORAL_TABLET | Freq: Two times a day (BID) | ORAL | 0 refills | Status: DC
Start: 2017-06-05 — End: 2017-07-06

## 2017-07-06 ENCOUNTER — Other Ambulatory Visit: Payer: Self-pay | Admitting: Unknown Physician Specialty

## 2017-07-06 MED ORDER — AMPHETAMINE-DEXTROAMPHETAMINE 30 MG PO TABS: 30.0000 mg | ORAL_TABLET | Freq: Two times a day (BID) | ORAL | 0 refills | Status: DC

## 2017-07-31 ENCOUNTER — Encounter: Payer: Managed Care, Other (non HMO) | Admitting: Unknown Physician Specialty

## 2017-08-03 ENCOUNTER — Other Ambulatory Visit: Payer: Self-pay | Admitting: Family Medicine

## 2017-08-03 MED ORDER — AMPHETAMINE-DEXTROAMPHETAMINE 30 MG PO TABS: 30.0000 mg | ORAL_TABLET | Freq: Two times a day (BID) | ORAL | 0 refills | Status: DC

## 2017-08-28 ENCOUNTER — Other Ambulatory Visit: Payer: Self-pay | Admitting: Unknown Physician Specialty

## 2017-08-28 MED ORDER — AMPHETAMINE-DEXTROAMPHETAMINE 30 MG PO TABS: 30.0000 mg | ORAL_TABLET | Freq: Two times a day (BID) | ORAL | 0 refills | Status: DC

## 2017-09-08 ENCOUNTER — Other Ambulatory Visit: Payer: Self-pay | Admitting: Unknown Physician Specialty

## 2017-09-25 ENCOUNTER — Other Ambulatory Visit: Payer: Self-pay | Admitting: Unknown Physician Specialty

## 2017-09-25 MED ORDER — AMPHETAMINE-DEXTROAMPHETAMINE 30 MG PO TABS: 30.0000 mg | ORAL_TABLET | Freq: Two times a day (BID) | ORAL | 0 refills | Status: DC

## 2017-10-02 ENCOUNTER — Other Ambulatory Visit: Payer: Self-pay | Admitting: Unknown Physician Specialty

## 2017-10-02 MED ORDER — AMPHETAMINE-DEXTROAMPHETAMINE 30 MG PO TABS: 30.0000 mg | ORAL_TABLET | Freq: Two times a day (BID) | ORAL | 0 refills | Status: DC

## 2017-10-23 ENCOUNTER — Ambulatory Visit: Payer: Managed Care, Other (non HMO) | Admitting: Unknown Physician Specialty

## 2017-10-23 ENCOUNTER — Encounter: Payer: Self-pay | Admitting: Unknown Physician Specialty

## 2017-10-23 DIAGNOSIS — R634 Abnormal weight loss: Secondary | ICD-10-CM | POA: Diagnosis not present

## 2017-10-23 DIAGNOSIS — F322 Major depressive disorder, single episode, severe without psychotic features: Secondary | ICD-10-CM | POA: Diagnosis not present

## 2017-10-23 NOTE — Assessment & Plan Note (Signed)
Getting yearly CTs.  Recheck in 1 month for further evaluation.  Labs in 1 month if continue weight loss.  Refusing labs today

## 2017-10-23 NOTE — Progress Notes (Signed)
BP 107/72   Pulse 91   Temp 98.3 F (36.8 C) (Oral)   Ht 5' 5.2" (1.656 m)   Wt 152 lb 3.2 oz (69 kg)   SpO2 97%   BMI 25.17 kg/m    Subjective:    Patient ID: Courtney Davidson, female    DOB: 11/06/1975, 42 y.o.   MRN: [id number]  HPI: Courtney Davidson is a 42 y.o. female  Chief Complaint  Patient presents with  . Depression   Depression Pt is here for f/u of depression and use of controlled substance (adderall) she has been using for a long time to help control her depression.  States her mood "comes and goes" but she is feels she is doing OK.  States only taking Lithium once a day Depression screen [facility] 2/9 10/23/2017 04/24/2017 01/20/2017 10/06/2016 09/03/2016  Decreased Interest 1 0 0 0 3  Down, Depressed, Hopeless 1 0 0 0 3  PHQ - 2 Score 2 0 0 0 6  Altered sleeping 1 0 0 - 2  Tired, decreased energy 2 0 1 - 2  Change in appetite 2 0 1 - 2  Feeling bad or failure about yourself  1 0 0 - 2  Trouble concentrating 1 0 0 - 2  Moving slowly or fidgety/restless 0 0 0 - 1  Suicidal thoughts 0 0 0 - 0  PHQ-9 Score 9 0 2 - 17   Weight loss Didn't know she was losing weight.  No appetite for quite some time.     Relevant past medical, surgical, family and social history reviewed and updated as indicated. Interim medical history since our last visit reviewed. Allergies and medications reviewed and updated.  Review of Systems  Per HPI unless specifically indicated above     Objective:    BP 107/72   Pulse 91   Temp 98.3 F (36.8 C) (Oral)   Ht 5' 5.2" (1.656 m)   Wt 152 lb 3.2 oz (69 kg)   SpO2 97%   BMI 25.17 kg/m   Wt Readings from Last 3 Encounters:  10/23/17 152 lb 3.2 oz (69 kg)  04/24/17 161 lb 3.2 oz (73.1 kg)  01/20/17 161 lb 8 oz (73.3 kg)    Physical Exam  Constitutional: She is oriented to person, place, and time. She appears well-developed and well-nourished. No distress.  HENT:  Head: Normocephalic and atraumatic.  Eyes: Conjunctivae and lids  are normal. Right eye exhibits no discharge. Left eye exhibits no discharge. No scleral icterus.  Neck: Normal range of motion. Neck supple. No JVD present. Carotid bruit is not present.  Cardiovascular: Normal rate, regular rhythm and normal heart sounds.  Pulmonary/Chest: Effort normal and breath sounds normal.  Abdominal: Normal appearance. There is no splenomegaly or hepatomegaly.  Musculoskeletal: Normal range of motion.  Neurological: She is alert and oriented to person, place, and time.  Skin: Skin is warm, dry and intact. No rash noted. No pallor.  Psychiatric: She has a normal mood and affect. Her behavior is normal. Judgment and thought content normal.    Results for orders placed or performed in visit on 01/20/17  Comprehensive metabolic panel  Result Value Ref Range   Glucose 88 65 - 99 mg/dL   BUN 7 6 - 24 mg/dL   Creatinine, Ser 4.09 0.57 - 1.00 mg/dL   GFR calc non Af Amer 91 >59 mL/min/1.73   GFR calc Af Amer 105 >59 mL/min/1.73   BUN/Creatinine Ratio 9 9 - 23  Sodium 142 134 - 144 mmol/L   Potassium 4.9 3.5 - 5.2 mmol/L   Chloride 106 96 - 106 mmol/L   CO2 22 20 - 29 mmol/L   Calcium 9.4 8.7 - 10.2 mg/dL   Total Protein 6.4 6.0 - 8.5 g/dL   Albumin 3.9 3.5 - 5.5 g/dL   Globulin, Total 2.5 1.5 - 4.5 g/dL   Albumin/Globulin Ratio 1.6 1.2 - 2.2   Bilirubin Total 0.3 0.0 - 1.2 mg/dL   Alkaline Phosphatase 49 39 - 117 IU/L   AST 15 0 - 40 IU/L   ALT 10 0 - 32 IU/L  Lithium level  Result Value Ref Range   Lithium Lvl 0.2 (L) 0.6 - 1.2 mmol/L  Pregnancy, urine  Result Value Ref Range   Preg Test, Ur Negative Negative      Assessment & Plan:   Problem List Items Addressed This Visit      Unprioritized   Depression, major, single episode, severe (HCC)    Depression not to goal.  Will increase Lithium to 300 mg BID      Weight loss    Getting yearly CTs.  Recheck in 1 month for further evaluation.  Labs in 1 month if continue weight loss.  Refusing labs  today          Follow up plan: Return in about 1 month (around 11/20/2017).

## 2017-10-23 NOTE — Assessment & Plan Note (Signed)
Depression not to goal.  Will increase Lithium to 300 mg BID

## 2017-11-06 ENCOUNTER — Other Ambulatory Visit: Payer: Self-pay | Admitting: Unknown Physician Specialty

## 2017-11-20 ENCOUNTER — Ambulatory Visit: Payer: Managed Care, Other (non HMO) | Admitting: Unknown Physician Specialty

## 2017-11-24 ENCOUNTER — Other Ambulatory Visit: Payer: Self-pay | Admitting: Unknown Physician Specialty

## 2017-11-24 ENCOUNTER — Telehealth: Payer: Self-pay | Admitting: Unknown Physician Specialty

## 2017-11-24 MED ORDER — AMPHETAMINE-DEXTROAMPHETAMINE 30 MG PO TABS: 30.0000 mg | ORAL_TABLET | Freq: Two times a day (BID) | ORAL | 0 refills | Status: DC

## 2017-11-24 NOTE — Progress Notes (Signed)
Refilled Adderall.  Needs a f/u appt

## 2017-11-24 NOTE — Telephone Encounter (Signed)
Please ask her to get this at the pharmacy sent.  I can't sent a controlled substance to multiple pharmacies.  We can change the pharmacy at the next visit when we write a new controlled substance agreement

## 2017-11-24 NOTE — Telephone Encounter (Signed)
Copied from CRM [id number]. Topic: Quick Communication - Rx Refill/Question >> Nov 24, 2017  4:28 PM Landry Mellow wrote: Medication: amphetamine-dextroamphetamine (ADDERALL) 30 MG tablet  Has the patient contacted their pharmacy? No. (Agent: If no, request that the patient contact the pharmacy for the refill.) (Agent: If yes, when and what did the pharmacy advise?)  Preferred Pharmacy (with phone number or street name): [facility]   Agent: Please be advised that RX refills may take up to 3 business days. We ask that you follow-up with your pharmacy.   Was sent to [facility], pt is asking for this to be sent to [facility]

## 2017-12-22 ENCOUNTER — Encounter: Payer: Self-pay | Admitting: Unknown Physician Specialty

## 2017-12-22 ENCOUNTER — Other Ambulatory Visit: Payer: Self-pay

## 2017-12-22 NOTE — Telephone Encounter (Signed)
Patient last seen 10/23/17

## 2017-12-22 NOTE — Telephone Encounter (Signed)
Pt. Calling about prescription -  Set up appt for her on Friday 6/28 for medication check Asking to have prescription sent in.

## 2017-12-22 NOTE — Telephone Encounter (Signed)
Overdue for appointment. Please schedule appointment and I will send enough medicine to get her to it.

## 2017-12-23 MED ORDER — AMPHETAMINE-DEXTROAMPHETAMINE 30 MG PO TABS: 30.0000 mg | ORAL_TABLET | Freq: Two times a day (BID) | ORAL | 0 refills | Status: DC

## 2017-12-23 NOTE — Telephone Encounter (Signed)
Routing back to provider 

## 2017-12-24 ENCOUNTER — Ambulatory Visit: Payer: Managed Care, Other (non HMO) | Admitting: Family Medicine

## 2017-12-25 ENCOUNTER — Encounter: Payer: Self-pay | Admitting: Unknown Physician Specialty

## 2017-12-25 ENCOUNTER — Ambulatory Visit (INDEPENDENT_AMBULATORY_CARE_PROVIDER_SITE_OTHER): Payer: Managed Care, Other (non HMO) | Admitting: Unknown Physician Specialty

## 2017-12-25 VITALS — BP 104/71 | HR 86 | Temp 98.4°F | Ht 64.0 in | Wt 154.2 lb

## 2017-12-25 DIAGNOSIS — Z5181 Encounter for therapeutic drug level monitoring: Secondary | ICD-10-CM

## 2017-12-25 DIAGNOSIS — F322 Major depressive disorder, single episode, severe without psychotic features: Secondary | ICD-10-CM

## 2017-12-25 MED ORDER — AMPHETAMINE-DEXTROAMPHETAMINE 30 MG PO TABS: 30.0000 mg | ORAL_TABLET | Freq: Every day | ORAL | 0 refills | Status: DC

## 2017-12-25 MED ORDER — AMPHETAMINE-DEXTROAMPHETAMINE 30 MG PO TABS: 30.0000 mg | ORAL_TABLET | Freq: Two times a day (BID) | ORAL | 0 refills | Status: DC

## 2017-12-25 NOTE — Assessment & Plan Note (Addendum)
Stable, continue present medications. Refilled Addarall with fill dates of today, 7/26 and 8/23.  Recheck beginning of September.  Check Lithium levels today

## 2017-12-25 NOTE — Progress Notes (Signed)
BP 104/71 (BP Location: Left Arm, Patient Position: Sitting, Cuff Size: Normal)   Pulse 86   Temp 98.4 F (36.9 C) (Oral)   Ht 5\' 4"  (1.626 m)   Wt 154 lb 3.2 oz (69.9 kg)   SpO2 98%   BMI 26.47 kg/m    Subjective:    Patient ID: Courtney Davidson, female    DOB: 02/12/1976, 42 y.o.   MRN: [id number]  HPI: Courtney Davidson is a 42 y.o. female  Chief Complaint  Patient presents with  . Medication Refill    Adderrall  . Follow-up   Pt is here for refill of her Adderall.  She has been on this for years and initially prescribed by a psychiatrist.  She has been stabel of Adderall, Fluoxetine and Lithium.  Lithium levels have been well below lower limits of therapeutic range.  However, often forgetting her evening dose.  Today she is frustrated with our new system of e prescribing controlled substances and has been without medication even though I called it in 2 days ago.  Pharmacy claims they never got the medication.    [facility] 12/25/2017 10/23/2017 04/24/2017 01/20/2017 10/06/2016  Decreased Interest 1 1 0 0 0  Down, Depressed, Hopeless 0 1 0 0 0  PHQ - 2 Score 1 2 0 0 0  Altered sleeping 1 1 0 0 -  Tired, decreased energy 1 2 0 1 -  Change in appetite 0 2 0 1 -  Feeling bad or failure about yourself  0 1 0 0 -  Trouble concentrating 0 1 0 0 -  Moving slowly or fidgety/restless 1 0 0 0 -  Suicidal thoughts 0 0 0 0 -  PHQ-9 Score 4 9 0 2 -     Relevant past medical, surgical, family and social history reviewed and updated as indicated. Interim medical history since our last visit reviewed. Allergies and medications reviewed and updated.  Review of Systems  Per HPI unless specifically indicated above     Objective:    BP 104/71 (BP Location: Left Arm, Patient Position: Sitting, Cuff Size: Normal)   Pulse 86   Temp 98.4 F (36.9 C) (Oral)   Ht 5\' 4"  (1.626 m)   Wt 154 lb 3.2 oz (69.9 kg)   SpO2 98%   BMI 26.47 kg/m   Wt Readings from Last 3  Encounters:  12/25/17 154 lb 3.2 oz (69.9 kg)  10/23/17 152 lb 3.2 oz (69 kg)  04/24/17 161 lb 3.2 oz (73.1 kg)    Physical Exam  Constitutional: She is oriented to person, place, and time. She appears well-developed and well-nourished. No distress.  HENT:  Head: Normocephalic and atraumatic.  Eyes: Conjunctivae and lids are normal. Right eye exhibits no discharge. Left eye exhibits no discharge. No scleral icterus.  Neck: Normal range of motion. Neck supple. No JVD present. Carotid bruit is not present.  Cardiovascular: Normal rate, regular rhythm and normal heart sounds.  Pulmonary/Chest: Effort normal and breath sounds normal.  Abdominal: Normal appearance. There is no splenomegaly or hepatomegaly.  Musculoskeletal: Normal range of motion.  Neurological: She is alert and oriented to person, place, and time.  Skin: Skin is warm, dry and intact. No rash noted. No pallor.  Psychiatric: She has a normal mood and affect. Her behavior is normal. Judgment and thought content normal.    Results for orders placed or performed in visit on 01/20/17  Comprehensive metabolic panel  Result Value Ref Range  Glucose 88 65 - 99 mg/dL   BUN 7 6 - 24 mg/dL   Creatinine, Ser 5.64 0.57 - 1.00 mg/dL   GFR calc non Af Amer 91 >59 mL/min/1.73   GFR calc Af Amer 105 >59 mL/min/1.73   BUN/Creatinine Ratio 9 9 - 23   Sodium 142 134 - 144 mmol/L   Potassium 4.9 3.5 - 5.2 mmol/L   Chloride 106 96 - 106 mmol/L   CO2 22 20 - 29 mmol/L   Calcium 9.4 8.7 - 10.2 mg/dL   Total Protein 6.4 6.0 - 8.5 g/dL   Albumin 3.9 3.5 - 5.5 g/dL   Globulin, Total 2.5 1.5 - 4.5 g/dL   Albumin/Globulin Ratio 1.6 1.2 - 2.2   Bilirubin Total 0.3 0.0 - 1.2 mg/dL   Alkaline Phosphatase 49 39 - 117 IU/L   AST 15 0 - 40 IU/L   ALT 10 0 - 32 IU/L  Lithium level  Result Value Ref Range   Lithium Lvl 0.2 (L) 0.6 - 1.2 mmol/L  Pregnancy, urine  Result Value Ref Range   Preg Test, Ur Negative Negative      Assessment &  Plan:   Problem List Items Addressed This Visit      Unprioritized   Depression, major, single episode, severe (HCC)    Stable, continue present medications. Refilled Addarall with fill dates of today, 7/26 and 8/23.  Recheck beginning of September.  Check Lithium levels today        Other Visit Diagnoses    Medication monitoring encounter    -  Primary   Relevant Orders   Lithium level   CBC with Differential/Platelet   Comprehensive metabolic panel       Follow up plan: Return in about 2 months (around 03/05/2018).

## 2017-12-26 LAB — CBC WITH DIFFERENTIAL/PLATELET
BASOS ABS: 0 10*3/uL (ref 0.0–0.2)
BASOS: 0 %
EOS (ABSOLUTE): 0.2 10*3/uL (ref 0.0–0.4)
Eos: 2 %
Hematocrit: 40.8 % (ref 34.0–46.6)
Hemoglobin: 13.7 g/dL (ref 11.1–15.9)
IMMATURE GRANS (ABS): 0 10*3/uL (ref 0.0–0.1)
IMMATURE GRANULOCYTES: 0 %
LYMPHS: 27 %
Lymphocytes Absolute: 2.6 10*3/uL (ref 0.7–3.1)
MCH: 29.5 pg (ref 26.6–33.0)
MCHC: 33.6 g/dL (ref 31.5–35.7)
MCV: 88 fL (ref 79–97)
Monocytes Absolute: 0.4 10*3/uL (ref 0.1–0.9)
Monocytes: 5 %
NEUTROS PCT: 66 %
Neutrophils Absolute: 6.4 10*3/uL (ref 1.4–7.0)
PLATELETS: 354 10*3/uL (ref 150–450)
RBC: 4.65 x10E6/uL (ref 3.77–5.28)
RDW: 13.3 % (ref 12.3–15.4)
WBC: 9.6 10*3/uL (ref 3.4–10.8)

## 2017-12-26 LAB — COMPREHENSIVE METABOLIC PANEL
ALT: 13 IU/L (ref 0–32)
AST: 16 IU/L (ref 0–40)
Albumin/Globulin Ratio: 1.6 (ref 1.2–2.2)
Albumin: 4.1 g/dL (ref 3.5–5.5)
Alkaline Phosphatase: 49 IU/L (ref 39–117)
BUN/Creatinine Ratio: 12 (ref 9–23)
BUN: 10 mg/dL (ref 6–24)
Bilirubin Total: 0.2 mg/dL (ref 0.0–1.2)
CALCIUM: 9.5 mg/dL (ref 8.7–10.2)
CO2: 26 mmol/L (ref 20–29)
Chloride: 103 mmol/L (ref 96–106)
Creatinine, Ser: 0.82 mg/dL (ref 0.57–1.00)
GFR, EST AFRICAN AMERICAN: 101 mL/min/{1.73_m2} (ref >59)
GFR, EST NON AFRICAN AMERICAN: 88 mL/min/{1.73_m2} (ref >59)
GLUCOSE: 82 mg/dL (ref 65–99)
Globulin, Total: 2.5 g/dL (ref 1.5–4.5)
Potassium: 4.8 mmol/L (ref 3.5–5.2)
Sodium: 143 mmol/L (ref 134–144)
Total Protein: 6.6 g/dL (ref 6.0–8.5)

## 2017-12-26 LAB — LITHIUM LEVEL: LITHIUM LVL: 0.2 mmol/L — AB (ref 0.6–1.2)

## 2017-12-28 ENCOUNTER — Telehealth: Payer: Self-pay

## 2017-12-28 MED ORDER — AMPHETAMINE-DEXTROAMPHETAMINE 30 MG PO TABS: 30.0000 mg | ORAL_TABLET | Freq: Two times a day (BID) | ORAL | 0 refills | Status: DC

## 2017-12-28 NOTE — Telephone Encounter (Signed)
Thanks, they are BID.  I will rewrite

## 2017-12-28 NOTE — Progress Notes (Signed)
Here are you labs.  They look good.  Lithium levels are stable and OK as long as your symptoms are controlled

## 2017-12-28 NOTE — Telephone Encounter (Signed)
Pharmacy sent a fax regarding the 3 adderall prescriptions that were sent in. One was written for one tablet BID and the other two were written for one tablet daily. Pharmacy would like clarification on the prescriptions and to have them resent if the directions need to be changed.

## 2018-02-22 ENCOUNTER — Telehealth: Payer: Self-pay | Admitting: Unknown Physician Specialty

## 2018-02-22 NOTE — Telephone Encounter (Signed)
Courtney Davidson wrote for 56 tablets because that is a 28 supply, which is four weeks. She should be allowed to fill this on the 29th day. Courtney Davidson has future ordered an Rx and this has gone in today.

## 2018-02-22 NOTE — Telephone Encounter (Signed)
Copied from CRM [id number]. Topic: Quick Communication - Rx Refill/Question >> Feb 22, 2018 11:21 AM Maia Pettiesrtiz, Kristie S wrote: Medication: adderall - pt called upset because she had to go without medication for 2 days - RXs have been written for 56 doses and she takes 2/day so it isn't a 30 day supply - pt requesting this be fixed so her next refill can be dispensed correctly.  [facility]with phone number or street [facility] [address] - [place], [place] - [id number] [phone] (Phone) [phone] (Fax)

## 2018-03-05 ENCOUNTER — Ambulatory Visit: Payer: Managed Care, Other (non HMO) | Admitting: Family Medicine

## 2018-03-05 ENCOUNTER — Encounter: Payer: Self-pay | Admitting: Family Medicine

## 2018-03-05 VITALS — BP 102/67 | HR 96 | Temp 98.6°F | Ht 64.0 in | Wt 156.1 lb

## 2018-03-05 DIAGNOSIS — R5382 Chronic fatigue, unspecified: Secondary | ICD-10-CM | POA: Diagnosis not present

## 2018-03-05 DIAGNOSIS — F322 Major depressive disorder, single episode, severe without psychotic features: Secondary | ICD-10-CM | POA: Diagnosis not present

## 2018-03-05 DIAGNOSIS — G9332 Myalgic encephalomyelitis/chronic fatigue syndrome: Secondary | ICD-10-CM

## 2018-03-05 MED ORDER — AMPHETAMINE-DEXTROAMPHETAMINE 30 MG PO TABS: 30.0000 mg | ORAL_TABLET | Freq: Two times a day (BID) | ORAL | 0 refills | Status: DC

## 2018-03-05 MED ORDER — LURASIDONE HCL 40 MG PO TABS: 40.0000 mg | ORAL_TABLET | Freq: Every day | ORAL | 5 refills | Status: DC

## 2018-03-05 NOTE — Patient Instructions (Signed)
Take 1/2 tablet twice daily for one week Take 1/2 tablet once daily for one week

## 2018-03-05 NOTE — Progress Notes (Signed)
BP 102/67 (BP Location: Right Arm, Patient Position: Sitting, Cuff Size: Normal)   Pulse 96   Temp 98.6 F (37 C) (Oral)   Ht 5\' 4"  (1.626 m)   Wt 156 lb 1.6 oz (70.8 kg)   SpO2 97%   BMI 26.79 kg/m    Subjective:    Patient ID: Courtney Davidson, female    DOB: 1976-03-31, 42 y.o.   MRN: [id number]  HPI: Courtney Davidson is a 42 y.o. female  Chief Complaint  Patient presents with  . Depression    Patient states no complaints.  . Anxiety  . ADHD   Here today for mood, anxiety, and chronic fatigue f/u.   Currently taking prozac and lithium with minimal relief. Not very good about remembering her night dose of lithium, and has been subtherapeutic when levels have been checked the past few times. Poor sleep, fatigue, no motivation. Denies SI/HI.   Feels the adderall is not doing enough for her anymore, feels tired all the time. Has been on the same dose for years and no longer feels benefit. Denies any side effects to the medication.   Depression screen [facility] 2/9 03/05/2018 12/25/2017 12/25/2017  Decreased Interest 0 1 1  Down, Depressed, Hopeless 1 0 0  PHQ - 2 Score 1 1 1   Altered sleeping 0 1 1  Tired, decreased energy 2 1 1   Change in appetite 2 0 0  Feeling bad or failure about yourself  0 0 0  Trouble concentrating 0 0 0  Moving slowly or fidgety/restless 0 1 1  Suicidal thoughts 0 0 0  PHQ-9 Score 5 4 4   Some recent data might be hidden   Relevant past medical, surgical, family and social history reviewed and updated as indicated. Interim medical history since our last visit reviewed. Allergies and medications reviewed and updated.  Review of Systems  Per HPI unless specifically indicated above     Objective:    BP 102/67 (BP Location: Right Arm, Patient Position: Sitting, Cuff Size: Normal)   Pulse 96   Temp 98.6 F (37 C) (Oral)   Ht 5\' 4"  (1.626 m)   Wt 156 lb 1.6 oz (70.8 kg)   SpO2 97%   BMI 26.79 kg/m   Wt Readings from Last 3 Encounters:    03/05/18 156 lb 1.6 oz (70.8 kg)  12/25/17 154 lb 3.2 oz (69.9 kg)  10/23/17 152 lb 3.2 oz (69 kg)    Physical Exam  Constitutional: She is oriented to person, place, and time. She appears well-developed and well-nourished. No distress.  HENT:  Head: Atraumatic.  Eyes: Conjunctivae and EOM are normal.  Neck: Normal range of motion. Neck supple.  Cardiovascular: Normal rate and regular rhythm.  Pulmonary/Chest: Effort normal and breath sounds normal.  Musculoskeletal: Normal range of motion.  Neurological: She is alert and oriented to person, place, and time.  Skin: Skin is warm and dry.  Psychiatric: Her behavior is normal.  Flat affect  Nursing note and vitals reviewed.  Results for orders placed or performed in visit on 12/25/17  Lithium level  Result Value Ref Range   Lithium Lvl 0.2 (L) 0.6 - 1.2 mmol/L  CBC with Differential/Platelet  Result Value Ref Range   WBC 9.6 3.4 - 10.8 x10E3/uL   RBC 4.65 3.77 - 5.28 x10E6/uL   Hemoglobin 13.7 11.1 - 15.9 g/dL   Hematocrit 42.3 53.6 - 46.6 %   MCV 88 79 - 97 fL   MCH 29.5 26.6 -  33.0 pg   MCHC 33.6 31.5 - 35.7 g/dL   RDW 60.4 54.0 - 98.1 %   Platelets 354 150 - 450 x10E3/uL   Neutrophils 66 Not Estab. %   Lymphs 27 Not Estab. %   Monocytes 5 Not Estab. %   Eos 2 Not Estab. %   Basos 0 Not Estab. %   Neutrophils Absolute 6.4 1.4 - 7.0 x10E3/uL   Lymphocytes Absolute 2.6 0.7 - 3.1 x10E3/uL   Monocytes Absolute 0.4 0.1 - 0.9 x10E3/uL   EOS (ABSOLUTE) 0.2 0.0 - 0.4 x10E3/uL   Basophils Absolute 0.0 0.0 - 0.2 x10E3/uL   Immature Granulocytes 0 Not Estab. %   Immature Grans (Abs) 0.0 0.0 - 0.1 x10E3/uL  Comprehensive metabolic panel  Result Value Ref Range   Glucose 82 65 - 99 mg/dL   BUN 10 6 - 24 mg/dL   Creatinine, Ser 1.91 0.57 - 1.00 mg/dL   GFR calc non Af Amer 88 >59 mL/min/1.73   GFR calc Af Amer 101 >59 mL/min/1.73   BUN/Creatinine Ratio 12 9 - 23   Sodium 143 134 - 144 mmol/L   Potassium 4.8 3.5 - 5.2 mmol/L    Chloride 103 96 - 106 mmol/L   CO2 26 20 - 29 mmol/L   Calcium 9.5 8.7 - 10.2 mg/dL   Total Protein 6.6 6.0 - 8.5 g/dL   Albumin 4.1 3.5 - 5.5 g/dL   Globulin, Total 2.5 1.5 - 4.5 g/dL   Albumin/Globulin Ratio 1.6 1.2 - 2.2   Bilirubin Total 0.2 0.0 - 1.2 mg/dL   Alkaline Phosphatase 49 39 - 117 IU/L   AST 16 0 - 40 IU/L   ALT 13 0 - 32 IU/L      Assessment & Plan:   Problem List Items Addressed This Visit      Nervous and Auditory   CFS (chronic fatigue syndrome)    Encouraged switch to different type of stimulant medication such as concerta, pt declines at this time and would like to just keep the adderall. Refills given.         Other   Depression, major, single episode, severe (HCC) - Primary    Will switch to latuda and d/c the lithium to help with compliance. Continue the prozac.         [place] Controlled Substance Database reviewed and appropriate.   Follow up plan: Return in about 2 months (around 05/05/2018) for Mood f/u.

## 2018-03-08 NOTE — Assessment & Plan Note (Signed)
Encouraged switch to different type of stimulant medication such as concerta, pt declines at this time and would like to just keep the adderall. Refills given.

## 2018-03-08 NOTE — Assessment & Plan Note (Signed)
Will switch to latuda and d/c the lithium to help with compliance. Continue the prozac.

## 2018-03-09 ENCOUNTER — Other Ambulatory Visit: Payer: Self-pay | Admitting: Family Medicine

## 2018-04-16 ENCOUNTER — Other Ambulatory Visit: Payer: Self-pay | Admitting: Family Medicine

## 2018-04-16 NOTE — Telephone Encounter (Signed)
Since you're here 

## 2018-04-16 NOTE — Telephone Encounter (Signed)
Requested medication (s) are due for refill today: yes   Requested medication (s) are on the active medication list: yes  Last refill:  11/03/17  Future visit scheduled: no  Notes to clinic:  Per visit note of R. Lane on 03/05/18  medication stopped and pt place on latuda.  Med is still on active med list!     Requested Prescriptions  Pending Prescriptions Disp Refills   lithium 300 MG tablet [Pharmacy Med Name: LITHIUM CARB 300MG   TAB] 60 tablet 1    Sig: TAKE 1 TABLET BY MOUTH TWICE DAILY     Not Delegated - Psychiatry:  Antimanics/Bipolar Agents Failed - 04/16/2018 11:20 AM      Failed - This refill cannot be delegated      Failed - TSH in normal range and within 180 days    TSH  Date Value Ref Range Status  07/21/2016 0.776 0.450 - 4.500 uIU/mL Final         Failed - Lithium Level in normal range and within 180 days    Lithium Lvl  Date Value Ref Range Status  12/25/2017 0.2 (L) 0.6 - 1.2 mmol/L Final    Comment:                                     Detection Limit = 0.1                           <0.1 indicates None Detected          Passed - Cr in normal range and within 180 days    Creatinine, Ser  Date Value Ref Range Status  12/25/2017 0.82 0.57 - 1.00 mg/dL Final         Passed - Valid encounter within last 6 months    Recent Outpatient Visits          1 month ago Depression, major, single episode, severe (HCC)   [facility] [place], Salley Hews, PA-C   3 months ago Medication monitoring encounter   [facility] Gabriel Cirri, NP   5 months ago Depression, major, single episode, severe (HCC)   [facility] Gabriel Cirri, NP   11 months ago SOB (shortness of breath)   [facility] Gabriel Cirri, NP   1 year ago Medication monitoring encounter   [facility] Gabriel Cirri, NP      Future Appointments            In 3 weeks Gabriel Cirri, NP [facility]

## 2018-05-03 ENCOUNTER — Other Ambulatory Visit: Payer: Self-pay | Admitting: Unknown Physician Specialty

## 2018-05-03 DIAGNOSIS — R918 Other nonspecific abnormal finding of lung field: Secondary | ICD-10-CM

## 2018-05-03 NOTE — Progress Notes (Signed)
Reentered CT orders

## 2018-05-07 ENCOUNTER — Encounter: Payer: Self-pay | Admitting: Unknown Physician Specialty

## 2018-05-07 ENCOUNTER — Ambulatory Visit: Payer: Managed Care, Other (non HMO) | Admitting: Unknown Physician Specialty

## 2018-05-07 DIAGNOSIS — R911 Solitary pulmonary nodule: Secondary | ICD-10-CM | POA: Diagnosis not present

## 2018-05-07 DIAGNOSIS — F322 Major depressive disorder, single episode, severe without psychotic features: Secondary | ICD-10-CM | POA: Diagnosis not present

## 2018-05-07 MED ORDER — LITHIUM CARBONATE 300 MG PO TABS: 300.0000 mg | ORAL_TABLET | Freq: Two times a day (BID) | ORAL | 1 refills | Status: DC

## 2018-05-07 MED ORDER — AMPHETAMINE-DEXTROAMPHETAMINE 30 MG PO TABS: 30.0000 mg | ORAL_TABLET | Freq: Two times a day (BID) | ORAL | 0 refills | Status: DC

## 2018-05-07 NOTE — Patient Instructions (Signed)
If you don't hear about your lung CT in 2 weeks give Keri a call

## 2018-05-07 NOTE — Assessment & Plan Note (Signed)
Wrote one rx of Adderall.  She will be due 2 more before she returns.  She is not aware when her current rx runs out so unable to write more with do not fill dates.

## 2018-05-07 NOTE — Progress Notes (Signed)
BP 129/72 (BP Location: Left Arm, Patient Position: Sitting, Cuff Size: Normal)   Pulse 80   Temp 98 F (36.7 C)   Ht 5\' 4"  (1.626 m)   Wt 157 lb (71.2 kg)   SpO2 99%   BMI 26.95 kg/m    Subjective:    Patient ID: Courtney Davidson, female    DOB: [birth year], 42 y.o.   MRN: [id number]  HPI: Courtney Davidson is a 42 y.o. female  Chief Complaint  Patient presents with  . Depression   Depression Pt is taking Fluoxetine, Adderall, and lithium for a mood disorder.  Adderall was started years ago by a psychiatrist.  [place] last visit and did not do well (slept all day).  She is back on Lithium and doing well.   Depression screen [facility] 2/9 05/07/2018 03/05/2018 12/25/2017 12/25/2017 10/23/2017  Decreased Interest 0 0 1 1 1   Down, Depressed, Hopeless 1 1 0 0 1  PHQ - 2 Score 1 1 1 1 2   Altered sleeping 0 0 1 1 1   Tired, decreased energy 2 2 1 1 2   Change in appetite 0 2 0 0 2  Feeling bad or failure about yourself  0 0 0 0 1  Trouble concentrating 0 0 0 0 1  Moving slowly or fidgety/restless 0 0 1 1 0  Suicidal thoughts 0 0 0 0 0  PHQ-9 Score 3 5 4 4 9   Some recent data might be hidden   Abnormal CT Lost to f/u for nodule found on low dose CT.  A CT without contrast ordered  Relevant past medical, surgical, family and social history reviewed and updated as indicated. Interim medical history since our last visit reviewed. Allergies and medications reviewed and updated.  Review of Systems  Per HPI unless specifically indicated above     Objective:    BP 129/72 (BP Location: Left Arm, Patient Position: Sitting, Cuff Size: Normal)   Pulse 80   Temp 98 F (36.7 C)   Ht 5\' 4"  (1.626 m)   Wt 157 lb (71.2 kg)   SpO2 99%   BMI 26.95 kg/m   Wt Readings from Last 3 Encounters:  05/07/18 157 lb (71.2 kg)  03/05/18 156 lb 1.6 oz (70.8 kg)  12/25/17 154 lb 3.2 oz (69.9 kg)    Physical Exam  Constitutional: She is oriented to person, place, and time. She appears  well-developed and well-nourished. No distress.  HENT:  Head: Normocephalic and atraumatic.  Eyes: Conjunctivae and lids are normal. Right eye exhibits no discharge. Left eye exhibits no discharge. No scleral icterus.  Neck: Normal range of motion. Neck supple. No JVD present. Carotid bruit is not present.  Cardiovascular: Normal rate, regular rhythm and normal heart sounds.  Pulmonary/Chest: Effort normal and breath sounds normal.  Abdominal: Normal appearance. There is no splenomegaly or hepatomegaly.  Musculoskeletal: Normal range of motion.  Neurological: She is alert and oriented to person, place, and time.  Skin: Skin is warm, dry and intact. No rash noted. No pallor.  Psychiatric: She has a normal mood and affect. Her behavior is normal. Judgment and thought content normal.    Results for orders placed or performed in visit on 12/25/17  Lithium level  Result Value Ref Range   Lithium Lvl 0.2 (L) 0.6 - 1.2 mmol/L  CBC with Differential/Platelet  Result Value Ref Range   WBC 9.6 3.4 - 10.8 x10E3/uL   RBC 4.65 3.77 - 5.28 x10E6/uL   Hemoglobin 13.7  11.1 - 15.9 g/dL   Hematocrit 16.1 09.6 - 46.6 %   MCV 88 79 - 97 fL   MCH 29.5 26.6 - 33.0 pg   MCHC 33.6 31.5 - 35.7 g/dL   RDW 04.5 40.9 - 81.1 %   Platelets 354 150 - 450 x10E3/uL   Neutrophils 66 Not Estab. %   Lymphs 27 Not Estab. %   Monocytes 5 Not Estab. %   Eos 2 Not Estab. %   Basos 0 Not Estab. %   Neutrophils Absolute 6.4 1.4 - 7.0 x10E3/uL   Lymphocytes Absolute 2.6 0.7 - 3.1 x10E3/uL   Monocytes Absolute 0.4 0.1 - 0.9 x10E3/uL   EOS (ABSOLUTE) 0.2 0.0 - 0.4 x10E3/uL   Basophils Absolute 0.0 0.0 - 0.2 x10E3/uL   Immature Granulocytes 0 Not Estab. %   Immature Grans (Abs) 0.0 0.0 - 0.1 x10E3/uL  Comprehensive metabolic panel  Result Value Ref Range   Glucose 82 65 - 99 mg/dL   BUN 10 6 - 24 mg/dL   Creatinine, Ser 9.14 0.57 - 1.00 mg/dL   GFR calc non Af Amer 88 >59 mL/min/1.73   GFR calc Af Amer 101 >59  mL/min/1.73   BUN/Creatinine Ratio 12 9 - 23   Sodium 143 134 - 144 mmol/L   Potassium 4.8 3.5 - 5.2 mmol/L   Chloride 103 96 - 106 mmol/L   CO2 26 20 - 29 mmol/L   Calcium 9.5 8.7 - 10.2 mg/dL   Total Protein 6.6 6.0 - 8.5 g/dL   Albumin 4.1 3.5 - 5.5 g/dL   Globulin, Total 2.5 1.5 - 4.5 g/dL   Albumin/Globulin Ratio 1.6 1.2 - 2.2   Bilirubin Total 0.2 0.0 - 1.2 mg/dL   Alkaline Phosphatase 49 39 - 117 IU/L   AST 16 0 - 40 IU/L   ALT 13 0 - 32 IU/L      Assessment & Plan:   Problem List Items Addressed This Visit      Unprioritized   Depression, major, single episode, severe (HCC)    Wrote one rx of Adderall.  She will be due 2 more before she returns.  She is not aware when her current rx runs out so unable to write more with do not fill dates.        Pulmonary nodule    Pt will call if she doesn't hear about the CT in 2 weeks          Follow up plan: Return in about 3 months (around 08/07/2018).

## 2018-05-07 NOTE — Assessment & Plan Note (Signed)
Pt will call if she doesn't hear about the CT in 2 weeks

## 2018-05-14 ENCOUNTER — Other Ambulatory Visit: Payer: Self-pay | Admitting: Unknown Physician Specialty

## 2018-05-14 DIAGNOSIS — R911 Solitary pulmonary nodule: Secondary | ICD-10-CM

## 2018-05-19 ENCOUNTER — Other Ambulatory Visit: Payer: Self-pay | Admitting: Family Medicine

## 2018-05-19 DIAGNOSIS — R918 Other nonspecific abnormal finding of lung field: Secondary | ICD-10-CM

## 2018-05-19 NOTE — Progress Notes (Signed)
CT chest reorder

## 2018-05-25 ENCOUNTER — Ambulatory Visit: Payer: Managed Care, Other (non HMO)

## 2018-05-26 NOTE — Progress Notes (Signed)
This was scheduled for the 26th but patient canceled and stated she would call back to reschedule and wanted to wait for now.

## 2018-06-02 ENCOUNTER — Ambulatory Visit
Admission: RE | Admit: 2018-06-02 | Discharge: 2018-06-02 | Disposition: A | Discharge status: Home / Self Care | Payer: Managed Care, Other (non HMO) | Source: Ambulatory Visit | Attending: Unknown Physician Specialty | Admitting: Unknown Physician Specialty

## 2018-06-02 ENCOUNTER — Telehealth: Payer: Self-pay | Admitting: Nurse Practitioner

## 2018-06-02 DIAGNOSIS — R911 Solitary pulmonary nodule: Secondary | ICD-10-CM

## 2018-06-02 NOTE — Telephone Encounter (Signed)
[facility] with CT of chest report :  MPRESSION: There is interval development of multiple nodules throughout both lungs, the largest being 7 mm spiculated nodule in right upper lobe. This is highly concerning for metastatic disease.  Unable to notify flow [facility] results.  On call provider, Dr. Judithann GravesBerglund notified and request that the provider contact the patient tomorrow.

## 2018-06-03 ENCOUNTER — Telehealth: Payer: Self-pay | Admitting: Nurse Practitioner

## 2018-06-03 DIAGNOSIS — R911 Solitary pulmonary nodule: Secondary | ICD-10-CM

## 2018-06-03 DIAGNOSIS — R739 Hyperglycemia, unspecified: Secondary | ICD-10-CM

## 2018-06-03 NOTE — Telephone Encounter (Signed)
Attempt to call patient at [phone] (mobile).  No answer and per message "you have reached voicemail box that has not been set up".  Unable to leave message to call provider.  Wish to discuss recent CT scan results showing "There is interval development of multiple nodules throughout both lungs, the largest being 7 mm spiculated nodule in right upper lobe. This is highly concerning for metastatic disease." .  Need to place referral to cancer center, which will do upon discussion with patient.  Will continue to attempt calls.

## 2018-06-03 NOTE — Telephone Encounter (Signed)
Spoke to Courtney Davidson via telephone to review recent CT scan results "There is interval development of multiple nodules throughout both lungs, the largest being 7 mm spiculated nodule in right upper lobe. This is highly concerning for metastatic disease".  Discussed need for referral to [facility] at [facility] for further work-up.  She reports understanding and agreement with plan of care.  Offered supportive listening and answered all questions.  Discussed with patient that provider is present if any questions arise during this time.  Referral to Cancer Center placed.

## 2018-06-04 ENCOUNTER — Encounter: Payer: Self-pay | Admitting: *Deleted

## 2018-06-04 ENCOUNTER — Encounter: Payer: Self-pay | Admitting: Urgent Care

## 2018-06-04 DIAGNOSIS — R918 Other nonspecific abnormal finding of lung field: Secondary | ICD-10-CM

## 2018-06-04 NOTE — Progress Notes (Signed)
  Oncology Nurse Navigator Documentation  Navigator Location: [facility] (06/04/18 1000) Referral date to RadOnc/MedOnc: 06/03/18 (06/04/18 1000) )Navigator Encounter Type: Introductory phone call (06/04/18 1000)   Abnormal Finding Date: 06/02/18 (06/04/18 1000)                   Treatment Phase: Abnormal Scans (06/04/18 1000) Barriers/Navigation Needs: Coordination of Care (06/04/18 1000)   Interventions: Coordination of Care (06/04/18 1000)   Coordination of Care: Appts;Radiology (06/04/18 1000)        Acuity: Level 2 (06/04/18 1000)   Acuity Level 2: Initial guidance, education and coordination as needed;Educational needs;Assistance expediting appointments (06/04/18 1000)  phone call made to patient to introduce to navigator services. Upcoming appts reviewed with patient. Prep instructions given for PET scan. Contact info given and instructed to call with any questions or needs. Pt verbalized understanding and confirmed appts.    Time Spent with Patient: 30 (06/04/18 1000)

## 2018-06-05 NOTE — Progress Notes (Addendum)
[facility] 06/05/18   Re: Treatment pre-planning   Courtney Davidson is a 42 y.o. year old female patient referred to the cancer center for further evaluation of multiple pulmonary nodules. Patient being referred to us by her PCP Marjie Skiffannady, [name], NP. Referral notes and pertinent imaging personally reviewed.    Patient had CT imaging of the chest with contrast to follow up on a previously identified nodules identified back in 2018. Review of PCP clinic notes indicate that patient was lost to follow up.  Repeat CT imaging of the chest done on 06/02/2018 revealed interval development of multiple lung nodules throughout both lungs. Of most concern is a  7 mm spiculated nodule in the RUL. Based on morphology, the RUL nodule is suspicious for primary vs. metastatic bronchogenic neoplasm.     As part of her standard pre-planning work-up, this patient will require advanced imaging (PET scan) in order to determine the most suitable site for tissue biopsy required for diagnosis and staging. Additionally, PET imaging will allow for us to assess for distant lesions elsewhere that would confirm disease metastasis, thus assisting our oncology team in being able to develop an effective treatment regimen for this patient. Advanced planning is required in order to ensure that we are able to obtain a tissue sample via the safest means possible. PET imaging will allow for detection of areas with increased metabolic activity, which in turn will allow for a more targeted approach by our interventional radiology team. Sampling of a low level lesion identified on standard CT may result in care delays for this patient should re-sampling be required.   Courtney Davidson is scheduled to be reviewed by our multi-disciplinary tumor board for pre-treatment strategies and recommendations. She is scheduled to be seen in clinic on 06/11/2018, at which time she will meet with her primary oncology  Merlene Pulling(Corcoran, MD), myself, and pulmonary medicine provider. Being able to have her PET scan completed prior to her clinic appointment will expedite the course of her treatment, and provide us with the data that we need to ensure that a comprehensive approach is taken.      Quentin MullingBryan Camauri Fleece, MSN, APRN, FNP-C, CEN Oncology/Hematology Nurse Practitioner  [facility] 06/05/18, 8:58 PM

## 2018-06-09 ENCOUNTER — Telehealth: Payer: Self-pay | Admitting: *Deleted

## 2018-06-09 ENCOUNTER — Telehealth: Payer: Self-pay | Admitting: Nurse Practitioner

## 2018-06-09 NOTE — Telephone Encounter (Addendum)
Per Merry ProudBrandi 06/09/18 staff message:still waiting on an approval for the PET scheduled for 06/10/18 Please cancel and we will rs once we receive the approval.    I Called patient x2 to make her aware that the 12/12 scheduled PET scan had been CANCELLED. I was unable to leave a message on her vmail. I also tired calling the pts. daughter Courtney Shiley(Tiffany) a detailed message was left on her vmail to make her aware and to possibly notify her mother in regards to the scheduled  06/10/18 PET scan had been cancelled and will be R/S once it's approved by Ins.

## 2018-06-09 NOTE — Telephone Encounter (Signed)
Spoke with patient. She was going to speak with someone at the cancer center (someone was calling her when I was on the phone with her).

## 2018-06-09 NOTE — Telephone Encounter (Signed)
Tried to reach patient but she does not have her VM set up.

## 2018-06-09 NOTE — Telephone Encounter (Signed)
Copied from CRM [id number]. Topic: General - Other >> Jun 09, 2018  3:33 PM Terisa Starraylor, Brittany L wrote: Reason for CRM: Patient states that her insurance company has tried to reach the office several times regarding her pet scan. The pet scan was for tomorrow at 8am but has been canceled. She is very upset and would like someone to give her a call back today. She said that her insurance is [employer] and the number [id number], [employer] (insurance). >> Jun 09, 2018  3:45 PM Gabriel CirriCurtis, Karen E wrote: See note please advise thank you

## 2018-06-10 ENCOUNTER — Ambulatory Visit
Admission: RE | Admit: 2018-06-10 | Discharge: 2018-06-10 | Disposition: A | Discharge status: Home / Self Care | Payer: Managed Care, Other (non HMO) | Source: Ambulatory Visit | Attending: Hematology and Oncology | Admitting: Hematology and Oncology

## 2018-06-10 DIAGNOSIS — R918 Other nonspecific abnormal finding of lung field: Secondary | ICD-10-CM | POA: Insufficient documentation

## 2018-06-10 DIAGNOSIS — K8020 Calculus of gallbladder without cholecystitis without obstruction: Secondary | ICD-10-CM | POA: Diagnosis not present

## 2018-06-10 DIAGNOSIS — I700 Atherosclerosis of aorta: Secondary | ICD-10-CM | POA: Diagnosis not present

## 2018-06-10 LAB — GLUCOSE, CAPILLARY: Glucose-Capillary: 97 mg/dL (ref 70–99)

## 2018-06-10 MED ORDER — FLUDEOXYGLUCOSE F - 18 (FDG) INJECTION
8.1000 | Freq: Once | INTRAVENOUS | Status: AC | PRN
  Administered 2018-06-10: 8.68 via INTRAVENOUS

## 2018-06-10 NOTE — Progress Notes (Signed)
[facility]-  Cancer Center  Clinic day:  06/11/2018  Chief Complaint: Courtney Davidson is a 42 y.o. female with multiple pulmonary nodules who is referred in consultation by Courtney Davidson for assessment and management.  HPI:   The patient notes being hospitalized with a UTI 3 years ago.  She was seen for right flank and low back pain, fever, chills, and urinary frequency.  CT renal study on 01/15/2016 revealed mild asymmetric right perinephric stranding, suspicious for pyelonephritis. There was no evidence of urolithiasis or hydronephrosis.  There was a 3.8 cm benign-appearing left ovarian cyst, most likely physiologic.  There was cholelithiasis.  There was a 5 mm indeterminate left lower lobe pulmonary nodule  CXR on 12/25/2017 revealed no active cardiopulmonary disease.  She notes that every year, she has had CT scans.  Chest CT on 02/04/2017 revealed a stable LLL pulmonary nodule.  There were multiple 2-4 mm pulmonary nodules scattered throughout both lungs.  There was diffuse bronchial wall thickening with mild to moderate centrilobular and mild paraseptal emphysema; imaging findings suggestive of underlying COPD.  Chest CT on 06/02/2018 revealed interval development of multiple nodules throughout both lungs, the largest being 7 mm spiculated nodule in right upper lobe. This was highly concerning for metastatic disease.   PET scan on 06/10/2018 revealed redemonstration of multiple small pulmonary nodules, many of which are hypermetabolic. There was no evidence of hypermetabolic primary malignancy. Given lack of primary malignancy, nodules were likely infectious/inflammatory. Metastatic disease from otherwise occult primary cannot be excluded. Potential clinical strategies include tissue sampling versus antibiotic therapy and CT follow-up at 2-3 months. Dependent right lower lobe hypermetabolism likely represents atelectasis.  Symptomatically, she feels "normal". Patient denies  fevers and sweats. She notes that a couple of months ago, patient lost 10-12 pounds over the course of 3 months. She felt that this was due to work.  Her PCP was concerned. Of note, patient has regained the weight at this point. Patient advises that she maintains an adequate appetite. She is eating "her normal", which is one meal a day. Weight today is 154 lb 3 oz (69.9 kg).  She complains of frequent headaches over the course of the last year. No associated visual changes. Patient has exertional shortness of breath and a chronic cough. Patient has previously smoked 1.5 packs of cigarettes a day for 34 years. She states, "this lung thing scared the crap out of me. For the last week, I have cut back to 0.5 packs a day". Patient with urinary frequency and urgency for the last 3 months. She denies abdominal pain.   She complains of arthritic pain that has been insidious in nature. She describes at times feeling light headed and weak.  She denies any skin changes.   Regarding preventative care, patient does not have routine pelvic examinations or mammograms. She has never had a colonoscopy.   Her mother had lung cancer, and her father had prostate and pancreatic cancer. Patient denies any family history that is significant for any type of hematological disorders.    Past Medical History:  Diagnosis Date  . Anxiety   . Carpal tunnel syndrome   . CFS (chronic fatigue syndrome)   . Depression   . Fatigue   . Hypoglycemia     Past Surgical History:  Procedure Laterality Date  . CARPAL TUNNEL RELEASE Bilateral   . CESAREAN SECTION      Family History  Problem Relation Age of Onset  . Cancer Mother  lung  . Diabetes Mother   . Stroke Mother   . Hypertension Mother   . Cancer Father     Social History:  reports that she has been smoking cigarettes. She has been smoking about 1.00 pack per day. She has never used smokeless tobacco. She reports that she does not drink alcohol or use  drugs.  She has smoked for 34 years.  Employed full time as a Lexicographer"parts assembler". Patient denies known exposures to radiation on toxins. The patient is accompanied her sister, Courtney Davidson, and daughter, Courtney Davidson, today.  Allergies: No Known Allergies  Current Medications: Current Outpatient Medications  Medication Sig Dispense Refill  . amphetamine-dextroamphetamine (ADDERALL) 30 MG tablet Take 1 tablet by mouth 2 (two) times daily. 60 tablet 0  . FLUoxetine (PROZAC) 40 MG capsule TAKE 1 CAPSULE BY MOUTH ONCE DAILY 90 capsule 1  . lithium 300 MG tablet Take 1 tablet (300 mg total) by mouth 2 (two) times daily. 60 tablet 1   No current facility-administered medications for this visit.     Review of Systems:  GENERAL:  Feels "normal".  No fevers or sweats.  Weight loss 2 months ago (gained back). PERFORMANCE STATUS (ECOG):  1 HEENT:  Sore throat this morning.  No visual changes, runny nose,mouth sores or tenderness. Lungs: Shortness of breath with activity.  Chronic cough.  No hemoptysis. Cardiac:  No chest pain, palpitations, orthopnea, or PND. GI:  Occasional nausea.  Eats 1 meal/day.  No vomiting, diarrhea, constipation, melena or hematochezia. GU:  Urgency and frequency x 3-4 months.  No dysuria, or hematuria. Musculoskeletal:  Achiness in bones and joints.  No back pain. No muscle tenderness. Extremities:  No pain or swelling. Skin:  No rashes or skin changes. Neuro:  Headache at times.  No numbness or weakness, balance or coordination issues. Endocrine:  No diabetes, thyroid issues, hot flashes or night sweats. Psych:  No mood changes, depression or anxiety. Pain:  No focal pain. Review of systems:  All other systems reviewed and found to be negative.  Physical Exam: Blood pressure 113/79, pulse 89, temperature 100 F (37.8 C), temperature source Tympanic, resp. rate 18, height 5\' 4"  (1.626 m), weight 154 lb 3 oz (69.9 kg), last menstrual period 05/26/2018, SpO2 97 %. GENERAL:  Well  developed, well nourished, woman sitting comfortably in the exam room in no acute distress. MENTAL STATUS:  Alert and oriented to person, place and time. HEAD:  Long dark hair.  Normocephalic, atraumatic, face symmetric, no Cushingoid features. EYES:  Pupils equal round and reactive to light and accomodation.  No conjunctivitis or scleral icterus. ENT:  Oropharynx clear without lesion.  Tongue normal.  Dentures.  Mucous membranes moist.  RESPIRATORY:  Clear to auscultation without rales, wheezes or rhonchi. CARDIOVASCULAR:  Regular rate and rhythm without murmur, rub or gallop. ABDOMEN:  Soft, non-tender, with active bowel sounds, and no hepatosplenomegaly.  No masses. SKIN:  No rashes, ulcers or lesions. EXTREMITIES: No edema, no skin discoloration or tenderness.  No palpable cords. LYMPH NODES: No palpable cervical, supraclavicular, axillary or inguinal adenopathy  NEUROLOGICAL: Unremarkable. PSYCH:  Appropriate.   Hospital Outpatient Visit on 06/10/2018  Component Date Value Ref Range Status  . Glucose-Capillary 06/10/2018 97  70 - 99 mg/dL Final    Assessment:  Ardis RowanLoretta L Morris is a 42 y.o. female with multiple pulmonary nodules.  She has a 30-25 pack year smoking history.    PET scan on 06/10/2018 revealed multiple small pulmonary nodules, many of which are  hypermetabolic. There was no evidence of hypermetabolic primary malignancy. Given lack of a primary malignancy, nodules are likely infectious/inflammatory. Metastatic disease from otherwise occult primary cannot be excluded. Potential clinical strategies include tissue sampling versus antibiotic therapy and CT follow-up at 2-3 months. Dependent right lower lobe hypermetabolism likely represents atelectasis.  Symptomatically, she feels "normal".  She has a chronic cough.  Exam is unremarkable.  Plan: 1.  Labs today:  CBC with diff, CMP, CEA, CA27.29.  2.  Multiple pulmonary nodules  Etiology unclear.  PET scan personally  reviewed.  Agree with radiology interpretation.  Patient has a smoking history, but no evidence of a primary.  Etiology may be infectious.  Nodules have been present for at least 2 years.  Present at tumor board.  Discuss referral to pulmonary medicine. 3.  General health maintenance  Bilateral mammogram.  Encourage smoking cessation. 4.  RTC after pulmonary consult.  Patient to call.   Quentin Mulling, NP  06/11/2018, 9:56 AM   I saw and evaluated the patient, participating in the key portions of the service and reviewing pertinent diagnostic studies and records.  I reviewed the nurse practitioner's note and agree with the findings and the plan.  The assessment and plan were discussed with the patient.  Several questions were asked by the patient and answered.   Nelva Nay, MD 06/11/2018,9:56 AM

## 2018-06-11 ENCOUNTER — Inpatient Hospital Stay: Payer: Managed Care, Other (non HMO)

## 2018-06-11 ENCOUNTER — Encounter: Payer: Self-pay | Admitting: *Deleted

## 2018-06-11 ENCOUNTER — Encounter: Payer: Self-pay | Admitting: Hematology and Oncology

## 2018-06-11 ENCOUNTER — Inpatient Hospital Stay: Payer: Managed Care, Other (non HMO) | Attending: Hematology and Oncology | Admitting: Hematology and Oncology

## 2018-06-11 VITALS — BP 113/79 | HR 89 | Temp 100.0°F | Resp 18 | Ht 64.0 in | Wt 154.2 lb

## 2018-06-11 DIAGNOSIS — R918 Other nonspecific abnormal finding of lung field: Secondary | ICD-10-CM

## 2018-06-11 DIAGNOSIS — Z8042 Family history of malignant neoplasm of prostate: Secondary | ICD-10-CM

## 2018-06-11 DIAGNOSIS — F17210 Nicotine dependence, cigarettes, uncomplicated: Secondary | ICD-10-CM | POA: Diagnosis not present

## 2018-06-11 DIAGNOSIS — Z801 Family history of malignant neoplasm of trachea, bronchus and lung: Secondary | ICD-10-CM

## 2018-06-11 DIAGNOSIS — Z800 Family history of malignant neoplasm of digestive organs: Secondary | ICD-10-CM | POA: Diagnosis not present

## 2018-06-11 DIAGNOSIS — R05 Cough: Secondary | ICD-10-CM | POA: Diagnosis not present

## 2018-06-11 DIAGNOSIS — R0602 Shortness of breath: Secondary | ICD-10-CM

## 2018-06-11 DIAGNOSIS — Z1239 Encounter for other screening for malignant neoplasm of breast: Secondary | ICD-10-CM

## 2018-06-11 DIAGNOSIS — J029 Acute pharyngitis, unspecified: Secondary | ICD-10-CM | POA: Diagnosis not present

## 2018-06-11 DIAGNOSIS — R3915 Urgency of urination: Secondary | ICD-10-CM | POA: Insufficient documentation

## 2018-06-11 LAB — COMPREHENSIVE METABOLIC PANEL
ALT: 11 U/L (ref 0–44)
AST: 15 U/L (ref 15–41)
Albumin: 3.8 g/dL (ref 3.5–5.0)
Alkaline Phosphatase: 49 U/L (ref 38–126)
Anion gap: 7 (ref 5–15)
BUN: 11 mg/dL (ref 6–20)
CO2: 26 mmol/L (ref 22–32)
Calcium: 9 mg/dL (ref 8.9–10.3)
Chloride: 105 mmol/L (ref 98–111)
Creatinine, Ser: 0.73 mg/dL (ref 0.44–1.00)
GFR calc Af Amer: >60 mL/min (ref >60)
GFR calc non Af Amer: >60 mL/min (ref >60)
Glucose, Bld: 100 mg/dL — ABNORMAL HIGH (ref 70–99)
Potassium: 4.4 mmol/L (ref 3.5–5.1)
Sodium: 138 mmol/L (ref 135–145)
Total Bilirubin: 0.4 mg/dL (ref 0.3–1.2)
Total Protein: 7.4 g/dL (ref 6.5–8.1)

## 2018-06-11 LAB — CBC WITH DIFFERENTIAL/PLATELET
Abs Immature Granulocytes: 0.03 10*3/uL (ref 0.00–0.07)
Basophils Absolute: 0 10*3/uL (ref 0.0–0.1)
Basophils Relative: 0 %
Eosinophils Absolute: 0.2 10*3/uL (ref 0.0–0.5)
Eosinophils Relative: 2 %
HCT: 40.8 % (ref 36.0–46.0)
HEMOGLOBIN: 13.6 g/dL (ref 12.0–15.0)
Immature Granulocytes: 0 %
LYMPHS PCT: 17 %
Lymphs Abs: 1.7 10*3/uL (ref 0.7–4.0)
MCH: 29.2 pg (ref 26.0–34.0)
MCHC: 33.3 g/dL (ref 30.0–36.0)
MCV: 87.7 fL (ref 80.0–100.0)
Monocytes Absolute: 0.5 10*3/uL (ref 0.1–1.0)
Monocytes Relative: 5 %
Neutro Abs: 7.8 10*3/uL — ABNORMAL HIGH (ref 1.7–7.7)
Neutrophils Relative %: 76 %
Platelets: 363 10*3/uL (ref 150–400)
RBC: 4.65 MIL/uL (ref 3.87–5.11)
RDW: 11.9 % (ref 11.5–15.5)
WBC: 10.2 10*3/uL (ref 4.0–10.5)
nRBC: 0 % (ref 0.0–0.2)

## 2018-06-11 NOTE — Progress Notes (Signed)
  Oncology Nurse Navigator Documentation  Navigator Location: [facility]Med Onc (06/11/18 1500)   )Navigator Encounter Type: Clinic/MDC (06/11/18 1500)               Multidisiplinary Clinic Date: 06/11/18 (06/11/18 1500) Multidisiplinary Clinic Type: Thoracic (06/11/18 1500)   Patient Visit Type: MedOnc (06/11/18 1500)   Barriers/Navigation Needs: Coordination of Care (06/11/18 1500)   Interventions: Coordination of Care (06/11/18 1500)   Coordination of Care: Appts (06/11/18 1500)       Met with patient during initial consult with med-onc to discuss results from recent PET scan. All questions answered during visit. Reviewed upcoming appts with pt. Contact info given and instructed to call with any further questions or needs. Pt verbalized understanding.           Time Spent with Patient: 30 (06/11/18 1500)

## 2018-06-11 NOTE — Addendum Note (Signed)
Addended by: Glory BuffHODE, Robb Sibal on: 06/11/2018 03:09 PM   Modules accepted: Orders

## 2018-06-11 NOTE — Progress Notes (Signed)
Patient here today as new evaluation for Lung clinic.  Referred by J. [name] for multiple lung nodules.  Patient accompanied by her daughter and sister today.

## 2018-06-12 LAB — CEA: CEA: 2.5 ng/mL (ref 0.0–4.7)

## 2018-06-12 LAB — CANCER ANTIGEN 27.29: CA 27.29: 15.8 U/mL (ref 0.0–38.6)

## 2018-06-16 ENCOUNTER — Ambulatory Visit
Admission: RE | Admit: 2018-06-16 | Discharge: 2018-06-16 | Disposition: A | Discharge status: Home / Self Care | Payer: Managed Care, Other (non HMO) | Source: Ambulatory Visit | Attending: Urgent Care | Admitting: Urgent Care

## 2018-06-16 DIAGNOSIS — R918 Other nonspecific abnormal finding of lung field: Secondary | ICD-10-CM | POA: Diagnosis not present

## 2018-06-16 DIAGNOSIS — Z1239 Encounter for other screening for malignant neoplasm of breast: Secondary | ICD-10-CM | POA: Diagnosis present

## 2018-06-17 ENCOUNTER — Other Ambulatory Visit: Payer: Managed Care, Other (non HMO)

## 2018-06-17 NOTE — Progress Notes (Signed)
Tumor Board Documentation  Ardis RowanLoretta L Fadness was presented by Dr Merlene Pullingorcoran at our Tumor Board on 06/17/2018, which included representatives from medical oncology, radiation oncology, research, nutrition, radiology, surgical, genetics, internal medicine, navigation, pathology, pulmonology.  Margaretha GlassingLoretta currently presents for discussion, for MDC, as a new patient with history of the following treatments: active survellience.  Additionally, we reviewed previous medical and familial history, history of present illness, and recent lab results along with all available histopathologic and imaging studies. The tumor board considered available treatment options and made the following recommendations: Biopsy Referral to Pulmonology  The following procedures/referrals were also placed: No orders of the defined types were placed in this encounter.   Clinical Trial Status: not discussed   Tumor board is a meeting of clinicians from various specialty areas who evaluate and discuss patients for whom a multidisciplinary approach is being considered. Final determinations in the plan of care are those of the provider(s). The responsibility for follow up of recommendations given during tumor board is that of the provider.   Today's extended care, comprehensive team conference, Margaretha GlassingLoretta was not present for the discussion and was not examined.   Multidisciplinary Tumor Board is a multidisciplinary case peer review process.  Decisions discussed in the Multidisciplinary Tumor Board reflect the opinions of the specialists present at the conference without having examined the patient.  Ultimately, treatment and diagnostic decisions rest with the primary provider(s) and the patient.

## 2018-06-21 ENCOUNTER — Other Ambulatory Visit: Payer: Self-pay | Admitting: Nurse Practitioner

## 2018-06-21 MED ORDER — AMPHETAMINE-DEXTROAMPHETAMINE 30 MG PO TABS: 30.0000 mg | ORAL_TABLET | Freq: Two times a day (BID) | ORAL | 0 refills | Status: DC

## 2018-06-21 NOTE — Telephone Encounter (Signed)
Refill approved.  Pt seen last in November.  On medication for depression on review of note by provider in November.  Has f/u in February. Controlled substance.  Checked data base and no other refills of controlled substances noted.

## 2018-06-21 NOTE — Telephone Encounter (Signed)
Copied from CRM [id number]. Topic: Quick Communication - Rx Refill/Question >> Jun 21, 2018  1:12 PM Raoul PitchWilliams-Neal, Sade R wrote: Medication: amphetamine-dextroamphetamine (ADDERALL) 30 MG tablet  Has the patient contacted their pharmacy? Yes  Preferred Pharmacy (with phone number or street name): [facility] [address] - [place], [place] - [id number] [phone] (Phone) [phone] (Fax)    Agent: Please be advised that RX refills may take up to 3 business days. We ask that you follow-up with your pharmacy.

## 2018-07-08 ENCOUNTER — Ambulatory Visit (INDEPENDENT_AMBULATORY_CARE_PROVIDER_SITE_OTHER): Payer: No Typology Code available for payment source | Admitting: Pulmonary Disease

## 2018-07-08 ENCOUNTER — Encounter: Payer: Self-pay | Admitting: Pulmonary Disease

## 2018-07-08 VITALS — BP 90/58 | HR 88 | Ht 64.0 in | Wt 153.8 lb

## 2018-07-08 DIAGNOSIS — R918 Other nonspecific abnormal finding of lung field: Secondary | ICD-10-CM | POA: Diagnosis not present

## 2018-07-08 DIAGNOSIS — F17200 Nicotine dependence, unspecified, uncomplicated: Secondary | ICD-10-CM

## 2018-07-08 DIAGNOSIS — J432 Centrilobular emphysema: Secondary | ICD-10-CM | POA: Diagnosis not present

## 2018-07-08 NOTE — Patient Instructions (Signed)
Blood tests ordered today: HIV, quantiferon gold, fungitell, histoplasmosis antibody PFTs (lung function test) ordered Follow up in 6 weeks after PFTs performed

## 2018-07-08 NOTE — Progress Notes (Signed)
PULMONARY CONSULT NOTE  Requesting MD/Service: Merlene Pulling, MD Date of initial consultation: 07/08/18 Reason for consultation: multiple pulmonary nodules, smoker  PT PROFILE: 43 y.o. female smoker (trying to quit. At time of initial eval, approx 5 cigs per day. Previously 1.5 PPD. Started @ age 52) found incidentally to have a LLL nodule on CTAP performed to R/O kidney stone. Subsequent CT chests (2018 and 2019) revealed increasing number of pulmonary nodules with concern for metastatic cancer raised. PET revealed mild hypermetabolism of some of the nodules. Referred to Oncology, then to Pulmonary Medicine for further evaluation  DATA: 01/15/16 CTAP (renal stoen study): Mild asymmetric right perinephric stranding, suspicious for pyelonephritis. No evidence of urolithiasis or hydronephrosis.5 mm indeterminate left lower lobe pulmonary nodule. No follow-up needed if patient is low-risk. Non-contrast chest CT can be considered in 12 months if patient is high-risk 02/03/17 CT chest: Previously noted left lower lobe nodule is stable to slightly decreased in size, currently measuring 4 mm, considered benign. Several other scattered 2-4 mm pulmonary nodules are noted throughout the lungs bilaterally, the largest of which is in the right middle lobe associated with the minor fissure measuring 4 mm. These are nonspecific, but are statistically likely benign. Non-contrast chest CT can be considered in 12 months in this high risk patient 06/02/18 CT chest: Mild emphysema, Interval development of multiple nodules throughout both lungs, the largest being 7 mm spiculated nodule in right upper lobe. This is highly concerning for metastatic disease 06/10/18 PET: Redemonstration of multiple small pulmonary nodules, many of which are hypermetabolic. No evidence of hypermetabolic primary malignancy. Given lack of primary malignancy, nodules are likely infectious/inflammatory. Metastatic disease from otherwise occult primary  cannot be excluded  INTERVAL:  HPI:  As above. All of the history is essentially about the series of scans documented above. She denies DOE, CP, fever, hemoptysis. unexplained weight loss, PND, orthopnea, LE edema. She started smoking at a very young age and has smoked up to 1.5 PPD as an adult. She has no significant occupational exposures. She has no unusual hobbies or exotic pets. 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 She also has significant contact with 2 half siblings whose father died of TB. She has never been skin-tested for TB.   Past Medical History:  Diagnosis Date  . Anxiety   . Carpal tunnel syndrome   . CFS (chronic fatigue syndrome)   . Depression   . Fatigue   . Hypoglycemia     Past Surgical History:  Procedure Laterality Date  . CARPAL TUNNEL RELEASE Bilateral   . CESAREAN SECTION      MEDICATIONS: I have reviewed all medications and confirmed regimen as documented  Social History   Socioeconomic History  . Marital status: Divorced    Spouse name: Not on file  . Number of children: Not on file  . Years of education: Not on file  . Highest education level: Not on file  Occupational History  . Not on file  Social Needs  . Financial resource strain: Not on file  . Food insecurity:    Worry: Not on file    Inability: Not on file  . Transportation needs:    Medical: Not on file    Non-medical: Not on file  Tobacco Use  . Smoking status: Current Every Day Smoker    Packs/day: 1.50    Years: 30.00    Pack  years: 45.00    Types: Cigarettes  . Smokeless tobacco: Never Used  . Tobacco comment: about 5 cigarettes a day  Substance and Sexual Activity  . Alcohol use: No  . Drug use: No  . Sexual activity: Yes  Lifestyle  . Physical activity:    Days per week: Not on file    Minutes per session: Not on file  . Stress: Not on file  Relationships  . Social  connections:    Talks on phone: Not on file    Gets together: Not on file    Attends religious service: Not on file    Active member of club or organization: Not on file    Attends meetings of clubs or organizations: Not on file    Relationship status: Not on file  . Intimate partner violence:    Fear of current or ex partner: Not on file    Emotionally abused: Not on file    Physically abused: Not on file    Forced sexual activity: Not on file  Other Topics Concern  . Not on file  Social History Narrative  . Not on file    Family History  Problem Relation Age of Onset  . Cancer Mother        lung  . Diabetes Mother   . Stroke Mother   . Hypertension Mother   . Cancer Father     ROS: No fever, myalgias/arthralgias, unexplained weight loss or weight gain No new focal weakness or sensory deficits No otalgia, hearing loss, visual changes, nasal and sinus symptoms, mouth and throat problems No neck pain or adenopathy No abdominal pain, N/V/D, diarrhea, change in bowel pattern No dysuria, change in urinary pattern   Vitals:   07/08/18 1535  BP: (!) 90/58  Pulse: 88  SpO2: 99%  Weight: 153 lb 12.8 oz (69.8 kg)  Height: 5\' 4"  (1.626 m)     EXAM:  Gen: Appear older than true age, No overt respiratory distress HEENT: NCAT, sclera white, oropharynx normal Neck: Supple without LAN, thyromegaly, JVD Lungs: breath sounds full and slightly coarse, normal percussion, no adventitious sounds Cardiovascular: RRR, no murmurs noted Abdomen: Soft, nontender, normal BS Ext: without clubbing, cyanosis, edema Neuro: CNs grossly intact, motor and sensory intact Skin: Limited exam, no lesions noted  DATA:   BMP Latest Ref Rng & Units 06/11/2018 12/25/2017 01/20/2017  Glucose 70 - 99 mg/dL 962(I) 82 88  BUN 6 - 20 mg/dL 11 10 7   Creatinine 0.44 - 1.00 mg/dL 2.97 9.89 2.11  BUN/Creat Ratio 9 - 23 - 12 9  Sodium 135 - 145 mmol/L 138 143 142  Potassium 3.5 - 5.1 mmol/L 4.4 4.8 4.9   Chloride 98 - 111 mmol/L 105 103 106  CO2 22 - 32 mmol/L 26 26 22   Calcium 8.9 - 10.3 mg/dL 9.0 9.5 9.4    CBC Latest Ref Rng & Units 06/11/2018 12/25/2017 12/25/2016  WBC 4.0 - 10.5 K/uL 10.2 9.6 13.1(H)  Hemoglobin 12.0 - 15.0 g/dL 94.1 74.0 81.4  Hematocrit 36.0 - 46.0 % 40.8 40.8 40.0  Platelets 150 - 400 K/uL 363 354 288    CXR:    I have personally reviewed all chest radiographs reported above including CXRs and CT chest unless otherwise indicated  IMPRESSION:     ICD-10-CM   1. Multiple pulmonary nodules R91.8 Fungitell, Serum    Histoplasma Antibodies    QuantiFERON-TB Gold Plus    HIV Antibody (routine testing w rflx)  2. Centrilobular  emphysema (HCC) J43.2 Pulmonary Function Test [facility] Only  3. Smoker F17.200    The lung nodules have progressed in number over 2.5 yrs but the previously identified nodules do not appear to be increasing in size and some have been shown to regress some. There is demonstrated (mild) hypermetabolism in some of the nodules, most significantly in a LUL nodule and there is a RUL nodule that has a somewhat worrisome appearance in that it is slightly spiculated.   Given her possible history of TB exposure and her youth in a histoplasmosis-endemic area. These nodules might represent a granulomatous process. It is also possible that one or both of the upper lobe nodules is/are malignant superimposed on a background of a granulomatous process.  There are no good bronchoscopic options to biopsy any of these nodules but many of the peripheral nodules might be amenable to wedge resection.   Even if one of the nodules is biopsied and proves to be nonmalignant, she will require further CT scan surveillance to ensure that none of the other nodules are malignant  It is notable that this process appears to be very slowly progressive by serial CT scans and is asymptomatic  PLAN:  Blood tests ordered today: HIV, quantiferon gold, fungitell, histoplasmosis  antibody  PFTs (lung function test) ordered  Follow up in 6 weeks after PFTs performed   Billy Fischeravid , MD PCCM service Mobile [phone] Pager [phone] 07/11/2018 2:20 PM

## 2018-07-11 ENCOUNTER — Encounter: Payer: Self-pay | Admitting: Pulmonary Disease

## 2018-07-13 ENCOUNTER — Other Ambulatory Visit
Admission: RE | Admit: 2018-07-13 | Discharge: 2018-07-13 | Disposition: A | Discharge status: Home / Self Care | Payer: No Typology Code available for payment source | Source: Ambulatory Visit | Attending: Pulmonary Disease | Admitting: Pulmonary Disease

## 2018-07-13 DIAGNOSIS — R918 Other nonspecific abnormal finding of lung field: Secondary | ICD-10-CM | POA: Diagnosis present

## 2018-07-14 LAB — HIV ANTIBODY (ROUTINE TESTING W REFLEX): HIV Screen 4th Generation wRfx: NONREACTIVE

## 2018-07-15 LAB — QUANTIFERON-TB GOLD PLUS (RQFGPL)
QUANTIFERON TB2 AG VALUE: 0.05 [IU]/mL
QuantiFERON Mitogen Value: >10 IU/mL
QuantiFERON Nil Value: 0.07 IU/mL
QuantiFERON TB1 Ag Value: 0.06 IU/mL

## 2018-07-15 LAB — QUANTIFERON-TB GOLD PLUS: QuantiFERON-TB Gold Plus: NEGATIVE

## 2018-07-15 LAB — HISTOPLASMA ANTIBODIES, QN,DID: HISTOPLASMA AB ID: NEGATIVE

## 2018-07-22 ENCOUNTER — Other Ambulatory Visit: Payer: Self-pay | Admitting: Nurse Practitioner

## 2018-07-22 ENCOUNTER — Telehealth: Payer: Self-pay | Admitting: Nurse Practitioner

## 2018-07-22 NOTE — Telephone Encounter (Signed)
Copied from CRM [id number]. Topic: Quick Communication - Rx Refill/Question >> Jul 22, 2018  3:13 PM Marylen Ponto wrote: Medication: amphetamine-dextroamphetamine (ADDERALL) 30 MG tablet  Has the patient contacted their pharmacy? yes   Preferred Pharmacy (with phone number or street name): [facility] [address], [place] - [address] [phone] (Phone)   [phone] (Fax)  Agent: Please be advised that RX refills may take up to 3 business days. We ask that you follow-up with your pharmacy.

## 2018-07-22 NOTE — Telephone Encounter (Unsigned)
Copied from CRM #[id number]. Topic: Quick Communication - Rx Refill/Question °>> Jul 22, 2018  3:13 PM Mcneil, Ja-Kwan wrote: °Medication: amphetamine-dextroamphetamine (ADDERALL) 30 MG tablet ° °Has the patient contacted their pharmacy? yes  ° °Preferred Pharmacy (with phone number or street name): [facility] - , [place] - [address] [phone] (Phone)   [phone] (Fax) ° °Agent: Please be advised that RX refills may take up to 3 business days. We ask that you follow-up with your pharmacy. °

## 2018-07-23 MED ORDER — AMPHETAMINE-DEXTROAMPHETAMINE 30 MG PO TABS: 30.0000 mg | ORAL_TABLET | Freq: Two times a day (BID) | ORAL | 0 refills | Status: DC

## 2018-08-02 ENCOUNTER — Telehealth: Payer: Self-pay

## 2018-08-02 NOTE — Telephone Encounter (Signed)
PA submitted via cover my meds for Adderall. Awaiting approval or denial.

## 2018-08-13 ENCOUNTER — Ambulatory Visit: Payer: Managed Care, Other (non HMO) | Admitting: Nurse Practitioner

## 2018-08-13 ENCOUNTER — Encounter: Payer: Self-pay | Admitting: Nurse Practitioner

## 2018-08-17 ENCOUNTER — Other Ambulatory Visit: Payer: Self-pay

## 2018-08-17 ENCOUNTER — Encounter: Payer: Self-pay | Admitting: Nurse Practitioner

## 2018-08-17 ENCOUNTER — Ambulatory Visit (INDEPENDENT_AMBULATORY_CARE_PROVIDER_SITE_OTHER): Payer: No Typology Code available for payment source | Admitting: Nurse Practitioner

## 2018-08-17 VITALS — BP 110/73 | HR 89 | Temp 98.2°F | Ht 64.0 in | Wt 150.1 lb

## 2018-08-17 DIAGNOSIS — F411 Generalized anxiety disorder: Secondary | ICD-10-CM | POA: Diagnosis not present

## 2018-08-17 DIAGNOSIS — G9332 Myalgic encephalomyelitis/chronic fatigue syndrome: Secondary | ICD-10-CM

## 2018-08-17 DIAGNOSIS — R5382 Chronic fatigue, unspecified: Secondary | ICD-10-CM | POA: Diagnosis not present

## 2018-08-17 DIAGNOSIS — F322 Major depressive disorder, single episode, severe without psychotic features: Secondary | ICD-10-CM | POA: Diagnosis not present

## 2018-08-17 DIAGNOSIS — R918 Other nonspecific abnormal finding of lung field: Secondary | ICD-10-CM

## 2018-08-17 DIAGNOSIS — F17210 Nicotine dependence, cigarettes, uncomplicated: Secondary | ICD-10-CM

## 2018-08-17 NOTE — Patient Instructions (Signed)
Depression Screening Depression screening is a tool that your health care provider can use to learn if you have symptoms of depression. Depression is a common condition with many symptoms that are also often found in other conditions. Depression is treatable, but it must first be diagnosed. You may not know that certain feelings, thoughts, and behaviors that you are having can be symptoms of depression. Taking a depression screening test can help you and your health care provider decide if you need more assessment, or if you should be referred to a mental health care provider. What are the screening tests?  You may have a physical exam to see if another condition is affecting your mental health. You may have a blood or urine sample taken during the physical exam.  You may be interviewed using a screening tool that was developed from research, such as one of these: ? Patient Health Questionnaire (PHQ). This is a set of either 2 or 9 questions. A health care provider who has been trained to score this screening test uses a guide to assess if your symptoms suggest that you may have depression. ? Hamilton Depression Rating Scale (HAM-D). This is a set of either 17 or 24 questions. You may be asked to take it again during or after your treatment, to see if your depression has gotten better. ? Beck Depression Inventory (BDI). This is a set of 21 multiple choice questions. Your health care provider scores your answers to assess:  Your level of depression, ranging from mild to severe.  Your response to treatment.  Your health care provider may talk with you about your daily activities, such as eating, sleeping, work, and recreation, and ask if you have had any changes in activity.  Your health care provider may ask you to see a mental health specialist, such as a psychiatrist or psychologist, for more evaluation. Who should be screened for depression?   All adults, including adults with a family history  of a mental health disorder.  Adolescents who are 12-18 years old.  People who are recovering from a myocardial infarction (MI).  Pregnant women, or women who have given birth.  People who have a long-term (chronic) illness.  Anyone who has been diagnosed with another type of a mental health disorder.  Anyone who has symptoms that could show depression. What do my results mean? Your health care provider will review the results of your depression screening, physical exam, and lab tests. Positive screens suggest that you may have depression. Screening is the first step in getting the care that you may need. It is up to you to get your screening results. Ask your health care provider, or the department that is doing your screening tests, when your results will be ready. Talk with your health care provider about your results and diagnosis. A diagnosis of depression is made using the Diagnostic and Statistical Manual of Mental Disorders (DSM-V). This is a book that lists the number and type of symptoms that must be present for a health care provider to give a specific diagnosis.  Your health care provider may work with you to treat your symptoms of depression, or your health care provider may help you find a mental health provider who can assess, diagnose, and treat your depression. Get help right away if:  You have thoughts about hurting yourself or others. If you ever feel like you may hurt yourself or others, or have thoughts about taking your own life, get help right away. You   can go to your nearest emergency department or call:  Your local emergency services (911 in the [place]  A suicide crisis helpline, such as the National Suicide Prevention Lifeline at 1-800-273-8255. This is open 24 hours a day. Summary  Depression screening is the first step in getting the help that you may need.  If your screening test shows symptoms of depression (is positive), your health care provider may ask  you to see a mental health provider.  Anyone who is age 12 or older should be screened for depression. This information is not intended to replace advice given to you by your health care provider. Make sure you discuss any questions you have with your health care provider. Document Released: 10/31/2016 Document Revised: 10/31/2016 Document Reviewed: 10/31/2016 Elsevier Interactive Patient Education  2019 Elsevier Inc.  

## 2018-08-17 NOTE — Assessment & Plan Note (Signed)
Chronic, ongoing.  Continue current medication regimen.   Lithium and Prozac, which benefit patient mood.  She denies SI/HI.

## 2018-08-17 NOTE — Assessment & Plan Note (Signed)
Currently followed by pulmonary.  Continue this collaboration.

## 2018-08-17 NOTE — Assessment & Plan Note (Signed)
Chronic, ongoing.  Continue current medication regimen.   Has been on Adderall long term.

## 2018-08-17 NOTE — Assessment & Plan Note (Signed)
Quit 2 weeks ago.  Praised patient for recent cessation and encouraged continuation of cessation.

## 2018-08-17 NOTE — Assessment & Plan Note (Signed)
Chronic, ongoing.  Continue current medication regimen.  Provided information to patient on [employer] to determine alternative locations and costs for Adderall (she has been on this long term and at present insurance denied coverage and she is paying out of pocket).

## 2018-08-17 NOTE — Progress Notes (Signed)
BP 110/73 (BP Location: Left Arm, Patient Position: Sitting, Cuff Size: Normal)   Pulse 89   Temp 98.2 F (36.8 C) (Oral)   Ht 5\' 4"  (1.626 m)   Wt 150 lb 2 oz (68.1 kg)   SpO2 98%   BMI 25.77 kg/m    Subjective:    Patient ID: Courtney Davidson, female    DOB: [birth year], 43 y.o.   MRN: [id number]  HPI: Courtney RowanLoretta L Nees is a 43 y.o. female  Chief Complaint  Patient presents with  . Depression    followup   DEPRESSION Mood status: controlled Satisfied with current treatment?: yes Symptom severity: mild  Duration of current treatment : chronic Side effects: no Medication compliance: good compliance Psychotherapy/counseling:  Several years ago Anxious mood: yes Anhedonia: no Significant weight loss or gain: no Insomnia: none Fatigue: no Feelings of worthlessness or guilt: no Impaired concentration/indecisiveness: no Suicidal ideations: no Hopelessness: no Crying spells: no [facility] 08/17/2018 08/17/2018 08/17/2018 05/07/2018 03/05/2018  Decreased Interest 3 3 1  0 0  Down, Depressed, Hopeless 0 0 0 1 1  PHQ - 2 Score 3 3 1 1 1   Altered sleeping - 0 - 0 0  Tired, decreased energy - 1 - 2 2  Change in appetite - 0 - 0 2  Feeling bad or failure about yourself  - 0 - 0 0  Trouble concentrating - 0 - 0 0  Moving slowly or fidgety/restless - 0 - 0 0  Suicidal thoughts - 0 - 0 0  PHQ-9 Score - 4 - 3 5  Difficult doing work/chores - Not difficult at all - - -  Some recent data might be hidden   GAD 7 : Generalized Anxiety Score 08/17/2018 03/05/2018 12/25/2017  Nervous, Anxious, on Edge 3 1 1   Control/stop worrying 3 1 2   Worry too much - different things 3 1 2   Trouble relaxing 0 1 1  Restless 0 0 2  Easily annoyed or irritable 1 2 2   Afraid - awful might happen 0 0 0  Total GAD 7 Score 10 6 10   Anxiety Difficulty Somewhat difficult - -   MULTIPLE PULMONARY NODULES: She is being followed by Dr. Sung AmabileSimonds with pulmonary for multiple pulmonary nodules,  last visit 07/13/2018.  Currently not smoking, has not smoked for 2 weeks.  Has h/o smoking 1 1/2 PPD for 34 years.  States her grandchild was her motivation to quit.  Relevant past medical, surgical, family and social history reviewed and updated as indicated. Interim medical history since our last visit reviewed. Allergies and medications reviewed and updated.  Review of Systems  Constitutional: Negative for activity change, appetite change, diaphoresis, fatigue and fever.  Respiratory: Negative for cough, chest tightness and shortness of breath.   Cardiovascular: Negative for chest pain, palpitations and leg swelling.  Gastrointestinal: Negative for abdominal distention, abdominal pain, constipation, diarrhea, nausea and vomiting.  Endocrine: Negative for cold intolerance, heat intolerance, polydipsia, polyphagia and polyuria.  Neurological: Negative for dizziness, syncope, weakness, light-headedness, numbness and headaches.  Psychiatric/Behavioral: Negative.     Per HPI unless specifically indicated above     Objective:    BP 110/73 (BP Location: Left Arm, Patient Position: Sitting, Cuff Size: Normal)   Pulse 89   Temp 98.2 F (36.8 C) (Oral)   Ht 5\' 4"  (1.626 m)   Wt 150 lb 2 oz (68.1 kg)   SpO2 98%   BMI 25.77 kg/m   Wt Readings from Last 3 Encounters:  08/17/18 150 lb 2 oz (68.1 kg)  07/08/18 153 lb 12.8 oz (69.8 kg)  06/11/18 154 lb 3 oz (69.9 kg)    Physical Exam Vitals signs and nursing note reviewed.  Constitutional:      General: She is awake.     Appearance: She is well-developed.  HENT:     Head: Normocephalic.     Right Ear: Hearing normal.     Left Ear: Hearing normal.     Nose: Nose normal.     Mouth/Throat:     Mouth: Mucous membranes are moist.  Eyes:     General: Lids are normal.        Right eye: No discharge.        Left eye: No discharge.     Conjunctiva/sclera: Conjunctivae normal.     Pupils: Pupils are equal, round, and reactive to light.    Neck:     Musculoskeletal: Normal range of motion and neck supple.     Thyroid: No thyromegaly.     Vascular: No carotid bruit or JVD.  Cardiovascular:     Rate and Rhythm: Normal rate and regular rhythm.     Heart sounds: Normal heart sounds. No murmur. No gallop.   Pulmonary:     Effort: Pulmonary effort is normal.     Breath sounds: Normal breath sounds.  Abdominal:     General: Bowel sounds are normal.     Palpations: Abdomen is soft. There is no hepatomegaly or splenomegaly.  Musculoskeletal:     Right lower leg: No edema.     Left lower leg: No edema.  Lymphadenopathy:     Cervical: No cervical adenopathy.  Skin:    General: Skin is warm and dry.  Neurological:     Mental Status: She is alert and oriented to person, place, and time.  Psychiatric:        Attention and Perception: Attention normal.        Mood and Affect: Mood normal.        Behavior: Behavior normal. Behavior is cooperative.        Thought Content: Thought content normal.        Judgment: Judgment normal.     Results for orders placed or performed during the hospital encounter of 07/13/18  QuantiFERON-TB Gold Plus  Result Value Ref Range   QuantiFERON Incubation Incubation performed.    QuantiFERON-TB Gold Plus Negative Negative  HIV Antibody (routine testing w rflx)  Result Value Ref Range   HIV Screen 4th Generation wRfx Non Reactive Non Reactive  Histoplasma antibodies, Qn,DID  Result Value Ref Range   Histoplasma Ab, Immunodiffusion Negative Neg:<1:1  QuantiFERON-TB Gold Plus  Result Value Ref Range   QuantiFERON Criteria Comment    QuantiFERON TB1 Ag Value 0.06 IU/mL   QuantiFERON TB2 Ag Value 0.05 IU/mL   QuantiFERON Nil Value 0.07 IU/mL   QuantiFERON Mitogen Value >10.00 IU/mL      Assessment & Plan:   Problem List Items Addressed This Visit      Nervous and Auditory   CFS (chronic fatigue syndrome)    Chronic, ongoing.  Continue current medication regimen.   Has been on Adderall  long term.        Other   Generalized anxiety disorder    Chronic, ongoing.  Continue current medication regimen.   Lithium and Prozac, which benefit patient mood.  She denies SI/HI.      Nicotine dependence, cigarettes, uncomplicated    Quit 2 weeks ago.  Praised patient for recent cessation and encouraged continuation of cessation.      Depression, major, single episode, severe (HCC) - Primary    Chronic, ongoing.  Continue current medication regimen.  Provided information to patient on [employer] to determine alternative locations and costs for Adderall (she has been on this long term and at present insurance denied coverage and she is paying out of pocket).      Pulmonary nodules    Currently followed by pulmonary.  Continue this collaboration.          Follow up plan: Return in about 3 months (around 11/15/2018) for Depression.

## 2018-08-19 ENCOUNTER — Ambulatory Visit: Payer: PRIVATE HEALTH INSURANCE | Attending: Pulmonary Disease

## 2018-08-19 DIAGNOSIS — J432 Centrilobular emphysema: Secondary | ICD-10-CM | POA: Insufficient documentation

## 2018-08-19 MED ORDER — ALBUTEROL SULFATE (2.5 MG/3ML) 0.083% IN NEBU
2.5000 mg | INHALATION_SOLUTION | Freq: Once | RESPIRATORY_TRACT | Status: AC
  Administered 2018-08-19: 2.5 mg via RESPIRATORY_TRACT
  Filled 2018-08-19: qty 3

## 2018-08-24 ENCOUNTER — Other Ambulatory Visit: Payer: Self-pay

## 2018-08-24 ENCOUNTER — Other Ambulatory Visit: Payer: Self-pay | Admitting: Nurse Practitioner

## 2018-08-24 MED ORDER — AMPHETAMINE-DEXTROAMPHETAMINE 30 MG PO TABS: 30.0000 mg | ORAL_TABLET | Freq: Two times a day (BID) | ORAL | 0 refills | Status: DC

## 2018-08-24 NOTE — Telephone Encounter (Signed)
Refill request for Adderall

## 2018-08-24 NOTE — Progress Notes (Signed)
Refill request for Adderall.  Controlled substance.  Checked data base and no other refills of controlled substances noted.

## 2018-08-27 ENCOUNTER — Ambulatory Visit (INDEPENDENT_AMBULATORY_CARE_PROVIDER_SITE_OTHER): Payer: No Typology Code available for payment source | Admitting: Pulmonary Disease

## 2018-08-27 ENCOUNTER — Encounter: Payer: Self-pay | Admitting: Pulmonary Disease

## 2018-08-27 VITALS — BP 112/76 | HR 80 | Ht 64.0 in | Wt 150.8 lb

## 2018-08-27 DIAGNOSIS — J432 Centrilobular emphysema: Secondary | ICD-10-CM

## 2018-08-27 DIAGNOSIS — F17200 Nicotine dependence, unspecified, uncomplicated: Secondary | ICD-10-CM

## 2018-08-27 DIAGNOSIS — R918 Other nonspecific abnormal finding of lung field: Secondary | ICD-10-CM

## 2018-08-27 MED ORDER — VARENICLINE TARTRATE 0.5 MG X 11 & 1 MG X 42 PO MISC: ORAL | 0 refills | Status: DC

## 2018-08-27 MED ORDER — VARENICLINE TARTRATE 1 MG PO TABS: 1.0000 mg | ORAL_TABLET | Freq: Two times a day (BID) | ORAL | 5 refills | Status: DC

## 2018-08-27 NOTE — Progress Notes (Signed)
PULMONARY OFFICE FOLLOW UP NOTE  Requesting MD/Service: Merlene Pulling, MD Date of initial consultation: 07/08/18 Reason for consultation: multiple pulmonary nodules, smoker  PT PROFILE: 43 y.o. female smoker (At time of initial eval, approx 5 cigs per day. Previously 1.5 PPD. Started @ age 6) found incidentally to have a LLL nodule on CTAP performed to R/O kidney stone. Subsequent CT chests (2018 and 2019) revealed increasing number of pulmonary nodules with concern for metastatic cancer raised. PET revealed mild hypermetabolism of some of the nodules. Referred to Oncology, then to Pulmonary Medicine for further evaluation  DATA: 01/15/16 CTAP (renal stone study): Mild asymmetric right perinephric stranding, suspicious for pyelonephritis. No evidence of urolithiasis or hydronephrosis.5 mm indeterminate left lower lobe pulmonary nodule. No follow-up needed if patient is low-risk. Non-contrast chest CT can be considered in 12 months if patient is high-risk 02/03/17 CT chest: Previously noted left lower lobe nodule is stable to slightly decreased in size, currently measuring 4 mm, considered benign. Several other scattered 2-4 mm pulmonary nodules are noted throughout the lungs bilaterally, the largest of which is in the right middle lobe associated with the minor fissure measuring 4 mm. These are nonspecific, but are statistically likely benign. Non-contrast chest CT can be considered in 12 months in this high risk patient 06/02/18 CT chest: Mild emphysema, Interval development of multiple nodules throughout both lungs, the largest being 7 mm spiculated nodule in right upper lobe. This is highly concerning for metastatic disease 06/10/18 PET: Redemonstration of multiple small pulmonary nodules, many of which are minimally hypermetabolic. No evidence of hypermetabolic primary malignancy. Given lack of primary malignancy, nodules are likely infectious/inflammatory. Metastatic disease from otherwise occult  primary cannot be excluded 08/19/18 PFTs: FVC: 3.83 > 3.90 L (106 > 108 %pred), FEV1: 2.81 > 2.83 L (96 > 97 %pred), FEV1/FVC: 73%, TLC: 5.05 L (100 %pred), DLCO 67 %pred.  Flow volume curve suggests early small airways disease  PULMONARY PROBLEMS: Smoker Multiple pulmonary nodules - HIV, QuantiFERON gold, histoplasmosis antibody all negative Mild emphysema by CT scan    INTERVAL: Last visit 07/08/18.  No major pulmonary events in the interim  SUBJ: This is a scheduled follow-up.  Mostly, she is here to discuss smoking and review pulmonary function test.  She is no longer smoking cigarettes but is vaping as an alternative.  Pulmonary function tests are documented above and were reviewed with the patient.    Vitals:   08/27/18 0838 08/27/18 0840  BP:  112/76  Pulse:  80  SpO2:  99%  Weight: 150 lb 12.8 oz (68.4 kg)   Height: 5\' 4"  (1.626 m)   Room air   EXAM:  Gen: WDWN in NAD HEENT: NCAT, sclerae white, oropharynx normal Neck: No LAN, no JVD noted Lungs: full BS, normal percussion note throughout, no adventitious sounds Cardiovascular: Regular, normal rate, no M noted Abdomen: Soft, NT, +BS Ext: no C/C/E Neuro: PERRL, EOMI, motor/sensory grossly intact Skin: No lesions noted   DATA:   BMP Latest Ref Rng & Units 06/11/2018 12/25/2017 01/20/2017  Glucose 70 - 99 mg/dL 960(A) 82 88  BUN 6 - 20 mg/dL 11 10 7   Creatinine 0.44 - 1.00 mg/dL 5.40 9.81 1.91  BUN/Creat Ratio 9 - 23 - 12 9  Sodium 135 - 145 mmol/L 138 143 142  Potassium 3.5 - 5.1 mmol/L 4.4 4.8 4.9  Chloride 98 - 111 mmol/L 105 103 106  CO2 22 - 32 mmol/L 26 26 22   Calcium 8.9 - 10.3 mg/dL 9.0 9.5 9.4  CBC Latest Ref Rng & Units 06/11/2018 12/25/2017 12/25/2016  WBC 4.0 - 10.5 K/uL 10.2 9.6 13.1(H)  Hemoglobin 12.0 - 15.0 g/dL 94.0 76.8 08.8  Hematocrit 36.0 - 46.0 % 40.8 40.8 40.0  Platelets 150 - 400 K/uL 363 354 288    CXR:    I have personally reviewed all chest radiographs reported above  including CXRs and CT chest unless otherwise indicated  IMPRESSION:     ICD-10-CM   1. Multiple pulmonary nodules R91.8   2. Centrilobular emphysema (HCC) J43.2   3. Smoker F17.200    I discussed my concern regarding vaping as an alternative to cigarette smoking.  I explained that it is probably less harmful than smoking cigarettes but still not a desirable behavior.  We discussed strategies to liberate her from nicotine addiction.  Ultimately, she wishes to try Chantix.  We again discussed the multiple pulmonary nodules.  She remains very concerned about the possibility of malignancy.  I explained that it is my opinion that it is very unlikely that these represent metastatic cancer.  More likely, they represent an old granulomatous process.  Nonetheless, they do need to be followed over time with serial CT chest  PLAN:  New prescription: Chantix.  I have ordered both the starter pack and continuation pack.  She was instructed to only fill the starter pack for now.  We discussed potential side effects of this medication.  She is to contact us if she has any problems tolerating it.  We will plan on a repeat CT scan of the chest in May of this year  She will follow-up in this office in 6 to 8 weeks to continue to address smoking and assess how she is doing with Chantix   Billy Fischer, MD PCCM service Mobile [phone] Pager [phone] 08/29/2018 4:47 PM

## 2018-08-27 NOTE — Patient Instructions (Signed)
We will plan on repeat CT of the chest in May of this year  New prescription: Chantix.  I have ordered both the starter pack and continuation pack.  Just fill the starter pack now.  Follow-up in this office in 6 to 8 weeks to continue to address smoking and assess how you are doing with Chantix therapy

## 2018-08-29 ENCOUNTER — Encounter: Payer: Self-pay | Admitting: Pulmonary Disease

## 2018-08-30 ENCOUNTER — Other Ambulatory Visit: Payer: Self-pay | Admitting: Family Medicine

## 2018-09-08 ENCOUNTER — Telehealth: Payer: Self-pay

## 2018-09-08 NOTE — Telephone Encounter (Signed)
Per Gabriel Rainwater, patient wishes to follow up with Dr. Sung Amabile to determine if f/u is needed by Dr. Merlene Pulling.

## 2018-09-08 NOTE — Telephone Encounter (Signed)
-----   Message from [facility] sent at 09/08/2018  4:34 PM EDT ----- Regarding: RE: Patient lost to follow-up Sorry I talked to her and she didn't understand why she needed to se Dr C. She said she wanted to see Dr Sung Amabile first and ask him if she needs to see Dr c. She said she would call me and let me know. ----- Message ----- From: Wynell Balloon, RN Sent: 09/08/2018   3:02 PM EDT To: [facility] Subject: RE: Patient lost to follow-up                  Any updates?  ----- Message ----- From: [facility] Sent: 09/06/2018   4:20 PM EDT To: Wynell Balloon, RN Subject: RE: Patient lost to follow-up                  Called pt and it said no VM has been set up . Ill try again tomorrow ----- Message ----- From: Wynell Balloon, RN Sent: 09/06/2018   9:56 AM EDT To: [facility] Subject: FW: Patient lost to follow-up                  Can you please schedule patient for follow up appt. Whenever is convenient for her is fine. No rush.  ----- Message ----- From: Rosey Bath, MD Sent: 09/03/2018   3:00 PM EDT To: Wynell Balloon, RN, # Subject: Patient lost to follow-up                       Please ask patient if she would like to be seen in follow-up.  She was referred to Dr Sung Amabile, pulmonary medicine, as was to see [place] in follow-up.  M

## 2018-09-23 ENCOUNTER — Other Ambulatory Visit: Payer: Self-pay

## 2018-09-23 MED ORDER — AMPHETAMINE-DEXTROAMPHETAMINE 30 MG PO TABS: 30.0000 mg | ORAL_TABLET | Freq: Two times a day (BID) | ORAL | 0 refills | Status: DC

## 2018-09-27 ENCOUNTER — Telehealth: Payer: Self-pay

## 2018-09-27 NOTE — Telephone Encounter (Signed)
Prior Authorization initiated via CoverMyMeds for Adderrall. Key: [id number]

## 2018-09-29 NOTE — Telephone Encounter (Signed)
PA was denied.  Reasoning: The requested medication is not a covered benefit. The [place] Food and Drug Administration (FDA) has not approved this medication for use in your condition, and the clinical information submitted by your doctor does not meet the criteria established for off-label drug use, in accordance with the terms and conditions of your plan benefit.  We should received a fax copy to notice of denial. Routing to provider to advise.

## 2018-09-29 NOTE — Telephone Encounter (Signed)
Patient is aware and has been paying out of pocket for this, as has been on long term.  At her next visit I will place CCM referral with pharmacist to see if she can provide some guidance, as patient has been on this for many years for this particular disorder and has success with this treatment.

## 2018-10-22 ENCOUNTER — Ambulatory Visit: Payer: No Typology Code available for payment source | Admitting: Pulmonary Disease

## 2018-10-25 ENCOUNTER — Other Ambulatory Visit: Payer: Self-pay

## 2018-10-25 MED ORDER — AMPHETAMINE-DEXTROAMPHETAMINE 30 MG PO TABS: 30.0000 mg | ORAL_TABLET | Freq: Two times a day (BID) | ORAL | 0 refills | Status: DC

## 2018-10-25 NOTE — Telephone Encounter (Signed)
Routing to provider  

## 2018-11-16 ENCOUNTER — Other Ambulatory Visit: Payer: Self-pay

## 2018-11-16 ENCOUNTER — Ambulatory Visit (INDEPENDENT_AMBULATORY_CARE_PROVIDER_SITE_OTHER): Payer: No Typology Code available for payment source | Admitting: Nurse Practitioner

## 2018-11-16 ENCOUNTER — Encounter: Payer: Self-pay | Admitting: Nurse Practitioner

## 2018-11-16 VITALS — Ht 64.0 in | Wt 150.0 lb

## 2018-11-16 DIAGNOSIS — R5382 Chronic fatigue, unspecified: Secondary | ICD-10-CM

## 2018-11-16 DIAGNOSIS — G9332 Myalgic encephalomyelitis/chronic fatigue syndrome: Secondary | ICD-10-CM

## 2018-11-16 DIAGNOSIS — M25512 Pain in left shoulder: Secondary | ICD-10-CM

## 2018-11-16 DIAGNOSIS — M25511 Pain in right shoulder: Secondary | ICD-10-CM | POA: Diagnosis not present

## 2018-11-16 DIAGNOSIS — M25561 Pain in right knee: Secondary | ICD-10-CM | POA: Diagnosis not present

## 2018-11-16 DIAGNOSIS — M25562 Pain in left knee: Secondary | ICD-10-CM

## 2018-11-16 DIAGNOSIS — F322 Major depressive disorder, single episode, severe without psychotic features: Secondary | ICD-10-CM | POA: Diagnosis not present

## 2018-11-16 DIAGNOSIS — G8929 Other chronic pain: Secondary | ICD-10-CM | POA: Insufficient documentation

## 2018-11-16 NOTE — Assessment & Plan Note (Signed)
Pt reports ongoing for years, but worsening.  Obtain outpatient labs and have return to office in one week for face to face visit.  Recommend Tylenol as needed and minimal use Aleeve.  May alternate heat and ice to joints as needed.  Consider obtaining imaging based on face to face visit physical findings and lab results.

## 2018-11-16 NOTE — Assessment & Plan Note (Signed)
Pt reports ongoing for years, but worsening.  Obtain outpatient labs and have return to office in one week for face to face visit.  Recommend Tylenol as needed and minimal use Aleeve.  May alternate heat and ice to joints as needed.

## 2018-11-16 NOTE — Assessment & Plan Note (Signed)
Chronic, ongoing.  Has been on long term Adderall which offers benefit.  Continue current regimen and refill monthly.  Obtain UDS and contract next visit.

## 2018-11-16 NOTE — Assessment & Plan Note (Signed)
Chronic, ongoing.  Continue current medication regimen, Prozac, and adjust as needed.   

## 2018-11-16 NOTE — Patient Instructions (Signed)
Arthritis  Arthritis means joint pain. It can also mean joint disease. A joint is a place where bones come together. People who have arthritis may have:  · Red joints.  · Swollen joints.  · Stiff joints.  · Warm joints.  · A fever.  · A feeling of being sick.  Follow these instructions at home:  Pay attention to any changes in your symptoms. Take these actions to help with your pain and swelling.  Medicines  · Take over-the-counter and prescription medicines only as told by your doctor.  · Do not take aspirin for pain if your doctor says that you may have gout.  Activity  · Rest your joint if your doctor tells you to.  · Avoid activities that make the pain worse.  · Exercise your joint regularly as told by your doctor. Try doing exercises like:  ? Swimming.  ? Water aerobics.  ? Biking.  ? Walking.  Joint Care    · If your joint is swollen, keep it raised (elevated) if told by your doctor.  · If your joint feels stiff in the morning, try taking a warm shower.  · If you have diabetes, do not apply heat without asking your doctor.  · If told, apply heat to the joint:  ? Put a towel between the joint and the hot pack or heating pad.  ? Leave the heat on the area for 20-30 minutes.  · If told, apply ice to the joint:  ? Put ice in a plastic bag.  ? Place a towel between your skin and the bag.  ? Leave the ice on for 20 minutes, 2-3 times per day.  · Keep all follow-up visits as told by your doctor.  Contact a doctor if:  · The pain gets worse.  · You have a fever.  Get help right away if:  · You have very bad pain in your joint.  · You have swelling in your joint.  · Your joint is red.  · Many joints become painful and swollen.  · You have very bad back pain.  · Your leg is very weak.  · You cannot control your pee (urine) or poop (stool).  This information is not intended to replace advice given to you by your health care provider. Make sure you discuss any questions you have with your health care provider.  Document  Released: 09/10/2009 Document Revised: 11/22/2015 Document Reviewed: 09/11/2014  Elsevier Interactive Patient Education © 2019 [employer]

## 2018-11-16 NOTE — Progress Notes (Signed)
Ht _0  (1.626 m)    Wt 150 lb (68 kg)    BMI 25.75 kg/m    Subjective:    Patient ID: Courtney Davidson, female    DOB: 04/07/1976, 43 y.o.   MRN: [id number]  HPI: Courtney Davidson is a 43 y.o. female  Chief Complaint  Patient presents with   Depression    Follow-up   Generalized Body Aches    Patient states that over the last few weeks her entire body hurts. Shoulders and knees especially.     This visit was completed via Doximity due to the restrictions of the COVID-19 pandemic. All issues as above were discussed and addressed. Physical exam was done as above through visual confirmation on Doximity. If it was felt that the patient should be evaluated in the office, they were directed there. The patient verbally consented to this visit.  Location of the patient: home  Location of the provider: home  Those involved with this call:   Provider: Marnee Guarneri, DNP  CMA: Merilyn Baba, [facility]/Registration: Linard Millers   Time spent on call: 15 minutes with patient face to face via video conference. More than 50% of this time was spent in counseling and coordination of care. 10 minutes total spent in review of patient's record and preparation of their chart. I verified patient identity using two factors (patient name and date of birth). Patient consents verbally to being seen via telemedicine visit today.   DEPRESSION Continues on Prozac and Adderall.   Currently Adderall paid for out of pocket, $30.  Adderall currently more for chronic fatigue syndrome, she has been on this for several years and does not wish to discontinue.  Reports improvement in fatigue with this medication.  Mood status: controlled Satisfied with current treatment?: yes Symptom severity: moderate  Duration of current treatment : chronic Side effects: no Medication compliance: good compliance Psychotherapy/counseling: not currently, several years ago Previous psychiatric medications:  multiple past meds per report, does not recall them all Depressed mood: occasional Anxious mood: no Anhedonia: no Significant weight loss or gain: no Insomnia: yes hard to fall asleep Fatigue: occasionally Feelings of worthlessness or guilt: no Impaired concentration/indecisiveness: no Suicidal ideations: no Hopelessness: no Crying spells: no Depression screen [facility] 2/9 11/16/2018 08/17/2018 08/17/2018 08/17/2018 05/07/2018  Decreased Interest 0 _1 0  Down, Depressed, Hopeless 0 0 0 0 1  PHQ - 2 Score 0 _2 Altered sleeping 2 - 0 - 0  Tired, decreased energy 2 - 1 - 2  Change in appetite 0 - 0 - 0  Feeling bad or failure about yourself  0 - 0 - 0  Trouble concentrating 0 - 0 - 0  Moving slowly or fidgety/restless 0 - 0 - 0  Suicidal thoughts 0 - 0 - 0  PHQ-9 Score 4 - 4 - 3  Difficult doing work/chores Somewhat difficult - Not difficult at all - -  Some recent data might be hidden   GENERALIZED BODY ACHES: Reports for past 2-3 weeks has had slight increase in fatigue and body aches, especially noted in knees and shoulders.   Has had issues for a couple years with knees and shoulders. Has not had episodes like this in past, reports "they have bothered me but in past 2-3 weeks has gotten really".  Reports she has taken Aleeve, which helps "a little, but not to the way it did before".  Pain in knees and shoulders is  a 7/10 at worst, intermittent, worse in the evening after work.  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  Does stand a lot at job and use upper body.  Denies SOB, chest pain, past or current tick bites.  Denies recent URI. Fatigue: yes Cold intolerance: no Heat intolerance: no Weight gain: no Weight loss: no Constipation: no Diarrhea/loose stools: no Palpitations: no Lower extremity edema: no Anxiety/depressed mood: yes  Relevant past medical, surgical, family and social history reviewed and updated as indicated. Interim medical  history since our last visit reviewed. Allergies and medications reviewed and updated.  Review of Systems  Constitutional: Positive for fatigue (occasional). Negative for activity change, appetite change, diaphoresis and fever.  Respiratory: Negative for cough, chest tightness and shortness of breath.   Cardiovascular: Negative for chest pain, palpitations and leg swelling.  Gastrointestinal: Negative for abdominal distention, abdominal pain, constipation, diarrhea, nausea and vomiting.  Endocrine: Negative for cold intolerance, heat intolerance, polydipsia, polyphagia and polyuria.  Musculoskeletal: Positive for arthralgias.  Neurological: Negative for dizziness, syncope, weakness, light-headedness, numbness and headaches.  Psychiatric/Behavioral: Positive for sleep disturbance (occasionally). Negative for decreased concentration, self-injury and suicidal ideas. The patient is not nervous/anxious.     Per HPI unless specifically indicated above     Objective:    Ht _0  (1.626 m)    Wt 150 lb (68 kg)    BMI 25.75 kg/m   Wt Readings from Last 3 Encounters:  11/16/18 150 lb (68 kg)  08/27/18 150 lb 12.8 oz (68.4 kg)  08/17/18 150 lb 2 oz (68.1 kg)    Physical Exam Vitals signs and nursing note reviewed.  Constitutional:      General: She is awake. She is not in acute distress.    Appearance: She is well-developed. She is obese. She is not ill-appearing.  HENT:     Head: Normocephalic.     Right Ear: Hearing normal.     Left Ear: Hearing normal.  Eyes:     General: Lids are normal.        Right eye: No discharge.        Left eye: No discharge.     Conjunctiva/sclera: Conjunctivae normal.  Neck:     Musculoskeletal: Normal range of motion.  Cardiovascular:     Comments: Unable to auscultate due to virtual exam only Pulmonary:     Effort: Pulmonary effort is normal. No accessory muscle usage or respiratory distress.     Comments: Unable to auscultate due to virtual exam  only Musculoskeletal:     Right shoulder: She exhibits normal range of motion, no tenderness, no swelling and no pain.     Left shoulder: She exhibits normal range of motion, no tenderness, no swelling and no pain.     Right knee: She exhibits normal range of motion, no swelling and no erythema. No tenderness found.     Left knee: She exhibits normal range of motion, no swelling, no laceration and no erythema. No tenderness found.     Comments: Performed with assistance of provider via virtual visit.  No pain noted on exam today per patient.  Full ROM.    Skin:    General: Skin is warm and dry.     Findings: No rash.     Comments: She reports no rashes noted.  Neurological:     Mental Status: She is alert and oriented to person, place, and time.  Psychiatric:        Attention  and Perception: Attention normal.        Mood and Affect: Mood normal.        Behavior: Behavior normal. Behavior is cooperative.        Thought Content: Thought content normal.        Judgment: Judgment normal.     Results for orders placed or performed during the hospital encounter of 07/13/18  QuantiFERON-TB Gold Plus  Result Value Ref Range   QuantiFERON Incubation Incubation performed.    QuantiFERON-TB Gold Plus Negative Negative  HIV Antibody (routine testing w rflx)  Result Value Ref Range   HIV Screen 4th Generation wRfx Non Reactive Non Reactive  Histoplasma antibodies, Qn,DID  Result Value Ref Range   Histoplasma Ab, Immunodiffusion Negative Neg:<1:1  QuantiFERON-TB Gold Plus  Result Value Ref Range   QuantiFERON Criteria Comment    QuantiFERON TB1 Ag Value 0.06 IU/mL   QuantiFERON TB2 Ag Value 0.05 IU/mL   QuantiFERON Nil Value 0.07 IU/mL   QuantiFERON Mitogen Value >10.00 IU/mL      Assessment & Plan:   Problem List Items Addressed This Visit      Nervous and Auditory   CFS (chronic fatigue syndrome)    Chronic, ongoing.  Has been on long term Adderall which offers benefit.  Continue  current regimen and refill monthly.  Obtain UDS and contract next visit.      Relevant Orders   Thyroid Panel With TSH   CBC with Differential/Platelet   Comprehensive metabolic panel   VITAMIN D 25 Hydroxy (Vit-D Deficiency, Fractures)     Other   Depression, major, single episode, severe (HCC) - Primary    Chronic, ongoing.  Continue current medication regimen, Prozac, and adjust as needed.         Chronic pain of both knees    Pt reports ongoing for years, but worsening.  Obtain outpatient labs and have return to office in one week for face to face visit.  Recommend Tylenol as needed and minimal use Aleeve.  May alternate heat and ice to joints as needed.  Consider obtaining imaging based on face to face visit physical findings and lab results.      Relevant Orders   Rheumatoid factor   ANA w/Reflex if Positive   Sed Rate (ESR)   C-reactive protein   CBC with Differential/Platelet   Uric acid   Chronic pain of both shoulders    Pt reports ongoing for years, but worsening.  Obtain outpatient labs and have return to office in one week for face to face visit.  Recommend Tylenol as needed and minimal use Aleeve.  May alternate heat and ice to joints as needed.        Relevant Orders   Rheumatoid factor   ANA w/Reflex if Positive   Sed Rate (ESR)   C-reactive protein   Uric acid      I discussed the assessment and treatment plan with the patient. The patient was provided an opportunity to ask questions and all were answered. The patient agreed with the plan and demonstrated an understanding of the instructions.   The patient was advised to call back or seek an in-person evaluation if the symptoms worsen or if the condition fails to improve as anticipated.   I provided 15 minutes of time during this encounter.  Follow up plan: Return in about 1 week (around 11/23/2018) for Joint pain.

## 2018-11-23 ENCOUNTER — Other Ambulatory Visit: Payer: Self-pay

## 2018-11-23 MED ORDER — AMPHETAMINE-DEXTROAMPHETAMINE 30 MG PO TABS: 30.0000 mg | ORAL_TABLET | Freq: Two times a day (BID) | ORAL | 0 refills | Status: DC

## 2018-11-24 ENCOUNTER — Ambulatory Visit (INDEPENDENT_AMBULATORY_CARE_PROVIDER_SITE_OTHER): Payer: No Typology Code available for payment source | Admitting: Nurse Practitioner

## 2018-11-24 ENCOUNTER — Encounter: Payer: Self-pay | Admitting: Nurse Practitioner

## 2018-11-24 ENCOUNTER — Other Ambulatory Visit: Payer: Self-pay

## 2018-11-24 VITALS — BP 113/77 | HR 91 | Temp 98.0°F

## 2018-11-24 DIAGNOSIS — M25561 Pain in right knee: Secondary | ICD-10-CM

## 2018-11-24 DIAGNOSIS — G8929 Other chronic pain: Secondary | ICD-10-CM

## 2018-11-24 DIAGNOSIS — F322 Major depressive disorder, single episode, severe without psychotic features: Secondary | ICD-10-CM

## 2018-11-24 DIAGNOSIS — M25562 Pain in left knee: Secondary | ICD-10-CM

## 2018-11-24 DIAGNOSIS — M25511 Pain in right shoulder: Secondary | ICD-10-CM | POA: Diagnosis not present

## 2018-11-24 DIAGNOSIS — M25512 Pain in left shoulder: Secondary | ICD-10-CM

## 2018-11-24 DIAGNOSIS — R5382 Chronic fatigue, unspecified: Secondary | ICD-10-CM

## 2018-11-24 DIAGNOSIS — G9332 Myalgic encephalomyelitis/chronic fatigue syndrome: Secondary | ICD-10-CM

## 2018-11-24 MED ORDER — DICLOFENAC SODIUM 1 % TD GEL: 2.0000 g | Freq: Three times a day (TID) | TRANSDERMAL | 2 refills | Status: DC

## 2018-11-24 NOTE — Progress Notes (Signed)
BP 113/77   Pulse 91   Temp 98 F (36.7 C) (Oral)   SpO2 98%    Subjective:    Patient ID: Courtney Davidson, female    DOB: 12-29-1975, 43 y.o.   MRN: [id number]  HPI: Courtney Davidson is a 43 y.o. female  Chief Complaint  Patient presents with  . Joint Pain    bilateral shoulder (right is worse than left) and bilateral knee. States she has had the pains for a while but has gotten worse within the last month    Has had chronic pain in bilateral shoulders and knees for "years", but now getting to point where "really hard to deal with it".  Has taken Aleeve in past, then in past 6 weeks pain has really "gotten bad".  Not currently taking Aleeve due to  Concerns for kidney health and BP.  SHOULDER PAIN Duration: chronic Involved shoulder: bilateral R>L Mechanism of injury: unknown Location: lateral Onset:gradual Severity: 8/10  Quality:  aching Frequency: intermittent, does note at night when rolling over R>L Radiation: no Aggravating factors: lifting, movement and sleep  Alleviating factors: Biofreeze & Tylenol Status: worse Treatments attempted: ice, APAP and aleve  Relief with NSAIDs?:  mild Weakness: no Numbness: no Decreased grip strength: no Redness: no Swelling: no Bruising: no Fevers: no   KNEE PAIN Duration: chronic Involved knee: bilateral Mechanism of injury: has history of dislocated left knee Location:diffuse Onset: gradual Severity: 7/10  Quality:  dull, aching and throbbing Frequency: intermittent Radiation: no Aggravating factors: prolonged standing and bending  Alleviating factors: Biofreeze and APAP  Status: worse Treatments attempted: APAP  Relief with NSAIDs?:  mild Weakness with weight bearing or walking: no Sensation of giving way: no Locking: yes, when she bends down it is hard to get back up Popping: no Bruising: no Swelling: no Redness: no Paresthesias/decreased sensation: no Fevers: no  Relevant past medical, surgical,  family and social history reviewed and updated as indicated. Interim medical history since our last visit reviewed. Allergies and medications reviewed and updated.  Review of Systems  Constitutional: Negative for activity change, appetite change, diaphoresis, fatigue and fever.  Respiratory: Negative for cough, chest tightness and shortness of breath.   Cardiovascular: Negative for chest pain, palpitations and leg swelling.  Gastrointestinal: Negative for abdominal distention, abdominal pain, constipation, diarrhea, nausea and vomiting.  Musculoskeletal: Positive for arthralgias.  Neurological: Negative for dizziness, syncope, weakness, light-headedness, numbness and headaches.  Psychiatric/Behavioral: Negative.     Per HPI unless specifically indicated above     Objective:    BP 113/77   Pulse 91   Temp 98 F (36.7 C) (Oral)   SpO2 98%   Wt Readings from Last 3 Encounters:  11/16/18 150 lb (68 kg)  08/27/18 150 lb 12.8 oz (68.4 kg)  08/17/18 150 lb 2 oz (68.1 kg)    Physical Exam Vitals signs and nursing note reviewed.  Constitutional:      General: She is awake. She is not in acute distress.    Appearance: She is well-developed. She is not ill-appearing.  HENT:     Head: Normocephalic.     Right Ear: Hearing normal.     Left Ear: Hearing normal.     Nose: Nose normal.     Mouth/Throat:     Mouth: Mucous membranes are moist.  Eyes:     General: Lids are normal.        Right eye: No discharge.        Left  eye: No discharge.     Conjunctiva/sclera: Conjunctivae normal.     Pupils: Pupils are equal, round, and reactive to light.  Neck:     Musculoskeletal: Normal range of motion and neck supple.     Thyroid: No thyromegaly.     Vascular: No carotid bruit.  Cardiovascular:     Rate and Rhythm: Normal rate and regular rhythm.     Heart sounds: Normal heart sounds. No murmur. No gallop.   Pulmonary:     Effort: Pulmonary effort is normal.     Breath sounds: Normal  breath sounds.  Abdominal:     General: Bowel sounds are normal.     Palpations: Abdomen is soft. There is no hepatomegaly or splenomegaly.  Musculoskeletal:     Right knee: She exhibits normal range of motion, no swelling, no ecchymosis, no laceration and no erythema. No tenderness found.     Left knee: She exhibits normal range of motion, no swelling, no laceration and no erythema. No tenderness found.     Right lower leg: No edema.     Left lower leg: No edema.     Comments: Mild crepitus and popping noted with ROM of both knees, full range of motion present without report pain.  Refer to below for shoulder exam.  Lymphadenopathy:     Cervical: No cervical adenopathy.  Skin:    General: Skin is warm and dry.  Neurological:     Mental Status: She is alert and oriented to person, place, and time.  Psychiatric:        Attention and Perception: Attention normal.        Mood and Affect: Mood normal.        Behavior: Behavior normal. Behavior is cooperative.        Thought Content: Thought content normal.        Judgment: Judgment normal.       Shoulder: bilateral    Inspection:  no swelling, ecchymosis, erythema or step off deformity.    Tenderness to Palpation:    Acromion: no    AC joint:no    Clavicle: no    Bicipital groove: no    Scapular spine: no    Coracoid process: no    Humeral head: no    Supraspinatus tendon: no     Range of Motion:  Full Range of Motion bilaterally    Abduction:Normal    Adduction: Normal    Flexion: Normal, although discomfort with this    Extension: Normal, although discomfort with this    Internal rotation: Normal    External rotation: Normal    Painful arc: no     Muscle Strength: 5/5 bilaterally    Flexion: Normal8    Extension: Normal    Abduction: Normal    Adduction: Normal    External rotation: Normal    Internal rotation: Normal     Neuro: Sensation WNL. and Upper extremity reflexes WNL.     Special Tests:     Neer sign:  Negative    Hawkins sign: Negative    Cross arm adduction: Negative    Empty Can Testing: positive right side, negative left with no weakness  Results for orders placed or performed during the hospital encounter of 07/13/18  QuantiFERON-TB Gold Plus  Result Value Ref Range   QuantiFERON Incubation Incubation performed.    QuantiFERON-TB Gold Plus Negative Negative  HIV Antibody (routine testing w rflx)  Result Value Ref Range   HIV Screen 4th Generation wRfx  Non Reactive Non Reactive  Histoplasma antibodies, Qn,DID  Result Value Ref Range   Histoplasma Ab, Immunodiffusion Negative Neg:<1:1  QuantiFERON-TB Gold Plus  Result Value Ref Range   QuantiFERON Criteria Comment    QuantiFERON TB1 Ag Value 0.06 IU/mL   QuantiFERON TB2 Ag Value 0.05 IU/mL   QuantiFERON Nil Value 0.07 IU/mL   QuantiFERON Mitogen Value >10.00 IU/mL      Assessment & Plan:   Problem List Items Addressed This Visit      Nervous and Auditory   CFS (chronic fatigue syndrome)     Other   Depression, major, single episode, severe (HCC) - Primary   Relevant Orders   Lithium level   Chronic pain of both knees    Ongoing for years, but worsening.  Will obtain imaging bilateral knees.  Labs ordered.  Script for Diclofenac gel sent.  Recommend continued use of Tylenol as needed + ice, which she reports some benefit from.  Dependent on labs and imaging, possible referral to ortho or PT.  Return in 4 weeks or sooner if worsening symptoms.      Relevant Orders   DG Knee Complete 4 Views Right   DG Knee Complete 4 Views Left   Chronic pain of both shoulders    Ongoing for years, but worsening.  Will obtain imaging bilaterally.  Labs ordered.  Script for Diclofenac gel sent.  Recommend continued use of Tylenol as needed + ice, which she reports some benefit from.  Dependent on labs and imaging, possible referral to ortho or PT.  Return in 4 weeks or sooner if worsening symptoms.      Relevant Orders   DG Shoulder  Right   DG Shoulder Left       Follow up plan: Return in about 4 weeks (around 12/22/2018) for Pain follow-up and mood.

## 2018-11-24 NOTE — Assessment & Plan Note (Signed)
Ongoing for years, but worsening.  Will obtain imaging bilaterally.  Labs ordered.  Script for Diclofenac gel sent.  Recommend continued use of Tylenol as needed + ice, which she reports some benefit from.  Dependent on labs and imaging, possible referral to ortho or PT.  Return in 4 weeks or sooner if worsening symptoms.

## 2018-11-24 NOTE — Assessment & Plan Note (Addendum)
Ongoing for years, but worsening.  Will obtain imaging bilateral knees.  Labs ordered.  Script for Diclofenac gel sent.  Recommend continued use of Tylenol as needed + ice, which she reports some benefit from.  Dependent on labs and imaging, possible referral to ortho or PT.  Return in 4 weeks or sooner if worsening symptoms.

## 2018-11-24 NOTE — Patient Instructions (Addendum)
[address] [place], [place] [address] [phone]   Acute Knee Pain, Adult Many things can cause knee pain. Sometimes, knee pain is sudden (acute) and may be caused by damage, swelling, or irritation of the muscles and tissues that support your knee. The pain often goes away on its own with time and rest. If the pain does not go away, tests may be done to find out what is causing the pain. Follow these instructions at home: Pay attention to any changes in your symptoms. Take these actions to relieve your pain. If you have a knee sleeve or brace:   Wear the sleeve or brace as told by your doctor. Remove it only as told by your doctor.  Loosen the sleeve or brace if your toes: ? Tingle. ? Become numb. ? Turn cold and blue.  Keep the sleeve or brace clean.  If the sleeve or brace is not waterproof: ? Do not let it get wet. ? Cover it with a watertight covering when you take a bath or shower. Activity  Rest your knee.  Do not do things that cause pain.  Avoid activities where both feet leave the ground at the same time (high-impact activities). Examples are running, jumping rope, and doing jumping jacks.  Work with a physical therapist to make a safe exercise program, as told by your doctor. Managing pain, stiffness, and swelling   If told, put ice on the knee: ? Put ice in a plastic bag. ? Place a towel between your skin and the bag. ? Leave the ice on for 20 minutes, 2-3 times a day.  If told, put pressure (compression) on your injured knee to control swelling, give support, and help with discomfort. Compression may be done with an elastic bandage. General instructions  Take all medicines only as told by your doctor.  Raise (elevate) your knee while you are sitting or lying down. Make sure your knee is higher than your heart.  Sleep with a pillow under your knee.  Do not use any products that contain nicotine or tobacco. These include cigarettes,  e-cigarettes, and chewing tobacco. These products may slow down healing. If you need help quitting, ask your doctor.  If you are overweight, work with your doctor and a food expert (dietitian) to set goals to lose weight. Being overweight can make your knee hurt more.  Keep all follow-up visits as told by your doctor. This is important. Contact a doctor if:  The knee pain does not stop.  The knee pain changes or gets worse.  You have a fever along with knee pain.  Your knee feels warm when you touch it.  Your knee gives out or locks up. Get help right away if:  Your knee swells, and the swelling gets worse.  You cannot move your knee.  You have very bad knee pain. Summary  Many things can cause knee pain. The pain often goes away on its own with time and rest.  Your doctor may do tests to find out the cause of the pain.  Pay attention to any changes in your symptoms. Relieve your pain with rest, medicines, light activity, and use of ice.  Get help right away if you cannot move your knee or your knee pain is very bad. This information is not intended to replace advice given to you by your health care provider. Make sure you discuss any questions you have with your health care provider. Document Released: 09/12/2008 Document Revised: 11/26/2017  Document Reviewed: 11/26/2017 Elsevier Interactive Patient Education  2019 [employer] Inc.  Rotator Cuff Tendinitis  Rotator cuff tendinitis is inflammation of the tough, cord-like bands that connect muscle to bone (tendons) in the rotator cuff. The rotator cuff includes all of the muscles and tendons that connect the arm to the shoulder. The rotator cuff holds the head of the upper arm bone (humerus) in the cup (fossa) of the shoulder blade (scapula). This condition can lead to a long-lasting (chronic) tear. The tear may be partial or complete. What are the causes? This condition is usually caused by overusing the rotator cuff. What  increases the risk? This condition is more likely to develop in athletes and workers who frequently use their shoulder or reach over their heads. This can include activities such as:  Tennis.  Baseball or softball.  Swimming.  Construction work.  Painting. What are the signs or symptoms? Symptoms of this condition include:  Pain spreading (radiating) from the shoulder to the upper arm.  Swelling and tenderness in front of the shoulder.  Pain when reaching, pulling, or lifting the arm above the head.  Pain when lowering the arm from above the head.  Minor pain in the shoulder when resting.  Increased pain in the shoulder at night.  Difficulty placing the arm behind the back. How is this diagnosed? This condition is diagnosed with a medical history and physical exam. Tests may also be done, including:  X-rays.  MRI.  Ultrasounds.  CT or MR arthrogram. During this test, a contrast material is injected and then images are taken. How is this treated? Treatment for this condition depends on the severity of the condition. In less severe cases, treatment may include:  Rest. This may be done with a sling that holds the shoulder still (immobilization). Your health care provider may also recommend avoiding activities that involve lifting your arm over your head.  Icing the shoulder.  Anti-inflammatory medicines, such as aspirin or ibuprofen. In more severe cases, treatment may include:  Physical therapy.  Steroid injections.  Surgery. Follow these instructions at home: If you have a sling:  Wear the sling as told by your health care provider. Remove it only as told by your health care provider.  Loosen the sling if your fingers tingle, become numb, or turn cold and blue.  Keep the sling clean.  If the sling is not waterproof, do not let it get wet. Remove it, if allowed, or cover it with a watertight covering when you take a bath or shower. Managing pain,  stiffness, and swelling  If directed, put ice on the injured area. ? If you have a removable sling, remove it as told by your health care provider. ? Put ice in a plastic bag. ? Place a towel between your skin and the bag. ? Leave the ice on for 20 minutes, 2-3 times a day.  Move your fingers often to avoid stiffness and to lessen swelling.  Raise (elevate) the injured area above the level of your heart while you are lying down.  Find a comfortable sleeping position or sleep on a recliner, if available. Driving  Do not drive or use heavy machinery while taking prescription pain medicine.  Ask your health care provider when it is safe to drive if you have a sling on your arm. Activity  Rest your shoulder as told by your health care provider.  Return to your normal activities as told by your health care provider. Ask your health care provider  what activities are safe for you.  Do any exercises or stretches as told by your health care provider.  If you do repetitive overhead tasks, take small breaks in between and include stretching exercises as told by your health care provider. General instructions  Do not use any products that contain nicotine or tobacco, such as cigarettes and e-cigarettes. These can delay healing. If you need help quitting, ask your health care provider.  Take over-the-counter and prescription medicines only as told by your health care provider.  Keep all follow-up visits as told by your health care provider. This is important. Contact a health care provider if:  Your pain gets worse.  You have new pain in your arm, hands, or fingers.  Your pain is not relieved with medicine or does not get better after 6 weeks of treatment.  You have cracking sensations when moving your shoulder in certain directions.  You hear a snapping sound after using your shoulder, followed by severe pain and weakness. Get help right away if:  Your arm, hand, or fingers are  numb or tingling.  Your arm, hand, or fingers are swollen or painful or they turn white or blue. Summary  Rotator cuff tendinitis is inflammation of the tough, cord-like bands that connect muscle to bone (tendons) in the rotator cuff.  This condition is usually caused by overusing the rotator cuff, which includes all of the muscles and tendons that connect the arm to the shoulder.  This condition is more likely to develop in athletes and workers who frequently use their shoulder or reach over their heads.  Treatment generally includes rest, anti-inflammatory medicines, and icing. In some cases, physical therapy and steroid injections may be needed. In severe cases, surgery may be needed. This information is not intended to replace advice given to you by your health care provider. Make sure you discuss any questions you have with your health care provider. Document Released: 09/06/2003 Document Revised: 06/02/2016 Document Reviewed: 06/02/2016 Elsevier Interactive Patient Education  2019 [employer]

## 2018-11-25 LAB — ANA W/REFLEX IF POSITIVE: Anti Nuclear Antibody (ANA): NEGATIVE

## 2018-11-25 LAB — C-REACTIVE PROTEIN: CRP: 1 mg/L (ref 0–10)

## 2018-11-25 LAB — COMPREHENSIVE METABOLIC PANEL
ALT: 13 IU/L (ref 0–32)
AST: 17 IU/L (ref 0–40)
Albumin/Globulin Ratio: 2 (ref 1.2–2.2)
Albumin: 4.3 g/dL (ref 3.8–4.8)
Alkaline Phosphatase: 46 IU/L (ref 39–117)
BUN/Creatinine Ratio: 13 (ref 9–23)
BUN: 11 mg/dL (ref 6–24)
Bilirubin Total: 0.3 mg/dL (ref 0.0–1.2)
CO2: 26 mmol/L (ref 20–29)
Calcium: 9.5 mg/dL (ref 8.7–10.2)
Chloride: 102 mmol/L (ref 96–106)
Creatinine, Ser: 0.85 mg/dL (ref 0.57–1.00)
GFR calc Af Amer: 96 mL/min/{1.73_m2} (ref >59)
GFR calc non Af Amer: 84 mL/min/{1.73_m2} (ref >59)
Globulin, Total: 2.1 g/dL (ref 1.5–4.5)
Glucose: 91 mg/dL (ref 65–99)
Potassium: 4 mmol/L (ref 3.5–5.2)
Sodium: 140 mmol/L (ref 134–144)
Total Protein: 6.4 g/dL (ref 6.0–8.5)

## 2018-11-25 LAB — CBC WITH DIFFERENTIAL/PLATELET
Basophils Absolute: 0 10*3/uL (ref 0.0–0.2)
Basos: 0 %
EOS (ABSOLUTE): 0.2 10*3/uL (ref 0.0–0.4)
Eos: 2 %
Hematocrit: 37.7 % (ref 34.0–46.6)
Hemoglobin: 13.3 g/dL (ref 11.1–15.9)
Immature Grans (Abs): 0 10*3/uL (ref 0.0–0.1)
Immature Granulocytes: 0 %
Lymphocytes Absolute: 2.1 10*3/uL (ref 0.7–3.1)
Lymphs: 30 %
MCH: 30 pg (ref 26.6–33.0)
MCHC: 35.3 g/dL (ref 31.5–35.7)
MCV: 85 fL (ref 79–97)
Monocytes Absolute: 0.4 10*3/uL (ref 0.1–0.9)
Monocytes: 6 %
Neutrophils Absolute: 4.4 10*3/uL (ref 1.4–7.0)
Neutrophils: 62 %
Platelets: 338 10*3/uL (ref 150–450)
RBC: 4.43 x10E6/uL (ref 3.77–5.28)
RDW: 12.4 % (ref 11.7–15.4)
WBC: 7.1 10*3/uL (ref 3.4–10.8)

## 2018-11-25 LAB — SEDIMENTATION RATE: Sed Rate: 4 mm/hr (ref 0–32)

## 2018-11-25 LAB — THYROID PANEL WITH TSH
Free Thyroxine Index: 2.3 (ref 1.2–4.9)
T3 Uptake Ratio: 27 % (ref 24–39)
T4, Total: 8.5 ug/dL (ref 4.5–12.0)
TSH: 0.894 u[IU]/mL (ref 0.450–4.500)

## 2018-11-25 LAB — RHEUMATOID FACTOR: Rheumatoid fact SerPl-aCnc: <10 IU/mL (ref 0.0–13.9)

## 2018-11-25 LAB — URIC ACID: Uric Acid: 3.3 mg/dL (ref 2.5–7.1)

## 2018-11-25 LAB — LITHIUM LEVEL: Lithium Lvl: 0.2 mmol/L — ABNORMAL LOW (ref 0.6–1.2)

## 2018-11-25 LAB — VITAMIN D 25 HYDROXY (VIT D DEFICIENCY, FRACTURES): Vit D, 25-Hydroxy: 22.8 ng/mL — ABNORMAL LOW (ref 30.0–100.0)

## 2018-12-06 ENCOUNTER — Other Ambulatory Visit: Payer: Self-pay | Admitting: Nurse Practitioner

## 2018-12-23 ENCOUNTER — Other Ambulatory Visit: Payer: Self-pay

## 2018-12-23 MED ORDER — AMPHETAMINE-DEXTROAMPHETAMINE 30 MG PO TABS: 30.0000 mg | ORAL_TABLET | Freq: Two times a day (BID) | ORAL | 0 refills | Status: DC

## 2018-12-26 ENCOUNTER — Other Ambulatory Visit: Payer: Self-pay | Admitting: Unknown Physician Specialty

## 2018-12-26 NOTE — Telephone Encounter (Signed)
Requested medication (s) are due for refill today: yes  Requested medication (s) are on the active medication list: yes  Last refill:  05/07/18  Future visit scheduled: yes  Notes to clinic:  Medication not delegated to NT to refill   Requested Prescriptions  Pending Prescriptions Disp Refills   lithium 300 MG tablet [Pharmacy Med Name: Lithium Carbonate 300 MG Oral Tablet] 60 tablet 0    Sig: Take 1 tablet by mouth twice daily     Not Delegated - Psychiatry:  Antimanics/Bipolar Agents Failed - 12/26/2018 11:09 AM      Failed - This refill cannot be delegated      Failed - Lithium Level in normal range and within 180 days    Lithium Lvl  Date Value Ref Range Status  11/24/2018 0.2 (L) 0.6 - 1.2 mmol/L Final    Comment:                                     Detection Limit = 0.1                           <0.1 indicates None Detected          Passed - Cr in normal range and within 180 days    Creatinine, Ser  Date Value Ref Range Status  11/24/2018 0.85 0.57 - 1.00 mg/dL Final         Passed - TSH in normal range and within 180 days    TSH  Date Value Ref Range Status  11/24/2018 0.894 0.450 - 4.500 uIU/mL Final         Passed - Valid encounter within last 6 months    Recent Outpatient Visits          1 month ago Chronic pain of both knees   [place], Jellico T, NP   1 month ago Depression, major, single episode, severe ([place])   [place], [name], NP   4 months ago Depression, major, single episode, severe ([place])   [place], [name], NP   7 months ago Depression, major, single episode, severe ([place])   [place] Kathrine Haddock, NP   9 months ago Depression, major, single episode, severe [facility])   [place], Lilia Argue, PA-C      Future Appointments            In 2 days [name], Barbaraann Faster, NP [employer]

## 2018-12-28 ENCOUNTER — Encounter: Payer: Self-pay | Admitting: Nurse Practitioner

## 2018-12-28 ENCOUNTER — Other Ambulatory Visit: Payer: Self-pay

## 2018-12-28 ENCOUNTER — Ambulatory Visit (INDEPENDENT_AMBULATORY_CARE_PROVIDER_SITE_OTHER): Payer: No Typology Code available for payment source | Admitting: Nurse Practitioner

## 2018-12-28 VITALS — BP 117/75 | HR 72 | Temp 98.8°F

## 2018-12-28 DIAGNOSIS — F322 Major depressive disorder, single episode, severe without psychotic features: Secondary | ICD-10-CM

## 2018-12-28 DIAGNOSIS — M25561 Pain in right knee: Secondary | ICD-10-CM | POA: Diagnosis not present

## 2018-12-28 DIAGNOSIS — G8929 Other chronic pain: Secondary | ICD-10-CM

## 2018-12-28 DIAGNOSIS — M25512 Pain in left shoulder: Secondary | ICD-10-CM

## 2018-12-28 DIAGNOSIS — M25511 Pain in right shoulder: Secondary | ICD-10-CM | POA: Diagnosis not present

## 2018-12-28 DIAGNOSIS — R918 Other nonspecific abnormal finding of lung field: Secondary | ICD-10-CM

## 2018-12-28 DIAGNOSIS — M25562 Pain in left knee: Secondary | ICD-10-CM

## 2018-12-28 NOTE — Assessment & Plan Note (Signed)
Improvement.  Continue Diclofenac gel and Tylenol as needed + gentle stretches daily.  Obtain imaging, this is already ordered and recommended patient obtain.

## 2018-12-28 NOTE — Assessment & Plan Note (Signed)
Low dose CT scan ordered and highly recommended that patient schedule follow-up with Dr. Alva Garnet to review CT scan results and discuss plan of care.

## 2018-12-28 NOTE — Progress Notes (Signed)
BP 117/75   Pulse 72   Temp 98.8 F (37.1 C) (Oral)   SpO2 99%    Subjective:    Patient ID: Courtney Davidson, female    DOB: 1976/01/02, 43 y.o.   MRN: [id number]  HPI: Courtney STUEVE is a 43 y.o. female  Chief Complaint  Patient presents with  . Pain  . Depression   KNEE & SHOULDER PAIN: Reports these have improved with Diclofenac gel, using every day (once a day).  Continues to take Tylenol daily.  Has not gone for imaging as of yet.  At worst is a 5/10 and at best 0/10.  States she has been stretching gently daily.  Overall reports improvement with pain.  DEPRESSION Mood status: controlled Satisfied with current treatment?: yes Symptom severity: moderate  Duration of current treatment : chronic Side effects: no Medication compliance: good compliance Psychotherapy/counseling: none Depressed mood: yes Anxious mood: yes Anhedonia: no Significant weight loss or gain: no Insomnia: yes hard to fall asleep Fatigue: no Feelings of worthlessness or guilt: no Impaired concentration/indecisiveness: no Suicidal ideations: no Hopelessness: no Crying spells: no Depression screen [facility] 2/9 12/28/2018 11/16/2018 08/17/2018 08/17/2018 08/17/2018  Decreased Interest 1 0 _0 Down, Depressed, Hopeless 1 0 0 0 0  PHQ - 2 Score 2 0 _1 Altered sleeping 1 2 - 0 -  Tired, decreased energy 3 2 - 1 -  Change in appetite 0 0 - 0 -  Feeling bad or failure about yourself  0 0 - 0 -  Trouble concentrating 0 0 - 0 -  Moving slowly or fidgety/restless 0 0 - 0 -  Suicidal thoughts 0 0 - 0 -  PHQ-9 Score 6 4 - 4 -  Difficult doing work/chores Not difficult at all Somewhat difficult - Not difficult at all -  Some recent data might be hidden   GAD 7 : Generalized Anxiety Score 12/28/2018 08/17/2018 03/05/2018 12/25/2017  Nervous, Anxious, on Edge 0 _2 Control/stop worrying _3 Worry too much - different things _4 Trouble relaxing 1 0 1 1  Restless 1 0 0 2  Easily annoyed  or irritable _5 Afraid - awful might happen 0 0 0 0  Total GAD 7 Score _6 Anxiety Difficulty Not difficult at all Somewhat difficult - -   LUNG NODULES: First noted in 2017 on CTAP, LLL nodule with subsequent CT scans of chest noting increasing number of nodules being present.  (2018 and 2019).  She is followed by Dr. Alva Garnet with pulmonary and was last seen 08/27/18.  On reivew of note he wanted her to have repeat CT scan done in May this year, she was also supposed to return to see him in 6-8 weeks from her February appointment.  She endorses she has not followed up with Dr. Alva Garnet and has not called his office to inquire about CT scan order.  Discussed with her, will order repeat CT scan today.  Her December 2019 CT noted "There is interval development of multiple nodules throughout both lungs, the largest being 7 mm spiculated nodule in right upper lobe. This is highly concerning for metastatic disease".  She does endorse she continues to smoke 1/2 PPD.  Denies cough, hemoptysis, SOB.  Relevant past medical, surgical, family and social history reviewed and updated as indicated. Interim medical history since our last visit reviewed. Allergies and medications  reviewed and updated.  Review of Systems  Constitutional: Negative for activity change, appetite change, diaphoresis, fatigue and fever.  Respiratory: Negative for cough, chest tightness, shortness of breath and wheezing.   Cardiovascular: Negative for chest pain, palpitations and leg swelling.  Gastrointestinal: Negative for abdominal distention, abdominal pain, constipation, diarrhea, nausea and vomiting.  Musculoskeletal: Negative.   Neurological: Negative for dizziness, syncope, weakness, light-headedness, numbness and headaches.  Psychiatric/Behavioral: Positive for sleep disturbance. Negative for decreased concentration, self-injury and suicidal ideas. The patient is not nervous/anxious.     Per HPI unless  specifically indicated above     Objective:    BP 117/75   Pulse 72   Temp 98.8 F (37.1 C) (Oral)   SpO2 99%   Wt Readings from Last 3 Encounters:  11/16/18 150 lb (68 kg)  08/27/18 150 lb 12.8 oz (68.4 kg)  08/17/18 150 lb 2 oz (68.1 kg)    Physical Exam Vitals signs and nursing note reviewed.  Constitutional:      General: She is awake. She is not in acute distress.    Appearance: She is well-developed. She is not ill-appearing.  HENT:     Head: Normocephalic.     Right Ear: Hearing normal.     Left Ear: Hearing normal.     Nose: Nose normal.     Mouth/Throat:     Mouth: Mucous membranes are moist.  Eyes:     General: Lids are normal.        Right eye: No discharge.        Left eye: No discharge.     Conjunctiva/sclera: Conjunctivae normal.     Pupils: Pupils are equal, round, and reactive to light.  Neck:     Musculoskeletal: Normal range of motion and neck supple.     Thyroid: No thyromegaly.     Vascular: No carotid bruit or JVD.  Cardiovascular:     Rate and Rhythm: Normal rate and regular rhythm.     Heart sounds: Normal heart sounds. No murmur. No gallop.   Pulmonary:     Effort: Pulmonary effort is normal. No accessory muscle usage or respiratory distress.     Breath sounds: Normal breath sounds.  Abdominal:     General: Bowel sounds are normal.     Palpations: Abdomen is soft. There is no hepatomegaly or splenomegaly.  Musculoskeletal:     Right lower leg: No edema.     Left lower leg: No edema.  Lymphadenopathy:     Cervical: No cervical adenopathy.  Skin:    General: Skin is warm and dry.  Neurological:     Mental Status: She is alert and oriented to person, place, and time.  Psychiatric:        Attention and Perception: Attention normal.        Mood and Affect: Mood normal.        Behavior: Behavior normal. Behavior is cooperative.        Thought Content: Thought content normal.        Judgment: Judgment normal.     Results for orders  placed or performed in visit on 11/24/18  Uric acid  Result Value Ref Range   Uric Acid 3.3 2.5 - 7.1 mg/dL  VITAMIN D 25 Hydroxy (Vit-D Deficiency, Fractures)  Result Value Ref Range   Vit D, 25-Hydroxy 22.8 (L) 30.0 - 100.0 ng/mL  Comprehensive metabolic panel  Result Value Ref Range   Glucose 91 65 - 99 mg/dL   BUN 11  6 - 24 mg/dL   Creatinine, Ser 0.85 0.57 - 1.00 mg/dL   GFR calc non Af Amer 84 >59 mL/min/1.73   GFR calc Af Amer 96 >59 mL/min/1.73   BUN/Creatinine Ratio 13 9 - 23   Sodium 140 134 - 144 mmol/L   Potassium 4.0 3.5 - 5.2 mmol/L   Chloride 102 96 - 106 mmol/L   CO2 26 20 - 29 mmol/L   Calcium 9.5 8.7 - 10.2 mg/dL   Total Protein 6.4 6.0 - 8.5 g/dL   Albumin 4.3 3.8 - 4.8 g/dL   Globulin, Total 2.1 1.5 - 4.5 g/dL   Albumin/Globulin Ratio 2.0 1.2 - 2.2   Bilirubin Total 0.3 0.0 - 1.2 mg/dL   Alkaline Phosphatase 46 39 - 117 IU/L   AST 17 0 - 40 IU/L   ALT 13 0 - 32 IU/L  CBC with Differential/Platelet  Result Value Ref Range   WBC 7.1 3.4 - 10.8 x10E3/uL   RBC 4.43 3.77 - 5.28 x10E6/uL   Hemoglobin 13.3 11.1 - 15.9 g/dL   Hematocrit 37.7 34.0 - 46.6 %   MCV 85 79 - 97 fL   MCH 30.0 26.6 - 33.0 pg   MCHC 35.3 31.5 - 35.7 g/dL   RDW 12.4 11.7 - 15.4 %   Platelets 338 150 - 450 x10E3/uL   Neutrophils 62 Not Estab. %   Lymphs 30 Not Estab. %   Monocytes 6 Not Estab. %   Eos 2 Not Estab. %   Basos 0 Not Estab. %   Neutrophils Absolute 4.4 1.4 - 7.0 x10E3/uL   Lymphocytes Absolute 2.1 0.7 - 3.1 x10E3/uL   Monocytes Absolute 0.4 0.1 - 0.9 x10E3/uL   EOS (ABSOLUTE) 0.2 0.0 - 0.4 x10E3/uL   Basophils Absolute 0.0 0.0 - 0.2 x10E3/uL   Immature Granulocytes 0 Not Estab. %   Immature Grans (Abs) 0.0 0.0 - 0.1 x10E3/uL  Thyroid Panel With TSH  Result Value Ref Range   TSH 0.894 0.450 - 4.500 uIU/mL   T4, Total 8.5 4.5 - 12.0 ug/dL   T3 Uptake Ratio 27 24 - 39 %   Free Thyroxine Index 2.3 1.2 - 4.9  C-reactive protein  Result Value Ref Range   CRP 1 0 - 10  mg/L  Sed Rate (ESR)  Result Value Ref Range   Sed Rate 4 0 - 32 mm/hr  ANA w/Reflex if Positive  Result Value Ref Range   Anti Nuclear Antibody (ANA) Negative Negative  Rheumatoid factor  Result Value Ref Range   Rhuematoid fact SerPl-aCnc <10.0 0.0 - 13.9 IU/mL  Lithium level  Result Value Ref Range   Lithium Lvl 0.2 (L) 0.6 - 1.2 mmol/L      Assessment & Plan:   Problem List Items Addressed This Visit      Other   Depression, major, single episode, severe (HCC)    Chronic, ongoing.  Continue current medication regimen, Prozac, and adjust as needed.        Pulmonary nodules - Primary    Low dose CT scan ordered and highly recommended that patient schedule follow-up with Dr. Alva Garnet to review CT scan results and discuss plan of care.      Relevant Orders   CT CHEST LUNG CA SCREEN LOW DOSE W/O CM   Chronic pain of both knees    Reports improvement.  Continue Diclofenac gel and Tylenol as needed + gentle stretches daily.  Obtain imaging, this is already ordered and recommended patient obtain.  Chronic pain of both shoulders    Improvement.  Continue Diclofenac gel and Tylenol as needed + gentle stretches daily.  Obtain imaging, this is already ordered and recommended patient obtain.          Follow up plan: Return in about 3 months (around 03/30/2019) for Depression.

## 2018-12-28 NOTE — Assessment & Plan Note (Signed)
Chronic, ongoing.  Continue current medication regimen, Prozac, and adjust as needed.

## 2018-12-28 NOTE — Patient Instructions (Addendum)
IMAGING LOCATION: [address], [place], [place] [address] [phone]   Acute Knee Pain, Adult Many things can cause knee pain. Sometimes, knee pain is sudden (acute) and may be caused by damage, swelling, or irritation of the muscles and tissues that support your knee. The pain often goes away on its own with time and rest. If the pain does not go away, tests may be done to find out what is causing the pain. Follow these instructions at home: Pay attention to any changes in your symptoms. Take these actions to relieve your pain. If you have a knee sleeve or brace:   Wear the sleeve or brace as told by your doctor. Remove it only as told by your doctor.  Loosen the sleeve or brace if your toes: ? Tingle. ? Become numb. ? Turn cold and blue.  Keep the sleeve or brace clean.  If the sleeve or brace is not waterproof: ? Do not let it get wet. ? Cover it with a watertight covering when you take a bath or shower. Activity  Rest your knee.  Do not do things that cause pain.  Avoid activities where both feet leave the ground at the same time (high-impact activities). Examples are running, jumping rope, and doing jumping jacks.  Work with a physical therapist to make a safe exercise program, as told by your doctor. Managing pain, stiffness, and swelling   If told, put ice on the knee: ? Put ice in a plastic bag. ? Place a towel between your skin and the bag. ? Leave the ice on for 20 minutes, 2-3 times a day.  If told, put pressure (compression) on your injured knee to control swelling, give support, and help with discomfort. Compression may be done with an elastic bandage. General instructions  Take all medicines only as told by your doctor.  Raise (elevate) your knee while you are sitting or lying down. Make sure your knee is higher than your heart.  Sleep with a pillow under your knee.  Do not use any products that contain nicotine or tobacco. These include  cigarettes, e-cigarettes, and chewing tobacco. These products may slow down healing. If you need help quitting, ask your doctor.  If you are overweight, work with your doctor and a food expert (dietitian) to set goals to lose weight. Being overweight can make your knee hurt more.  Keep all follow-up visits as told by your doctor. This is important. Contact a doctor if:  The knee pain does not stop.  The knee pain changes or gets worse.  You have a fever along with knee pain.  Your knee feels warm when you touch it.  Your knee gives out or locks up. Get help right away if:  Your knee swells, and the swelling gets worse.  You cannot move your knee.  You have very bad knee pain. Summary  Many things can cause knee pain. The pain often goes away on its own with time and rest.  Your doctor may do tests to find out the cause of the pain.  Pay attention to any changes in your symptoms. Relieve your pain with rest, medicines, light activity, and use of ice.  Get help right away if you cannot move your knee or your knee pain is very bad. This information is not intended to replace advice given to you by your health care provider. Make sure you discuss any questions you have with your health care provider. Document Released: 09/12/2008 Document   Revised: 11/26/2017 Document Reviewed: 11/26/2017 Elsevier Patient Education  2020 [employer] Inc.  

## 2018-12-28 NOTE — Assessment & Plan Note (Signed)
Reports improvement.  Continue Diclofenac gel and Tylenol as needed + gentle stretches daily.  Obtain imaging, this is already ordered and recommended patient obtain.

## 2018-12-30 ENCOUNTER — Other Ambulatory Visit: Payer: Self-pay | Admitting: *Deleted

## 2018-12-30 DIAGNOSIS — R911 Solitary pulmonary nodule: Secondary | ICD-10-CM

## 2019-01-26 ENCOUNTER — Other Ambulatory Visit: Payer: Self-pay

## 2019-01-26 MED ORDER — AMPHETAMINE-DEXTROAMPHETAMINE 30 MG PO TABS: 30.0000 mg | ORAL_TABLET | Freq: Two times a day (BID) | ORAL | 0 refills | Status: DC

## 2019-01-26 NOTE — Telephone Encounter (Signed)
Routing to provider  

## 2019-02-25 ENCOUNTER — Other Ambulatory Visit: Payer: Self-pay

## 2019-02-25 MED ORDER — AMPHETAMINE-DEXTROAMPHETAMINE 30 MG PO TABS: 30.0000 mg | ORAL_TABLET | Freq: Two times a day (BID) | ORAL | 0 refills | Status: DC

## 2019-03-28 ENCOUNTER — Other Ambulatory Visit: Payer: Self-pay

## 2019-03-28 MED ORDER — AMPHETAMINE-DEXTROAMPHETAMINE 30 MG PO TABS: 30.0000 mg | ORAL_TABLET | Freq: Two times a day (BID) | ORAL | 0 refills | Status: DC

## 2019-03-28 NOTE — Telephone Encounter (Signed)
Routing to provider  

## 2019-03-30 ENCOUNTER — Ambulatory Visit: Payer: No Typology Code available for payment source | Admitting: Nurse Practitioner

## 2019-04-29 ENCOUNTER — Other Ambulatory Visit: Payer: Self-pay

## 2019-04-29 MED ORDER — AMPHETAMINE-DEXTROAMPHETAMINE 30 MG PO TABS: 30.0000 mg | ORAL_TABLET | Freq: Two times a day (BID) | ORAL | 0 refills | Status: DC

## 2019-04-29 NOTE — Telephone Encounter (Signed)
Routing to provider  

## 2019-04-29 NOTE — Telephone Encounter (Signed)
Tried calling patient to schedule a follow up. No answer. Unable to leave a message. VM not set up. Will

## 2019-04-29 NOTE — Telephone Encounter (Signed)
Will refill once, but patient needs to schedule follow-up in upcoming month.  Please alert her to this.  She cancelled recent follow-up.

## 2019-05-02 NOTE — Telephone Encounter (Signed)
Called patient. Unable to leave a message. VM box not set up.

## 2019-05-03 NOTE — Telephone Encounter (Signed)
Tried to call patient. Unable to leave a message. VM not set up.

## 2019-05-04 NOTE — Telephone Encounter (Signed)
Called patient. Unable to leave a message. Box not set up

## 2019-05-23 NOTE — Telephone Encounter (Signed)
Letter generated and sent to patient for farther refill. 

## 2019-05-27 ENCOUNTER — Other Ambulatory Visit: Payer: Self-pay

## 2019-05-30 MED ORDER — AMPHETAMINE-DEXTROAMPHETAMINE 30 MG PO TABS: 30.0000 mg | ORAL_TABLET | Freq: Two times a day (BID) | ORAL | 0 refills | Status: DC

## 2019-05-30 NOTE — Telephone Encounter (Signed)
Will send in this refill but please alert patient that for further refills she needs to be seen in office for follow-up.  Thank you.

## 2019-05-30 NOTE — Telephone Encounter (Signed)
Letter was sent to the patient last week to please call and schedule follow up before more refills.

## 2019-06-19 ENCOUNTER — Other Ambulatory Visit: Payer: Self-pay | Admitting: Nurse Practitioner

## 2019-06-19 DIAGNOSIS — F322 Major depressive disorder, single episode, severe without psychotic features: Secondary | ICD-10-CM

## 2019-06-20 ENCOUNTER — Telehealth: Payer: Self-pay | Admitting: Nurse Practitioner

## 2019-06-20 NOTE — Chronic Care Management (AMB) (Signed)
  Care Management   Outreach Note  06/20/2019 Name: Courtney Davidson MRN: [id number] DOB: 11-Aug-1975  Referred by: Venita Lick, NP Reason for referral :  Care Management (CM Initial outreach unsuccessful)   An unsuccessful telephone outreach was attempted today. The patient was referred to the case management team by for assistance with care management and care coordination.   Follow Up Plan: The care management team will reach out to the patient again over the next 7 days.   Glenna Durand, LPN Health Advisor, Embedded Care Coordination  Care Management ??[email or web address] ??[phone]

## 2019-06-27 ENCOUNTER — Other Ambulatory Visit: Payer: Self-pay

## 2019-06-27 MED ORDER — AMPHETAMINE-DEXTROAMPHETAMINE 30 MG PO TABS: 30.0000 mg | ORAL_TABLET | Freq: Two times a day (BID) | ORAL | 0 refills | Status: DC

## 2019-06-27 NOTE — Chronic Care Management (AMB) (Signed)
  Care Management   Outreach Note  06/27/2019 Name: SARAJANE FAMBROUGH MRN: [id number] DOB: 1975-08-13  Referred by: Venita Lick, NP Reason for referral :  Care Management (CM Initial outreach unsuccessful) and Care Management (2nd CM outreach attempt unsuccessful)   A second unsuccessful telephone outreach was attempted today. The patient was referred to the case management team for assistance with care management and care coordination.   Follow Up Plan: The care management team will reach out to the patient again over the next 7 days.   Glenna Durand, LPN Health Advisor, Embedded Care Coordination [place] Care Management ??[email or web address] ??[phone]

## 2019-06-27 NOTE — Chronic Care Management (AMB) (Signed)
  Care Management   Note  06/27/2019 Name: ANESIA BLACKWELL MRN: [id number] DOB: 20-Nov-1975  Narda Amber is a 43 y.o. year old female who is a primary care patient of [name], Barbaraann Faster, NP. I reached out to Narda Amber by phone today in response to a referral sent by Ms. Nils Flack Artist's health plan.    Ms. Tunison was given information about care management services today including:  1. Care management services include personalized support from designated clinical staff supervised by her physician, including individualized plan of care and coordination with other care providers 2. 24/7 contact phone numbers for assistance for urgent and routine care needs. 3. The patient may stop care management services at any time by phone call to the office staff.  Patient agreed to services and verbal consent obtained.   Follow up plan: Telephone appointment with CCM team member scheduled for:07/27/2019  Glenna Durand, [place] [place] Management ??[email or web address] ??[phone]

## 2019-06-27 NOTE — Telephone Encounter (Signed)
Routing to provider  

## 2019-07-08 ENCOUNTER — Other Ambulatory Visit: Payer: Self-pay | Admitting: Oncology

## 2019-07-08 ENCOUNTER — Telehealth: Payer: Self-pay | Admitting: *Deleted

## 2019-07-08 DIAGNOSIS — R918 Other nonspecific abnormal finding of lung field: Secondary | ICD-10-CM

## 2019-07-08 NOTE — Progress Notes (Signed)
  Pulmonary Nodule Clinic Telephone Note  Received referral from Aura Dials, NP.   Patient presented back in August 2018 revealing several stable pulmonary nodules.  Repeat chest CT on 06/02/2018 revealed interval development of multiple nodules throughout the both lungs with a largest being 7 mm spiculated nodule in right upper lobe.  This was highly concerning for metastatic disease.  She was referred to Dr. Merlene Pulling for recommendation and management.  Had PET scan completed on 06/10/2018 which redemonstrated multiple small pulmonary nodules many of which were hyper metabolic.  There was no evidence of hypermetabolic primary malignancy.  Given lack of primary malignancy, nodules were likely infectious/inflammatory.  It was recommended she have a repeat chest CT in 2 to 3 months, biopsy or pulmonary referral.  Patient was referred to Dr. Sung Amabile and was lost to follow-up.  She was evaluated by Dr. Sung Amabile on 08/27/2018 and had pulmonary function test performed and was started on Chantix to help her quit smoking.  I personally reviewed all of her previous imaging including a CT chest on 02/03/2017, 06/02/2018 and PET scan on 06/10/2018.   I recommend follow-up with noncontrasted CT scan ASAP.  Patient is a current everyday smoker.   High risk factors include: History of heavy smoking, exposure to asbestos, radium or uranium, personal family history of lung cancer, older age, sex (females greater than males), race (black and native [place] greater than weight), marginal speculation, upper lobe location, multiplicity (less than 5 nodules increases risk for malignancy) and emphysema and/or pulmonary fibrosis.   This recommendation follows the consensus statement: Guidelines for Management of Incidental Pulmonary Nodules Detected on CT Images: From the Fleischner Society 2017; Radiology 2017; 284:228-243.    I have placed order for CT scan without contrast to be completed in the next week or so.    Order placed for repeat CT chest without contrast. Scheduling message sent to have CT chest scheduled. [facility] will reach out to patient regarding scheduling. I will see patient back after the CT is chest virtually.  Durenda Hurt, NP 07/08/2019 12:13 PM

## 2019-07-08 NOTE — Telephone Encounter (Signed)
Pt made aware of referral to Lung Nodule Clinic from PCP. Pt is in agreement to have upcoming CT scan and follow up as recommended. Pt stated that she currently does not have insurance or employment to pay for CT scan. Spoke with Glenna Fellows who stated that we can help pt pay for scan with charitable funds. Pt informed that when receives bill to send it to Shawn to submit for payment through charitable funds.   Pt has been made aware of upcoming appts for follow up CT scan and follow up appt with Durenda Hurt, NP in the Lung Nodule Clinic. Pt verbalized understanding. Nothing further needed at this time.

## 2019-07-12 ENCOUNTER — Ambulatory Visit
Admission: RE | Admit: 2019-07-12 | Discharge: 2019-07-12 | Disposition: A | Discharge status: Home / Self Care | Payer: Self-pay | Source: Ambulatory Visit | Attending: Oncology | Admitting: Oncology

## 2019-07-12 ENCOUNTER — Other Ambulatory Visit: Payer: Self-pay

## 2019-07-12 DIAGNOSIS — R918 Other nonspecific abnormal finding of lung field: Secondary | ICD-10-CM | POA: Insufficient documentation

## 2019-07-13 ENCOUNTER — Inpatient Hospital Stay: Payer: Self-pay | Attending: Oncology | Admitting: Oncology

## 2019-07-13 DIAGNOSIS — R911 Solitary pulmonary nodule: Secondary | ICD-10-CM

## 2019-07-13 NOTE — Progress Notes (Signed)
Pulmonary Nodule Clinic Consult note [facility]  Telephone:([phone] [phone]  Patient Care Team: Marjie Skiff, NP as PCP - General (Nurse Practitioner) Glory Buff, RN as Registered Nurse Lourena Simmonds, [facility] (Pharmacist) Minor, Theadora Rama, RN as [facility] Management   Name of the patient: Courtney Davidson  [id number]  Apr 09, 1976   Date of visit: 07/13/2019   Diagnosis- Lung Nodule  Virtual Visit via Telephone Note   I connected with Courtney Davidson on 07/13/19 at @ 1pm by telephone visit and verified that I am speaking with the correct person using two identifiers.   I discussed the limitations, risks, security and privacy concerns of performing an evaluation and management service by telemedicine and the availability of in-person appointments. I also discussed with the patient that there may be a patient responsible charge related to this service. The patient expressed understanding and agreed to proceed.   Other persons participating in the visit and their role in the encounter:   Patient's location: Home  Provider's location: Office  Chief complaint/ Reason for visit- Pulmonary Nodule Clinic Initial Visit  Past Medical History:  Patient is referred from PCP Jimmie Molly, NP.  Patient presented back in August 2018 with stable pulmonary nodules.  Repeat chest CT on 06/02/2018 revealed interval development of multiple nodules throughout the both lungs with the largest being 7 mm spiculated nodule in right upper lobe.  This was highly concerning for metastatic disease.  She was referred to Dr. Merlene Pulling for recommendation and management.  Had PET scan completed on 06/10/2018 which demonstrated multiple small pulmonary nodules many of which were hypermetabolic.  There is no evidence of hypermetabolic primary malignancy.  Given lack of primary malignancy, nodules were likely infectious/inflammatory.  It was recommended she  have a repeat chest CT in 2 to 3 months, biopsy or pulmonary referral.  Patient was referred to Dr. Sung Amabile and was lost to oncology ollow-up.  She was evaluated by Dr. Sung Amabile on 08/27/2018 and had pulmonary function test performed.  She was started on Chantix to help her quit smoking.  Patient states Dr. Sung Amabile was "not concerned about pulmonary nodules but did recommend follow-up in 2 to 3 months".   I personally reviewed all of her previous imaging including CT chest on 02/03/2017, 06/02/2018 and PET scan on 06/10/2018.  It was recommended she have a follow-up with noncontrasted chest CT scan as soon as possible.  Interval history-patient presents to pulmonary nodule clinic to review latest CT chest which was performed yesterday 07/12/2019.  Per medical chart review, she did not follow-up with Dr. Merlene Pulling or Dr. Sung Amabile after their initial visits in early 2020.  She has been evaluated by her PCP on several occasions for chronic care management.  Today, she has been doing well.  She is very nervous for her results today.  Over the past 2 weeks to 1 month she has had intermittent shortness of breath with mild congestion.  She denies any sputum production or fever.  She currently does not smoke.  She does vape.  She quit smoking approximately 1 year ago.  She smoked 1 to 2 packs of cigarettes daily.  She currently is not employed.  She is disabled.  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   We spoke at length about textile occupational exposure such as  dyes and solvents, many of them being carcinogenic, are being used worldwide in the [employer]. The textile  industry workers are therefore, in continuous exposure to these dyes, solvents, fibre dusts and various other toxic chemicals.  She denies any known exposures to cause cancer and notes face and eye protection.  Patient has past family history positive for cancer.  Her mother died of  small cell lung cancer and her dad died of prostate cancer.  She does not have personal history of cancer.  She has been diagnosed with mild COPD/emphysema.  She is currently not taking any medications for this.  Her pmh is positive for: Past Medical History:  Diagnosis Date  . Anxiety   . Carpal tunnel syndrome   . CFS (chronic fatigue syndrome)   . Depression   . Fatigue   . Hypoglycemia    Her past surgical history is positive for: Past Surgical History:  Procedure Laterality Date  . CARPAL TUNNEL RELEASE Bilateral   . CESAREAN SECTION     ECOG FS:0 - Asymptomatic  Review of systems- Review of Systems  Constitutional: Negative.  Negative for chills, fever, malaise/fatigue and weight loss.  HENT: Negative for congestion, ear pain and tinnitus.   Eyes: Negative.  Negative for blurred vision and double vision.  Respiratory: Positive for cough and shortness of breath. Negative for sputum production.   Cardiovascular: Negative.  Negative for chest pain, palpitations and leg swelling.  Gastrointestinal: Negative.  Negative for abdominal pain, constipation, diarrhea, nausea and vomiting.  Genitourinary: Negative for dysuria, frequency and urgency.  Musculoskeletal: Negative for back pain and falls.  Skin: Negative.  Negative for rash.  Neurological: Negative.  Negative for weakness and headaches.  Endo/Heme/Allergies: Negative.  Does not bruise/bleed easily.  Psychiatric/Behavioral: Negative.  Negative for depression. The patient is not nervous/anxious and does not have insomnia.      No Known Allergies  Social History   Socioeconomic History  . Marital status: Divorced    Spouse name: Not on file  . Number of children: Not on file  . Years of education: Not on file  . Highest education level: Not on file  Occupational History  . Not on file  Tobacco Use  . Smoking status: Former Smoker    Packs/day: 1.50    Years: 30.00    Pack years: 45.00    Types: Cigarettes     Quit date: 08/01/2018    Years since quitting: 0.9  . Smokeless tobacco: Never Used  Substance and Sexual Activity  . Alcohol use: No  . Drug use: No  . Sexual activity: Yes  Other Topics Concern  . Not on file  Social History Narrative  . Not on file   Social Determinants of Health   Financial Resource Strain:   . Difficulty of Paying Living Expenses: Not on file  Food Insecurity:   . Worried About Programme researcher, broadcasting/film/video in the Last Year: Not on file  . Ran Out of Food in the Last Year: Not on file  Transportation Needs:   . Lack of Transportation (Medical): Not on file  . Lack of Transportation (Non-Medical): Not on file  Physical Activity:   . Days of Exercise per Week: Not on file  . Minutes of Exercise per Session: Not on file  Stress:   . Feeling of Stress : Not on file  Social Connections:   . Frequency of Communication with Friends and Family: Not on file  . Frequency of Social Gatherings with Friends and Family: Not on file  . Attends Religious Services: Not on file  . Active Member of  Clubs or Organizations: Not on file  . Attends Banker Meetings: Not on file  . Marital Status: Not on file  Intimate Partner Violence:   . Fear of Current or Ex-Partner: Not on file  . Emotionally Abused: Not on file  . Physically Abused: Not on file  . Sexually Abused: Not on file    Family History  Problem Relation Age of Onset  . Cancer Mother        lung  . Diabetes Mother   . Stroke Mother   . Hypertension Mother   . Cancer Father      Current Outpatient Medications:  .  amphetamine-dextroamphetamine (ADDERALL) 30 MG tablet, Take 1 tablet by mouth 2 (two) times daily., Disp: 60 tablet, Rfl: 0 .  diclofenac sodium (VOLTAREN) 1 % GEL, Apply 2 g topically 3 (three) times daily., Disp: 1 Tube, Rfl: 2 .  FLUoxetine (PROZAC) 40 MG capsule, Take 1 capsule by mouth once daily, Disp: 90 capsule, Rfl: 1 .  lithium 300 MG tablet, Take 1 tablet by mouth twice daily,  Disp: 60 tablet, Rfl: 0  Physical exam: There were no vitals filed for this visit. Limited d/t telephone visit   CMP Latest Ref Rng & Units 11/24/2018  Glucose 65 - 99 mg/dL 91  BUN 6 - 24 mg/dL 11  Creatinine 5.36 - 1.44 mg/dL 3.15  Sodium 400 - 867 mmol/L 140  Potassium 3.5 - 5.2 mmol/L 4.0  Chloride 96 - 106 mmol/L 102  CO2 20 - 29 mmol/L 26  Calcium 8.7 - 10.2 mg/dL 9.5  Total Protein 6.0 - 8.5 g/dL 6.4  Total Bilirubin 0.0 - 1.2 mg/dL 0.3  Alkaline Phos 39 - 117 IU/L 46  AST 0 - 40 IU/L 17  ALT 0 - 32 IU/L 13   CBC Latest Ref Rng & Units 11/24/2018  WBC 3.4 - 10.8 x10E3/uL 7.1  Hemoglobin 11.1 - 15.9 g/dL 61.9  Hematocrit 50.9 - 46.6 % 37.7  Platelets 150 - 450 x10E3/uL 338    No images are attached to the encounter.  CT Chest Wo Contrast  Result Date: 07/13/2019 CLINICAL DATA:  44 year old female with history of pulmonary nodule. Shortness of breath for the past 2-3 weeks. EXAM: CT CHEST WITHOUT CONTRAST TECHNIQUE: Multidetector CT imaging of the chest was performed following the standard protocol without IV contrast. COMPARISON:  Chest CT 06/02/2018. FINDINGS: Cardiovascular: Heart size is normal. There is no significant pericardial fluid, thickening or pericardial calcification. Aortic atherosclerosis. No definite coronary artery calcifications. Mediastinum/Nodes: No pathologically enlarged mediastinal or hilar lymph nodes. Please note that accurate exclusion of hilar adenopathy is limited on noncontrast CT scans. Esophagus is unremarkable in appearance. No axillary lymphadenopathy. Lungs/Pleura: When compared to the prior study from 06/02/2018, the number of previously noted pulmonary nodules has decreased, and the remaining nodules all appear decreased in size. This is best demonstrated by the largest nodule in the right upper lobe (axial image 35 of series 3) which currently measures 6 x 4 mm (previously 7 x 6 mm on 06/02/2018). These nodules are again mid to upper lung  predominant. No larger more suspicious appearing pulmonary nodules or masses are noted. No confluent consolidative airspace disease. No pleural effusions. Diffuse bronchial wall thickening with mild centrilobular and paraseptal emphysema. Upper Abdomen: 1.5 x 1.0 cm calcified gallstone lying dependently in the gallbladder. No findings to suggest an acute cholecystitis at this time. Aortic atherosclerosis. Musculoskeletal: There are no aggressive appearing lytic or blastic lesions noted in the visualized  portions of the skeleton. IMPRESSION: 1. Partial regression of mid to upper lung predominant pulmonary nodules, as detailed above, compatible with a benign infectious or inflammatory etiology. 2. Diffuse bronchial wall thickening with mild centrilobular and paraseptal emphysema; imaging findings suggestive of underlying COPD. 3. Aortic atherosclerosis. 4. Cholelithiasis. Aortic Atherosclerosis (ICD10-I70.0) and Emphysema (ICD10-J43.9). Electronically Signed   By: Trudie Reedaniel  Entrikin M.D.   On: 07/13/2019 08:59   Assessment and plan- Patient is a 44 y.o. female who presents to pulmonary nodule clinic for follow-up of incidental lung nodules.  A telephone visit was conducted to review most recent CT scan results.    CT chest without contrast from 07/12/2019 revealed partial regression of mid to upper lung predominant pulmonary nodules as described.  When comparing the scan to 06/02/2018 imaging the number of previously noted pulmonary nodules has decreased and the remaining nodules all appear decreased in size.  This is best demonstrated by the largest nodule in the right upper lobe which currently measures 6 x 4 mm previously 7 x 6 mm on 06/02/2018.  These nodules are again mid to upper lung predominant.  No larger more suspicious appearing pulmonary nodules or masses are noted.   Calculating malignancy probability of a pulmonary nodule: Risk factors include: 1.  Age. 2.  Cancer history. 3.  Diameter of pulmonary  nodule and mm 4.  Location 5.  Smoking history 6.  Spiculation present   Based on risk factors, this patient is HIGH risk for the development of lung cancer.  I would recommend a 12 -month follow-up with imaging to ensure stability given smoking history and persistent nodules although improving.   If imaging is stable in 12 months, she will not need any additional follow-up in our clinic.  Unfortunately, she does not meet criteria to be enrolled in our annual low-dose CT screening program.  Patient would qualify when she is 44 years old for at least 1 to 2 years unless she began smoking again.  Referral to be made at that time.   During our visit, we discussed pulmonary nodules are a common incidental finding and are often how lung cancer is discovered.  Lung cancer survival is directly related to the stage at diagnosis.  We discussed that nodules can vary in presentation from solitary pulmonary nodules to masses, 2 groundglass opacities and multiple nodules.  Pulmonary nodules in the majority of cases are benign but the probability of these becoming malignant cannot be undermined.  Early identification of malignant nodules could lead to early diagnosis and increased survival.   We discussed the probability of pulmonary nodules becoming malignant increase with age, pack years of tobacco use, size/characteristics of the nodule and location; with upper lobe involvement being most worrisome.   We discussed the goal of our clinic is to thoroughly evaluate each nodule, developed a comprehensive, individualized plan of care utilizing the most advanced technology and significantly reduce the time from detection to treatment.  A dedicated pulmonary nodule clinic has proven to indeed expedite the detection and treatment of lung cancer.   Patient education in fact sheet provided along with most recent CT scans.  Plan: Discussed CT scan results from 07/12/2019. Review personal and family history. Review  occupational exposures.  Disposition: RTC in 12 months for repeat CT chest without contrast. Referral to LDCT screening program once she meets criteria.   Visit Diagnosis 1. Lung nodule     Patient expressed understanding and was in agreement with this plan. She also understands that She can  call clinic at any time with any questions, concerns, or complaints.   Greater than 50% was spent in counseling and coordination of care with this patient including but not limited to discussion of the relevant topics above (See A&P) including, but not limited to diagnosis and management of acute and chronic medical conditions.   Thank you for allowing me to participate in the care of this very pleasant patient.    Jacquelin Hawking, NP [address] [facility] Cell - [phone] Pager- [phone] 07/13/2019 1:53 PM  CC: Katherine Mantle, NP

## 2019-07-15 ENCOUNTER — Encounter: Payer: Self-pay | Admitting: *Deleted

## 2019-07-27 ENCOUNTER — Ambulatory Visit: Payer: Self-pay | Admitting: General Practice

## 2019-07-27 ENCOUNTER — Telehealth: Payer: Self-pay | Admitting: Nurse Practitioner

## 2019-07-27 ENCOUNTER — Encounter: Payer: Self-pay | Admitting: General Practice

## 2019-07-27 ENCOUNTER — Other Ambulatory Visit: Payer: Self-pay | Admitting: Nurse Practitioner

## 2019-07-27 ENCOUNTER — Ambulatory Visit: Payer: No Typology Code available for payment source | Admitting: Pharmacist

## 2019-07-27 ENCOUNTER — Telehealth: Payer: No Typology Code available for payment source | Admitting: General Practice

## 2019-07-27 DIAGNOSIS — G9332 Myalgic encephalomyelitis/chronic fatigue syndrome: Secondary | ICD-10-CM

## 2019-07-27 DIAGNOSIS — R5382 Chronic fatigue, unspecified: Secondary | ICD-10-CM

## 2019-07-27 DIAGNOSIS — F322 Major depressive disorder, single episode, severe without psychotic features: Secondary | ICD-10-CM

## 2019-07-27 DIAGNOSIS — F411 Generalized anxiety disorder: Secondary | ICD-10-CM

## 2019-07-27 MED ORDER — LITHIUM CARBONATE 300 MG PO TABS: 300.0000 mg | ORAL_TABLET | Freq: Two times a day (BID) | ORAL | 6 refills | Status: DC

## 2019-07-27 MED ORDER — DICLOFENAC SODIUM 1 % EX GEL: 2.0000 g | Freq: Three times a day (TID) | CUTANEOUS | 3 refills | Status: AC

## 2019-07-27 MED ORDER — FLUOXETINE HCL 40 MG PO CAPS: 40.0000 mg | ORAL_CAPSULE | Freq: Every day | ORAL | 3 refills | Status: DC

## 2019-07-27 NOTE — Chronic Care Management (AMB) (Signed)
Chronic Care Management   Note  07/27/2019 Name: TAMELA ELSAYED MRN: [id number] DOB: 11/14/1975   Subjective:  Courtney Davidson is a 44 y.o. year old female who is a primary care patient of [name], Barbaraann Faster, NP. The CCM team was consulted for assistance with chronic disease management and care coordination needs.   Contacted patient for medication access needs.   Review of patient status, including review of consultants reports, laboratory and other test data, was performed as part of comprehensive evaluation and provision of chronic care management services.   Objective:  Lab Results  Component Value Date   CREATININE 0.85 11/24/2018   CREATININE 0.73 06/11/2018   CREATININE 0.82 12/25/2017    Lab Results  Component Value Date   HGBA1C 6.0 01/15/2016       Component Value Date/Time   CHOL 201 (H) 07/21/2016 1044   TRIG 141 07/21/2016 1044   HDL 36 (L) 07/21/2016 1044   LDLCALC 137 (H) 07/21/2016 1044    BP Readings from Last 3 Encounters:  12/28/18 117/75  11/24/18 113/77  08/27/18 112/76    No Known Allergies  Medications Reviewed Today    Reviewed by De Hollingshead, [facility] (Pharmacist) on 07/27/19 at 1557  Med List Status: <None>  Medication Order Taking? Sig Documenting Provider Last Dose Status Informant  amphetamine-dextroamphetamine (ADDERALL) 30 MG tablet [id number] Yes Take 1 tablet by mouth 2 (two) times daily. Marnee Guarneri T, NP Taking Active            Med Note (TATE, PAMELA J   Wed Jul 27, 2019 11:58 AM) Needs an office visit to get refill, only has a 2 day supply left per the patient  diclofenac sodium (VOLTAREN) 1 % GEL [id number] No Apply 2 g topically 3 (three) times daily.  Patient not taking: Reported on 07/27/2019   Marnee Guarneri T, NP Not Taking Active   FLUoxetine (PROZAC) 40 MG capsule [id number] No Take 1 capsule by mouth once daily  Patient not taking: Reported on 07/27/2019   Marnee Guarneri T, NP Not Taking Active              Med Note (TATE, PAMELA J   Wed Jul 27, 2019 11:58 AM) Cannot afford per the patient  lithium 300 MG tablet [id number] No Take 1 tablet by mouth twice daily  Patient not taking: Reported on 07/27/2019   Marnee Guarneri T, NP Not Taking Active            Med Note (TATE, PAMELA J   Wed Jul 27, 2019 11:58 AM) Cannot afford per the patient  naproxen sodium (ALEVE) 220 MG tablet [id number] Yes Take 220 mg by mouth 2 (two) times daily as needed. [provider] Taking Active Self           Assessment:   Goals Addressed            This Visit's Progress     Patient Stated   . PharmD "I don't have my medications" (pt-stated)       Current Barriers:  . Polypharmacy; complex patient with multiple comorbidities including depression, anxiety, chronic fatigue syndrome . Self-manages medications. Notes that she lost insurance in July, and has been unable to afford her medications. Has continued to take Adderall, and is utilizing a GoodRx card to help w/ the cost at [facility] o Depression/anxiety: lithium 300 mg BID, fluoxetine 40 mg daily - patient has run out of both of these  o Chronic fatigue syndrome: Adderall  30 mg BID; notes she has 2 days left of this o Pain: voltaren gel PRN; notes she is out of this  Pharmacist Clinical Goal(s):  Marland Kitchen Over the next 30 days, patient will work with PharmD and provider towards optimized medication management  Interventions: . Comprehensive medication review performed; medication list updated in electronic medical record . Communicated w/ Milagros Reap asking her to outreach patient to screen and discuss enrollment in [facility] . Explained to patient that [facility] does not fill controlled substances, so she will need to continue to get Adderall at [facility]. She verbalized understanding. . Will collaborate w/ Aura Dials to order fluoxetine, lithium, and diclofenac gel to [facility]. Patient is to be scheduling a follow up  appointment w/ PCP for medication refills   Patient Self Care Activities:  . Patient will take medications as prescribed . Patient will collaborate w/ healthcare team as above  Initial goal documentation        Plan: - Will collaborate w/ [facility] PharmD as above - Will collaborate w/ provider as above - Will outreach patient in ~1 week to ensure connection to [facility]  Catie Feliz Beam, PharmD, [facility] Clinical Pharmacist [facility] [phone]

## 2019-07-27 NOTE — Chronic Care Management (AMB) (Signed)
Care Management   Initial Visit Note  07/27/2019 Name: Courtney Davidson MRN: [id number] DOB: 03-28-1976  Subjective: "I cannot afford my medications" "I cannot pay for my doctors visits"    Assessment: Courtney Davidson is a 44 y.o. year old female who sees Marnee Guarneri T, NP for primary care. The care management team was consulted for assistance with care management and care coordination needs related to Care Coordination Other Resources to help with the patient who does not currently have insurance.   Review of patient status, including review of consultants reports, relevant laboratory and other test results, and collaboration with appropriate care team members and the patient's provider was performed as part of comprehensive patient evaluation and provision of care management services.    SDOH (Social Determinants of Health) screening performed today: Biomedical engineer  Food Insecurity  Depression   Tobacco Use Stress. See Care Plan for related entries.    Outpatient Encounter Medications as of 07/27/2019  Medication Sig Note   amphetamine-dextroamphetamine (ADDERALL) 30 MG tablet Take 1 tablet by mouth 2 (two) times daily. 07/27/2019: Needs an office visit to get refill, only has a 2 day supply left per the patient   naproxen sodium (ALEVE) 220 MG tablet Take 220 mg by mouth 2 (two) times daily as needed.    diclofenac sodium (VOLTAREN) 1 % GEL Apply 2 g topically 3 (three) times daily. (Patient not taking: Reported on 07/27/2019)    FLUoxetine (PROZAC) 40 MG capsule Take 1 capsule by mouth once daily (Patient not taking: Reported on 07/27/2019) 07/27/2019: Cannot afford per the patient   lithium 300 MG tablet Take 1 tablet by mouth twice daily (Patient not taking: Reported on 07/27/2019) 07/27/2019: Cannot afford per the patient   No facility-administered encounter medications on file as of 07/27/2019.    Goals Addressed            This Visit's Progress     RN: I cannot pay for my doctors visits. I cannot afford to get my medication       Current Barriers:   Knowledge Deficits related to the inability to maintain health and well being due to not having health insurance and the inability to pay for healthcare   Financial Constraints.   Non-adherence to scheduled provider appointments  Non-adherence to prescribed medication regimen  Nurse Case Manager Clinical Goal(s):   Over the next 60 days, patient will work with CCM team and pcp to address needs related to inability to afford healthcare and medications to maintain health and well being  Over the next 60 days, patient will attend all scheduled medical appointments: Needs an appointment with the pcp for follow up and medication refills  Over the next 60 days, patient will demonstrate improved adherence to prescribed treatment plan for depression, lung nodules and chronic pain as evidenced by keeping scheduled appointments, taking medications as prescribed, and getting needed resources to meet healthcare  goals  Over the next 60 days, patient will work with CM team pharmacist to get needed medications refilled and know available resources to help due to not having insurance  Over the next 60 days, patient will work with CM clinical social worker to gain knowledge on helpful coping mechanisms and support for depression  Over the next 60 days, patient will work with the care guides to get resources to assist with paying for medical expenses, doctor visits and medications  Interventions:   Evaluation of current treatment plan related to depression, chronic  pain and lung nodules (stable)  and patient's adherence to plan as established by provider.  Advised patient to go to Medicaid.gov and apply for Medicaid  Reviewed medications with patient and discussed pharmacist outreach scheduled for later today  Collaborated with CCM team  regarding patients inability to manage her health and  well being due to having no insurance and needing community resources  Discussed plans with patient for ongoing care management follow up and provided patient with direct contact information for care management team  Care Guide referral for financial difficulties and applying for programs to assist with meeting healthcare needs  Social Work referral for additional resource help due to no insurance and Patent attorney for the patients depression  Pharmacy referral for noncompliance with medication regimen due to the inability to pay for medication refills  Front office staff to call and set up and appointment with pcp for evaluation and medications refills [employer] office staff has attempted to call the patient but has been unsuccessful at reaching today)  Patient Self Care Activities:   Patient verbalizes understanding of plan to involve the interdisciplinary team to assist the patient in meeting her healthcare needs  Self administers medications as prescribed  Attends all scheduled provider appointments  Calls provider office for new concerns or questions  Unable to independently manage chronic health conditions due to currently not having insurance   Does not attend all scheduled provider appointments  Does not adhere to prescribed medication regimen  Initial goal documentation         Follow up plan:  Telephone follow up appointment with care management team member scheduled for: 09-07-2019 at 1245  [name] was given information about Care Management services today including:  1. Care Management services include personalized support from designated clinical staff supervised by a physician, including individualized plan of care and coordination with other care providers 2. 24/7 contact phone numbers for assistance for urgent and routine care needs. 3. The patient may stop Care Management services at any time (effective at the end of the month) by phone call to the  office staff.  Patient agreed to services and verbal consent obtained.  Alto Denver RN, MSN, CCM Community Care Coordinator [place]   [facility] Mobile: [phone]

## 2019-07-27 NOTE — Patient Instructions (Signed)
Visit Information  Goals Addressed            This Visit's Progress     Patient Stated   . PharmD "I don't have my medications" (pt-stated)       Current Barriers:  . Polypharmacy; complex patient with multiple comorbidities including depression, anxiety, chronic fatigue syndrome . Self-manages medications. Notes that she lost insurance in July, and has been unable to afford her medications. Has continued to take Adderall, and is utilizing a GoodRx card to help w/ the cost at [facility] o Depression/anxiety: lithium 300 mg BID, fluoxetine 40 mg daily - patient has run out of both of these  o Chronic fatigue syndrome: Adderall 30 mg BID; notes she has 2 days left of this o Pain: voltaren gel PRN; notes she is out of this  Pharmacist Clinical Goal(s):  Marland Kitchen Over the next 30 days, patient will work with PharmD and provider towards optimized medication management  Interventions: . Comprehensive medication review performed; medication list updated in electronic medical record . Communicated w/ Milagros Reap asking her to outreach patient to screen and discuss enrollment in [facility] . Explained to patient that [facility] does not fill controlled substances, so she will need to continue to get Adderall at [facility]. She verbalized understanding. . Will collaborate w/ Aura Dials to order fluoxetine, lithium, and diclofenac gel to [facility]. Patient is to be scheduling a follow up appointment w/ PCP for medication refills   Patient Self Care Activities:  . Patient will take medications as prescribed . Patient will collaborate w/ healthcare team as above  Initial goal documentation        The patient verbalized understanding of instructions provided today and declined a print copy of patient instruction materials.   Plan: - Will collaborate w/ [facility] PharmD as above - Will collaborate w/ provider as above - Will outreach patient in ~1 week to ensure connection  to [facility]  Catie Feliz Beam, PharmD, [facility] Clinical Pharmacist [facility] [phone]

## 2019-07-27 NOTE — Telephone Encounter (Signed)
Called pt to schedule a medication refill appt, no answer, no vm

## 2019-07-27 NOTE — Telephone Encounter (Signed)
Patient notes she is near her phone this afternoon. Could we please try to outreach her again? Thanks!

## 2019-07-27 NOTE — Telephone Encounter (Signed)
Scheduled

## 2019-07-27 NOTE — Patient Instructions (Signed)
Visit Information  Goals Addressed            This Visit's Progress   . RN: I cannot pay for my doctors visits. I cannot afford to get my medication       Current Barriers:  Marland Kitchen Knowledge Deficits related to the inability to maintain health and well being due to not having health insurance and the inability to pay for healthcare  . Corporate treasurer.  . Non-adherence to scheduled provider appointments . Non-adherence to prescribed medication regimen  Nurse Case Manager Clinical Goal(s):  Marland Kitchen Over the next 60 days, patient will work with CCM team and pcp to address needs related to inability to afford healthcare and medications to maintain health and well being . Over the next 60 days, patient will attend all scheduled medical appointments: Needs an appointment with the pcp for follow up and medication refills . Over the next 60 days, patient will demonstrate improved adherence to prescribed treatment plan for depression, lung nodules and chronic pain as evidenced by keeping scheduled appointments, taking medications as prescribed, and getting needed resources to meet healthcare  goals . Over the next 60 days, patient will work with CM team pharmacist to get needed medications refilled and know available resources to help due to not having insurance . Over the next 60 days, patient will work with CM clinical social worker to gain knowledge on helpful coping mechanisms and support for depression . Over the next 60 days, patient will work with the care guides to get resources to assist with paying for medical expenses, doctor visits and medications  Interventions:  . Evaluation of current treatment plan related to depression, chronic pain and lung nodules (stable)  and patient's adherence to plan as established by provider. . Advised patient to go to Medicaid.gov and apply for Medicaid . Reviewed medications with patient and discussed pharmacist outreach scheduled for later  today . Collaborated with CCM team  regarding patients inability to manage her health and well being due to having no insurance and needing community resources . Discussed plans with patient for ongoing care management follow up and provided patient with direct contact information for care management team . Care Guide referral for financial difficulties and applying for programs to assist with meeting healthcare needs . Social Work referral for additional resource help due to no Optometrist for the patients depression . Pharmacy referral for noncompliance with medication regimen due to the inability to pay for medication refills . Front office staff to call and set up and appointment with pcp for evaluation and medications refills [employer] office staff has attempted to call the patient but has been unsuccessful at reaching today)  Patient Self Care Activities:  . Patient verbalizes understanding of plan to involve the interdisciplinary team to assist the patient in meeting her healthcare needs . Self administers medications as prescribed . Attends all scheduled provider appointments . Calls provider office for new concerns or questions . Unable to independently manage chronic health conditions due to currently not having insurance  . Does not attend all scheduled provider appointments . Does not adhere to prescribed medication regimen  Initial goal documentation        Ms. [name] was given information about Care Management services today including:  1. Care Management services include personalized support from designated clinical staff supervised by her physician, including individualized plan of care and coordination with other care providers 2. 24/7 contact phone numbers for assistance for urgent and routine care  needs. 3. The patient may stop CCM services at any time (effective at the end of the month) by phone call to the office staff.  Patient agreed to  services and verbal consent obtained.   The patient verbalized understanding of instructions provided today and declined a print copy of patient instruction materials.   Telephone follow up appointment with care management team member scheduled for: 09-07-2019 at 12:45 pm  Noreene Larsson RN, MSN, [facility] Mobile: [phone]

## 2019-07-29 ENCOUNTER — Encounter: Payer: Self-pay | Admitting: Nurse Practitioner

## 2019-07-29 ENCOUNTER — Ambulatory Visit (INDEPENDENT_AMBULATORY_CARE_PROVIDER_SITE_OTHER): Payer: Self-pay | Admitting: Nurse Practitioner

## 2019-07-29 ENCOUNTER — Other Ambulatory Visit: Payer: Self-pay

## 2019-07-29 ENCOUNTER — Ambulatory Visit: Payer: Self-pay | Admitting: Pharmacist

## 2019-07-29 DIAGNOSIS — F322 Major depressive disorder, single episode, severe without psychotic features: Secondary | ICD-10-CM

## 2019-07-29 DIAGNOSIS — G9332 Myalgic encephalomyelitis/chronic fatigue syndrome: Secondary | ICD-10-CM

## 2019-07-29 DIAGNOSIS — R5382 Chronic fatigue, unspecified: Secondary | ICD-10-CM

## 2019-07-29 DIAGNOSIS — F411 Generalized anxiety disorder: Secondary | ICD-10-CM

## 2019-07-29 MED ORDER — FLUOXETINE HCL 40 MG PO CAPS: 40.0000 mg | ORAL_CAPSULE | Freq: Every day | ORAL | 3 refills | Status: DC

## 2019-07-29 MED ORDER — AMPHETAMINE-DEXTROAMPHETAMINE 30 MG PO TABS: 30.0000 mg | ORAL_TABLET | Freq: Two times a day (BID) | ORAL | 0 refills | Status: DC

## 2019-07-29 MED ORDER — LITHIUM CARBONATE 300 MG PO TABS: 300.0000 mg | ORAL_TABLET | Freq: Two times a day (BID) | ORAL | 6 refills | Status: DC

## 2019-07-29 NOTE — Patient Instructions (Signed)
Visit Information  Goals Addressed            This Visit's Progress     Patient Stated   . PharmD "I don't have my medications" (pt-stated)       Current Barriers:  . Polypharmacy; complex patient with multiple comorbidities including depression, anxiety, chronic fatigue syndrome . Self-manages medications. Notes that she lost insurance in July, and has been unable to afford her medications. Has continued to take Adderall, and is utilizing a GoodRx card to help w/ the cost at [facility] o Depression/anxiety: lithium 300 mg BID, fluoxetine 40 mg daily - patient has run out of both of these; sent to [facility] earlier this week by PCP o Chronic fatigue syndrome: Adderall 30 mg BID; notes she has 2 days left of this o Pain: voltaren gel PRN; notes she is out of this  Pharmacist Clinical Goal(s):  Marland Kitchen Over the next 30 days, patient will work with PharmD and provider towards optimized medication management  Interventions: . Printed [facility] flyer w/ address and phone number for patient . Contacted [facility]. They have fluoxetine and lithium ready for patient to pick up. Provided this info to PCP during their appointment  Patient Self Care Activities:  . Patient will take medications as prescribed . Patient will collaborate w/ healthcare team as above  Please see past updates related to this goal by clicking on the "Past Updates" button in the selected goal         The patient verbalized understanding of instructions provided today and declined a print copy of patient instruction materials.   Plan:  - Will outreach patient in 1-2 weeks to ensure connection with [facility]  Catie Feliz Beam, PharmD, [facility] Clinical Pharmacist [facility] [phone]

## 2019-07-29 NOTE — Assessment & Plan Note (Signed)
Chronic, ongoing.  Continue current medication regimen.   Lithium and Prozac, which benefit patient mood.  She denies SI/HI.  Refills sent to medication management.

## 2019-07-29 NOTE — Chronic Care Management (AMB) (Signed)
Chronic Care Management   Follow Up Note   07/29/2019 Name: Courtney Davidson MRN: [id number] DOB: Mar 25, 1976  Referred by: Venita Lick, NP Reason for referral : Chronic Care Management (Medication Management)   DANYETTA GILLHAM is a 44 y.o. year old female who is a primary care patient of [name], Barbaraann Faster, NP. The CCM team was consulted for assistance with chronic disease management and care coordination needs.    Medication access needs.   Review of patient status, including review of consultants reports, relevant laboratory and other test results, and collaboration with appropriate care team members and the patient's provider was performed as part of comprehensive patient evaluation and provision of chronic care management services.    SDOH (Social Determinants of Health) screening performed today: Financial Strain . See Care Plan for related entries.   Outpatient Encounter Medications as of 07/29/2019  Medication Sig Note  . amphetamine-dextroamphetamine (ADDERALL) 30 MG tablet Take 1 tablet by mouth 2 (two) times daily.   . diclofenac Sodium (VOLTAREN) 1 % GEL Apply 2 g topically 3 (three) times daily.   Marland Kitchen FLUoxetine (PROZAC) 40 MG capsule Take 1 capsule (40 mg total) by mouth daily.   Marland Kitchen lithium 300 MG tablet Take 1 tablet (300 mg total) by mouth 2 (two) times daily.   . naproxen sodium (ALEVE) 220 MG tablet Take 220 mg by mouth 2 (two) times daily as needed.   . [DISCONTINUED] amphetamine-dextroamphetamine (ADDERALL) 30 MG tablet Take 1 tablet by mouth 2 (two) times daily. 07/27/2019: Needs an office visit to get refill, only has a 2 day supply left per the patient  . [DISCONTINUED] FLUoxetine (PROZAC) 40 MG capsule Take 1 capsule (40 mg total) by mouth daily.   . [DISCONTINUED] lithium 300 MG tablet Take 1 tablet (300 mg total) by mouth 2 (two) times daily.    No facility-administered encounter medications on file as of 07/29/2019.     Objective:   Goals Addressed              This Visit's Progress     Patient Stated   . PharmD "I don't have my medications" (pt-stated)       Current Barriers:  . Polypharmacy; complex patient with multiple comorbidities including depression, anxiety, chronic fatigue syndrome . Self-manages medications. Notes that she lost insurance in July, and has been unable to afford her medications. Has continued to take Adderall, and is utilizing a GoodRx card to help w/ the cost at [facility] o Depression/anxiety: lithium 300 mg BID, fluoxetine 40 mg daily - patient has run out of both of these; sent to [facility] earlier this week by PCP o Chronic fatigue syndrome: Adderall 30 mg BID; notes she has 2 days left of this o Pain: voltaren gel PRN; notes she is out of this  Pharmacist Clinical Goal(s):  Marland Kitchen Over the next 30 days, patient will work with PharmD and provider towards optimized medication management  Interventions: . Printed [facility] flyer w/ address and phone number for patient . Contacted [facility]. They have fluoxetine and lithium ready for patient to pick up. Provided this info to PCP during their appointment  Patient Self Care Activities:  . Patient will take medications as prescribed . Patient will collaborate w/ healthcare team as above  Please see past updates related to this goal by clicking on the "Past Updates" button in the selected goal          Plan:  - Will outreach patient in 1-2 weeks to ensure  connection with [facility]  Catie Feliz Beam, PharmD, [facility] Clinical Pharmacist [facility] [phone]

## 2019-07-29 NOTE — Progress Notes (Signed)
BP 116/79   Pulse 93   Temp 98.4 F (36.9 C) (Oral)   Ht _0  (1.626 m)   Wt 158 lb (71.7 kg)   SpO2 100%   BMI 27.12 kg/m    Subjective:    Patient ID: Courtney Davidson, female    DOB: Jan 16, 1976, 44 y.o.   MRN: [id number]  HPI: Courtney Davidson is a 44 y.o. female  Chief Complaint  Patient presents with  . Depression  . Fatigue    DEPRESSION Continues on Prozac and Adderall + Lithium.  Currently Adderall paid for out of pocket,  $33.  Adderall currently more for chronic fatigue syndrome, she has been on this for several years and does not wish to discontinue.  Reports improvement in fatigue with this medication. Lost job back in July and currently no insurance.  Has been unable to obtain Prozac and Lithium for 6 months and endorses worsening mood with frequent tearfulness.  Denies SI/HI.  She is working with CCM team on assistance options. Mood status: controlled Mood status: stable Satisfied with current treatment?: yes Symptom severity: moderate  Duration of current treatment : chronic Side effects: no Medication compliance: good compliance Psychotherapy/counseling: none Depressed mood: yes Anxious mood: sometimes Anhedonia: no Significant weight loss or gain: no Insomnia: yes hard to fall asleep Fatigue: yes Feelings of worthlessness or guilt: yes Impaired concentration/indecisiveness: yes Suicidal ideations: no Hopelessness: no Crying spells: yes Depression screen [facility] 2/9 07/29/2019 12/28/2018 11/16/2018 08/17/2018 08/17/2018  Decreased Interest 2 1 0 3 3  Down, Depressed, Hopeless 3 1 0 0 0  PHQ - 2 Score 5 2 0 3 3  Altered sleeping _1 - 0  Tired, decreased energy _2 - 1  Change in appetite 1 0 0 - 0  Feeling bad or failure about yourself  2 0 0 - 0  Trouble concentrating 2 0 0 - 0  Moving slowly or fidgety/restless 0 0 0 - 0  Suicidal thoughts 0 0 0 - 0  PHQ-9 Score _3 - 4  Difficult doing work/chores Very difficult Not difficult at all  Somewhat difficult - Not difficult at all  Some recent data might be hidden   GAD 7 : Generalized Anxiety Score 07/29/2019 07/29/2019 12/28/2018 08/17/2018  Nervous, Anxious, on Edge 2 3 0 3  Control/stop worrying _4 Worry too much - different things _5 Trouble relaxing _6 0  Restless 0 1 1 0  Easily annoyed or irritable _7 Afraid - awful might happen 1 0 0 0  Total GAD 7 Score _8 Anxiety Difficulty Somewhat difficult Very difficult Not difficult at all Somewhat difficult    Relevant past medical, surgical, family and social history reviewed and updated as indicated. Interim medical history since our last visit reviewed. Allergies and medications reviewed and updated.  Review of Systems  Constitutional: Positive for fatigue. Negative for activity change, appetite change, diaphoresis and fever.  Respiratory: Negative for cough, chest tightness and shortness of breath.   Cardiovascular: Negative for chest pain, palpitations and leg swelling.  Psychiatric/Behavioral: Positive for decreased concentration and sleep disturbance. Negative for self-injury and suicidal ideas. The patient is nervous/anxious.     Per HPI unless specifically indicated above     Objective:    BP 116/79   Pulse 93   Temp 98.4 F (36.9 C) (Oral)   Ht _9  (1.626  m)   Wt 158 lb (71.7 kg)   SpO2 100%   BMI 27.12 kg/m   Wt Readings from Last 3 Encounters:  07/29/19 158 lb (71.7 kg)  11/16/18 150 lb (68 kg)  08/27/18 150 lb 12.8 oz (68.4 kg)    Physical Exam Vitals and nursing note reviewed.  Constitutional:      General: She is awake. She is not in acute distress.    Appearance: She is well-developed. She is not ill-appearing.  HENT:     Head: Normocephalic.     Right Ear: Hearing normal.     Left Ear: Hearing normal.  Eyes:     General: Lids are normal.        Right eye: No discharge.        Left eye: No discharge.     Conjunctiva/sclera: Conjunctivae normal.      Pupils: Pupils are equal, round, and reactive to light.  Neck:     Vascular: No carotid bruit.  Cardiovascular:     Rate and Rhythm: Normal rate and regular rhythm.     Heart sounds: Normal heart sounds. No murmur. No gallop.   Pulmonary:     Effort: Pulmonary effort is normal. No accessory muscle usage or respiratory distress.     Breath sounds: Normal breath sounds.  Abdominal:     General: Bowel sounds are normal.     Palpations: Abdomen is soft.  Musculoskeletal:     Cervical back: Normal range of motion and neck supple.     Right lower leg: No edema.     Left lower leg: No edema.  Skin:    General: Skin is warm and dry.  Neurological:     Mental Status: She is alert and oriented to person, place, and time.  Psychiatric:        Attention and Perception: Attention normal.        Mood and Affect: Mood is depressed. Affect is tearful.        Behavior: Behavior normal. Behavior is cooperative.        Thought Content: Thought content normal.        Judgment: Judgment normal.     Results for orders placed or performed in visit on 11/24/18  Uric acid  Result Value Ref Range   Uric Acid 3.3 2.5 - 7.1 mg/dL  VITAMIN D 25 Hydroxy (Vit-D Deficiency, Fractures)  Result Value Ref Range   Vit D, 25-Hydroxy 22.8 (L) 30.0 - 100.0 ng/mL  Comprehensive metabolic panel  Result Value Ref Range   Glucose 91 65 - 99 mg/dL   BUN 11 6 - 24 mg/dL   Creatinine, Ser 0.85 0.57 - 1.00 mg/dL   GFR calc non Af Amer 84 >59 mL/min/1.73   GFR calc Af Amer 96 >59 mL/min/1.73   BUN/Creatinine Ratio 13 9 - 23   Sodium 140 134 - 144 mmol/L   Potassium 4.0 3.5 - 5.2 mmol/L   Chloride 102 96 - 106 mmol/L   CO2 26 20 - 29 mmol/L   Calcium 9.5 8.7 - 10.2 mg/dL   Total Protein 6.4 6.0 - 8.5 g/dL   Albumin 4.3 3.8 - 4.8 g/dL   Globulin, Total 2.1 1.5 - 4.5 g/dL   Albumin/Globulin Ratio 2.0 1.2 - 2.2   Bilirubin Total 0.3 0.0 - 1.2 mg/dL   Alkaline Phosphatase 46 39 - 117 IU/L   AST 17 0 - 40 IU/L    ALT 13 0 - 32 IU/L  CBC with Differential/Platelet  Result Value Ref Range   WBC 7.1 3.4 - 10.8 x10E3/uL   RBC 4.43 3.77 - 5.28 x10E6/uL   Hemoglobin 13.3 11.1 - 15.9 g/dL   Hematocrit 37.7 34.0 - 46.6 %   MCV 85 79 - 97 fL   MCH 30.0 26.6 - 33.0 pg   MCHC 35.3 31.5 - 35.7 g/dL   RDW 12.4 11.7 - 15.4 %   Platelets 338 150 - 450 x10E3/uL   Neutrophils 62 Not Estab. %   Lymphs 30 Not Estab. %   Monocytes 6 Not Estab. %   Eos 2 Not Estab. %   Basos 0 Not Estab. %   Neutrophils Absolute 4.4 1.4 - 7.0 x10E3/uL   Lymphocytes Absolute 2.1 0.7 - 3.1 x10E3/uL   Monocytes Absolute 0.4 0.1 - 0.9 x10E3/uL   EOS (ABSOLUTE) 0.2 0.0 - 0.4 x10E3/uL   Basophils Absolute 0.0 0.0 - 0.2 x10E3/uL   Immature Granulocytes 0 Not Estab. %   Immature Grans (Abs) 0.0 0.0 - 0.1 x10E3/uL  Thyroid Panel With TSH  Result Value Ref Range   TSH 0.894 0.450 - 4.500 uIU/mL   T4, Total 8.5 4.5 - 12.0 ug/dL   T3 Uptake Ratio 27 24 - 39 %   Free Thyroxine Index 2.3 1.2 - 4.9  C-reactive protein  Result Value Ref Range   CRP 1 0 - 10 mg/L  Sed Rate (ESR)  Result Value Ref Range   Sed Rate 4 0 - 32 mm/hr  ANA w/Reflex if Positive  Result Value Ref Range   Anti Nuclear Antibody (ANA) Negative Negative  Rheumatoid factor  Result Value Ref Range   Rhuematoid fact SerPl-aCnc <10.0 0.0 - 13.9 IU/mL  Lithium level  Result Value Ref Range   Lithium Lvl 0.2 (L) 0.6 - 1.2 mmol/L      Assessment & Plan:   Problem List Items Addressed This Visit      Nervous and Auditory   CFS (chronic fatigue syndrome)    Chronic, ongoing.  Has been on long term Adderall which offers benefit.  Continue current regimen and refill monthly.  Obtain UDS and contract next visit.  Refills sent today and PMP reviewed.        Other   Generalized anxiety disorder    Chronic, ongoing.  Continue current medication regimen.   Lithium and Prozac, which benefit patient mood.  She denies SI/HI.  Refills sent to medication management.       Relevant Medications   FLUoxetine (PROZAC) 40 MG capsule   Depression, major, single episode, severe (HCC)    Chronic, ongoing.  Denies SI/HI.  Continue current medication regimen, Prozac & Lithium, and adjust as needed.  Will obtain Lithium level next visit, pt refuses labs today.  Refills able to be sent to medication management to assist patient with cost.  This will be beneficial, as has been off this regimen for months and is having difficulty.        Relevant Medications   FLUoxetine (PROZAC) 40 MG capsule      Controlled substance.  Checked data base and no other refills of controlled substances noted.  Last Adderall refill 06/27/2019, with no other controlled substances noted on PMP.  Follow up plan: Return in about 3 months (around 10/27/2019) for Mood.

## 2019-07-29 NOTE — Assessment & Plan Note (Signed)
Chronic, ongoing.  Has been on long term Adderall which offers benefit.  Continue current regimen and refill monthly.  Obtain UDS and contract next visit.  Refills sent today and PMP reviewed.

## 2019-07-29 NOTE — Patient Instructions (Signed)

## 2019-07-29 NOTE — Assessment & Plan Note (Signed)
Chronic, ongoing.  Denies SI/HI.  Continue current medication regimen, Prozac & Lithium, and adjust as needed.  Will obtain Lithium level next visit, pt refuses labs today.  Refills able to be sent to medication management to assist patient with cost.  This will be beneficial, as has been off this regimen for months and is having difficulty.

## 2019-08-01 ENCOUNTER — Telehealth: Payer: Self-pay | Admitting: Pharmacy Technician

## 2019-08-01 NOTE — Telephone Encounter (Signed)
Provided patient with new patient packet to obtain [facility] services.  [facility] must receive current financial documentation in order to determine eligibility and provide medication assistance.  Keira Bohlin J. Nolin Grell Care Manager [facility] 

## 2019-08-02 ENCOUNTER — Ambulatory Visit: Payer: Self-pay | Admitting: Pharmacist

## 2019-08-02 DIAGNOSIS — R5382 Chronic fatigue, unspecified: Secondary | ICD-10-CM

## 2019-08-02 DIAGNOSIS — F411 Generalized anxiety disorder: Secondary | ICD-10-CM

## 2019-08-02 DIAGNOSIS — F322 Major depressive disorder, single episode, severe without psychotic features: Secondary | ICD-10-CM

## 2019-08-02 DIAGNOSIS — G9332 Myalgic encephalomyelitis/chronic fatigue syndrome: Secondary | ICD-10-CM

## 2019-08-02 NOTE — Chronic Care Management (AMB) (Signed)
  Chronic Care Management   Follow Up Note   08/02/2019 Name: Courtney Davidson MRN: [id number] DOB: 1976-03-04  Referred by: Marjie Skiff, NP Reason for referral : Chronic Care Management (Medication Management)   Courtney Davidson is a 44 y.o. year old female who is a primary care patient of [name], [name], NP. The CCM team was consulted for assistance with chronic disease management and care coordination needs.    Contacted patient to follow up on medication access needs.   Review of patient status, including review of consultants reports, relevant laboratory and other test results, and collaboration with appropriate care team members and the patient's provider was performed as part of comprehensive patient evaluation and provision of chronic care management services.    SDOH (Social Determinants of Health) screening performed today: Financial Strain  Depression   Stress. See Care Plan for related entries.   Outpatient Encounter Medications as of 08/02/2019  Medication Sig  . amphetamine-dextroamphetamine (ADDERALL) 30 MG tablet Take 1 tablet by mouth 2 (two) times daily.  . diclofenac Sodium (VOLTAREN) 1 % GEL Apply 2 g topically 3 (three) times daily.  Marland Kitchen FLUoxetine (PROZAC) 40 MG capsule Take 1 capsule (40 mg total) by mouth daily.  Marland Kitchen lithium 300 MG tablet Take 1 tablet (300 mg total) by mouth 2 (two) times daily.  . naproxen sodium (ALEVE) 220 MG tablet Take 220 mg by mouth 2 (two) times daily as needed.   No facility-administered encounter medications on file as of 08/02/2019.     Objective:   Goals Addressed            This Visit's Progress     Patient Stated   . PharmD "I don't have my medications" (pt-stated)       Current Barriers:  . Polypharmacy; complex patient with multiple comorbidities including depression, anxiety, chronic fatigue syndrome . Self-manages medications. Notes that she lost insurance in July, and has been unable to afford her medications.  Connected with medication management clinic o Depression/anxiety: lithium 300 mg BID, fluoxetine 40 mg daily - patient reports she was able to pick these up at [facility] on Friday o Chronic fatigue syndrome: Adderall 30 mg BID; notes she has 2 days left of this o Pain: voltaren gel PRN; notes she is out of this  Pharmacist Clinical Goal(s):  Marland Kitchen Over the next 30 days, patient will work with PharmD and provider towards optimized medication management  Interventions: . Reviewed with patient that for continued support from [facility], she needs to return the application packed provided by Milagros Reap. She verbalized understanding . Reviewed that [facility] does not carry controlled substances, so Adderall will need to continue to come from [place]. She verbalized understanding . Reviewed upcoming outreach from LCSW in ~1 month. Patient expressed appreciation  Patient Self Care Activities:  . Patient will take medications as prescribed . Patient will collaborate w/ healthcare team as above  Please see past updates related to this goal by clicking on the "Past Updates" button in the selected goal          Plan:  - Scheduled f/u call 09/27/19  Catie Feliz Beam, PharmD, [facility] Clinical Pharmacist [facility] [phone]

## 2019-08-02 NOTE — Patient Instructions (Signed)
Visit Information  Goals Addressed            This Visit's Progress     Patient Stated   . PharmD "I don't have my medications" (pt-stated)       Current Barriers:  . Polypharmacy; complex patient with multiple comorbidities including depression, anxiety, chronic fatigue syndrome . Self-manages medications. Notes that she lost insurance in July, and has been unable to afford her medications. Connected with medication management clinic o Depression/anxiety: lithium 300 mg BID, fluoxetine 40 mg daily - patient reports she was able to pick these up at [facility] on Friday o Chronic fatigue syndrome: Adderall 30 mg BID; notes she has 2 days left of this o Pain: voltaren gel PRN; notes she is out of this  Pharmacist Clinical Goal(s):  Marland Kitchen Over the next 30 days, patient will work with PharmD and provider towards optimized medication management  Interventions: . Reviewed with patient that for continued support from [facility], she needs to return the application packed provided by Milagros Reap. She verbalized understanding . Reviewed that [facility] does not carry controlled substances, so Adderall will need to continue to come from [place]. She verbalized understanding . Reviewed upcoming outreach from LCSW in ~1 month. Patient expressed appreciation  Patient Self Care Activities:  . Patient will take medications as prescribed . Patient will collaborate w/ healthcare team as above  Please see past updates related to this goal by clicking on the "Past Updates" button in the selected goal         The patient verbalized understanding of instructions provided today and declined a print copy of patient instruction materials.    Plan:  - Scheduled f/u call 09/27/19  Catie Feliz Beam, PharmD, [facility] Clinical Pharmacist [facility] [phone]

## 2019-08-29 ENCOUNTER — Other Ambulatory Visit: Payer: Self-pay | Admitting: Nurse Practitioner

## 2019-08-29 MED ORDER — AMPHETAMINE-DEXTROAMPHETAMINE 30 MG PO TABS: 30.0000 mg | ORAL_TABLET | Freq: Two times a day (BID) | ORAL | 0 refills | Status: DC

## 2019-08-29 NOTE — Telephone Encounter (Signed)
Last fill on PMP review 07/29/2019

## 2019-08-29 NOTE — Telephone Encounter (Signed)
Routing to provider  

## 2019-08-31 ENCOUNTER — Ambulatory Visit: Payer: Self-pay | Admitting: Pharmacy Technician

## 2019-08-31 ENCOUNTER — Other Ambulatory Visit: Payer: Self-pay

## 2019-08-31 DIAGNOSIS — Z79899 Other long term (current) drug therapy: Secondary | ICD-10-CM

## 2019-08-31 NOTE — Progress Notes (Signed)
Completed financial assistance application for [place] due to recent CT Scan and [facility] practice visit.  Patient agreed to be responsible for gathering financial information and forwarding to appropriate department in [place].    Completed [facility] application and contract.  Patient agreed to all terms of the [facility] contract.    Patient approved to receive medication assistance at [facility] until time for re-certification in 2022, and as long as eligibility criteria continues to be met.    Provided patient with community resource material based on her particular needs.     J.  Care Manager [facility]  

## 2019-09-07 ENCOUNTER — Telehealth: Payer: Self-pay

## 2019-09-07 ENCOUNTER — Ambulatory Visit: Payer: Self-pay | Admitting: General Practice

## 2019-09-07 NOTE — Chronic Care Management (AMB) (Signed)
  Chronic Care Management   Outreach Note  09/07/2019 Name: Courtney Davidson MRN: [id number] DOB: 14-Sep-1975  Referred by: Marjie Skiff, NP Reason for referral : Care Coordination (Follow up on care coordination needs and pain )   An unsuccessful telephone outreach was attempted today. The patient was referred to the case management team for assistance with care management and care coordination.   Follow Up Plan: The care management team will reach out to the patient again over the next 30 to 60 days.   Alto Denver RN, MSN, CCM Community Care Coordinator [place]  [facility] Mobile: [phone]

## 2019-09-09 ENCOUNTER — Telehealth: Payer: Self-pay

## 2019-09-15 ENCOUNTER — Telehealth: Payer: Self-pay | Admitting: Nurse Practitioner

## 2019-09-15 NOTE — Telephone Encounter (Signed)
° °  KNB 09/15/2019 1st Attempt   Name: Courtney Davidson    MRN: [id number]    DOB: September 11, 1975    AGE: 44 y.o.    GENDER: female    PCP Marjie Skiff, NP.   Called pt regarding [employer] Referral for financial assistance. Ms. Hunnell stated that she was not in a place to have a conversation and requested a c/b tomorrow afternoon  Follow up on:  3/18 in afternoon   Manuela Schwartz  Care Guide  Embedded Care Coordination [facility] Management [email or web address]   [phone]

## 2019-09-16 NOTE — Telephone Encounter (Signed)
° °  KNB 09/16/2019 2nd Attempt   Name: Courtney Davidson    MRN: [id number]    DOB: 1976/01/09    AGE: 44 y.o.    GENDER: female    PCP Marjie Skiff, NP.   Called pt regarding Community Resource Referral for [facility] Follow up on 3/23  Manuela Schwartz  Care Guide  Embedded Care Coordination [facility] Management [email or web address]   [phone]

## 2019-09-19 ENCOUNTER — Other Ambulatory Visit: Payer: Self-pay

## 2019-09-20 NOTE — Telephone Encounter (Signed)
   KNB 09/20/2019 3rd Attempt  Closing referral pending any other needs of patient.   Manuela Schwartz  Care Guide . Embedded Care Coordination [facility] Management [email or web address]  [phone]

## 2019-09-26 ENCOUNTER — Other Ambulatory Visit: Payer: Self-pay | Admitting: Nurse Practitioner

## 2019-09-26 MED ORDER — AMPHETAMINE-DEXTROAMPHETAMINE 30 MG PO TABS: 30.0000 mg | ORAL_TABLET | Freq: Two times a day (BID) | ORAL | 0 refills | Status: DC

## 2019-09-26 NOTE — Telephone Encounter (Signed)
Routing to provider  

## 2019-09-27 ENCOUNTER — Ambulatory Visit: Payer: Self-pay | Admitting: Pharmacist

## 2019-09-27 ENCOUNTER — Other Ambulatory Visit: Payer: Self-pay | Admitting: Nurse Practitioner

## 2019-09-27 DIAGNOSIS — F322 Major depressive disorder, single episode, severe without psychotic features: Secondary | ICD-10-CM

## 2019-09-27 MED ORDER — FLUOXETINE HCL 40 MG PO CAPS: 40.0000 mg | ORAL_CAPSULE | Freq: Every day | ORAL | 0 refills | Status: DC

## 2019-09-27 MED ORDER — LITHIUM CARBONATE 300 MG PO TABS: 300.0000 mg | ORAL_TABLET | Freq: Two times a day (BID) | ORAL | 0 refills | Status: DC

## 2019-09-27 NOTE — Chronic Care Management (AMB) (Signed)
  Chronic Care Management   Follow Up Note   09/27/2019 Name: Courtney Davidson MRN: [id number] DOB: 1975-10-15  Referred by: Marjie Skiff, NP Reason for referral : Chronic Care Management (Medication Management)   Courtney Davidson is a 44 y.o. year old female who is a primary care patient of [name], [name], NP. The CCM team was consulted for assistance with chronic disease management and care coordination needs.    Contacted patient for medication management review.   Review of patient status, including review of consultants reports, relevant laboratory and other test results, and collaboration with appropriate care team members and the patient's provider was performed as part of comprehensive patient evaluation and provision of chronic care management services.    SDOH (Social Determinants of Health) assessments performed: Yes See Care Plan activities for detailed interventions related to [facility])     Outpatient Encounter Medications as of 09/27/2019  Medication Sig  . amphetamine-dextroamphetamine (ADDERALL) 30 MG tablet Take 1 tablet by mouth 2 (two) times daily.  . diclofenac Sodium (VOLTAREN) 1 % GEL Apply 2 g topically 3 (three) times daily.  Marland Kitchen FLUoxetine (PROZAC) 40 MG capsule Take 1 capsule (40 mg total) by mouth daily. (Patient not taking: Reported on 09/27/2019)  . lithium 300 MG tablet Take 1 tablet (300 mg total) by mouth 2 (two) times daily. (Patient not taking: Reported on 09/27/2019)  . naproxen sodium (ALEVE) 220 MG tablet Take 220 mg by mouth 2 (two) times daily as needed.   No facility-administered encounter medications on file as of 09/27/2019.     Objective:   Goals Addressed            This Visit's Progress     Patient Stated   . PharmD "I don't have my medications" (pt-stated)       Current Barriers:  . Polypharmacy; complex patient with multiple comorbidities including depression, anxiety, chronic fatigue syndrome . Notes that she lost insurance in  July, and has been unable to afford her medications. Connected with [facility]ication Management Clinic. Completed application, but notes that she is still collecting some of the necessary financial information. Is completely out of fluoxetine and lithium right now o Depression/anxiety: lithium 300 mg BID, fluoxetine 40 mg daily- has been OUT of both of these for a couple of weeks. o Chronic fatigue syndrome: Adderall 30 mg BID;  o Pain: voltaren gel PRN;  Pharmacist Clinical Goal(s):  Marland Kitchen Over the next 30 days, patient will work with PharmD and provider towards optimized medication management  Interventions: . Reviewed [facility] list online. Appears both fluoxetine and lithium are on the list, so should be $4/30 day or $10/90 day. Patient requests that prescriptions for both of these medications be sent to [facility] for her to fill and pick up while she is finishing up gathering the necessary financial information for [facility]. Will collaborate w/ PCP on this  Patient Self Care Activities:  . Patient will take medications as prescribed . Patient will collaborate w/ healthcare team as above  Please see past updates related to this goal by clicking on the "Past Updates" button in the selected goal          Plan:  - Patient aware of necessary steps to receive medications for free from [facility]. She has my contact information for future questions or concerns.   Catie Feliz Beam, PharmD, [facility] Clinical Pharmacist [facility] [phone]

## 2019-09-27 NOTE — Patient Instructions (Signed)
Visit Information  Goals Addressed            This Visit's Progress     Patient Stated   . PharmD "I don't have my medications" (pt-stated)       Current Barriers:  . Polypharmacy; complex patient with multiple comorbidities including depression, anxiety, chronic fatigue syndrome . Notes that she lost insurance in July, and has been unable to afford her medications. Connected with [facility] Management Clinic. Completed application, but notes that she is still collecting some of the necessary financial information. Is completely out of fluoxetine and lithium right now o Depression/anxiety: lithium 300 mg BID, fluoxetine 40 mg daily- has been OUT of both of these for a couple of weeks. o Chronic fatigue syndrome: Adderall 30 mg BID;  o Pain: voltaren gel PRN;  Pharmacist Clinical Goal(s):  Marland Kitchen Over the next 30 days, patient will work with PharmD and provider towards optimized medication management  Interventions: . Reviewed [facility] list online. Appears both fluoxetine and lithium are on the list, so should be $4/30 day or $10/90 day. Patient requests that prescriptions for both of these medications be sent to [facility] for her to fill and pick up while she is finishing up gathering the necessary financial information for [facility] Management Clinic. Will collaborate w/ PCP on this  Patient Self Care Activities:  . Patient will take medications as prescribed . Patient will collaborate w/ healthcare team as above  Please see past updates related to this goal by clicking on the "Past Updates" button in the selected goal         Patient verbalizes understanding of instructions provided today.   Plan:  - Patient aware of necessary steps to receive medications for free from [facility]. She has my contact information for future questions or concerns.   Catie Feliz Beam, PharmD, [facility] Clinical Pharmacist [facility] [phone]

## 2019-10-17 ENCOUNTER — Ambulatory Visit: Payer: Self-pay

## 2019-10-17 NOTE — Chronic Care Management (AMB) (Signed)
  Care Management   Follow Up Note   10/17/2019 Name: ALVITA FANA MRN: [id number] DOB: 04/11/1976  Referred by: Marjie Skiff, NP Reason for referral : Care Coordination   CATHYE KREITER is a 44 y.o. year old female who is a primary care patient of [name], [name], NP. The care management team was consulted for assistance with care management and care coordination needs.    Review of patient status, including review of consultants reports, relevant laboratory and other test results, and collaboration with appropriate care team members and the patient's provider was performed as part of comprehensive patient evaluation and provision of chronic care management services.    LCSW completed CCM outreach attempt today to provide mental health support/resource connection but was unable to reach patient successfully. A HIPPA compliant voice message was unable to be left as mailbox had not been set up yet. LCSW rescheduled CCM SW appointment as well.  The care management team will reach out to the patient again over the next 30 days.   Dickie La, BSW, MSW, LCSW [facility] Care [facility] [place]  [facility] [email or web address] Phone: [phone]

## 2019-10-25 ENCOUNTER — Other Ambulatory Visit: Payer: Self-pay | Admitting: Nurse Practitioner

## 2019-10-25 MED ORDER — AMPHETAMINE-DEXTROAMPHETAMINE 30 MG PO TABS: 30.0000 mg | ORAL_TABLET | Freq: Two times a day (BID) | ORAL | 0 refills | Status: DC

## 2019-10-25 NOTE — Telephone Encounter (Signed)
Routing to provider  

## 2019-10-28 ENCOUNTER — Ambulatory Visit: Payer: Self-pay | Admitting: Nurse Practitioner

## 2019-10-31 ENCOUNTER — Ambulatory Visit: Payer: Self-pay

## 2019-10-31 NOTE — Chronic Care Management (AMB) (Signed)
  Care Management   Follow Up Note   10/31/2019 Name: Courtney Davidson MRN: [id number] DOB: 04/28/1976  Referred by: Marjie Skiff, NP Reason for referral : Care Coordination   Courtney Davidson is a 44 y.o. year old female who is a primary care patient of [name], [name], NP. The care management team was consulted for assistance with care management and care coordination needs.    Review of patient status, including review of consultants reports, relevant laboratory and other test results, and collaboration with appropriate care team members and the patient's provider was performed as part of comprehensive patient evaluation and provision of chronic care management services.    LCSW completed CCM outreach attempt today but was unable to reach patient successfully. A HIPPA compliant voice message was unable to be left as mailbox had not been set up yet. LCSW rescheduled CCM SW appointment.  Dickie La, BSW, MSW, LCSW [facility] [place]  [facility] [email or web address] Phone: [phone]

## 2019-11-25 ENCOUNTER — Other Ambulatory Visit: Payer: Self-pay

## 2019-11-25 ENCOUNTER — Other Ambulatory Visit: Payer: Self-pay | Admitting: Nurse Practitioner

## 2019-11-25 MED ORDER — AMPHETAMINE-DEXTROAMPHETAMINE 30 MG PO TABS: 30.0000 mg | ORAL_TABLET | Freq: Two times a day (BID) | ORAL | 0 refills | Status: DC

## 2019-11-25 NOTE — Telephone Encounter (Signed)
Called patient to schedule a follow up. Patient stated that she will call back to schedule.

## 2019-11-25 NOTE — Telephone Encounter (Signed)
Sent this through her chart -- she will need visit for further refills please

## 2019-11-25 NOTE — Telephone Encounter (Signed)
Routing to provider  

## 2019-12-19 ENCOUNTER — Telehealth (INDEPENDENT_AMBULATORY_CARE_PROVIDER_SITE_OTHER): Payer: Self-pay | Admitting: Nurse Practitioner

## 2019-12-19 ENCOUNTER — Encounter: Payer: Self-pay | Admitting: Nurse Practitioner

## 2019-12-19 DIAGNOSIS — F411 Generalized anxiety disorder: Secondary | ICD-10-CM

## 2019-12-19 DIAGNOSIS — R5382 Chronic fatigue, unspecified: Secondary | ICD-10-CM

## 2019-12-19 DIAGNOSIS — G9332 Myalgic encephalomyelitis/chronic fatigue syndrome: Secondary | ICD-10-CM

## 2019-12-19 DIAGNOSIS — F322 Major depressive disorder, single episode, severe without psychotic features: Secondary | ICD-10-CM

## 2019-12-19 DIAGNOSIS — Z599 Problem related to housing and economic circumstances, unspecified: Secondary | ICD-10-CM

## 2019-12-19 DIAGNOSIS — Z598 Other problems related to housing and economic circumstances: Secondary | ICD-10-CM

## 2019-12-19 MED ORDER — AMPHETAMINE-DEXTROAMPHETAMINE 30 MG PO TABS: 30.0000 mg | ORAL_TABLET | Freq: Two times a day (BID) | ORAL | 0 refills | Status: DC

## 2019-12-19 MED ORDER — FLUOXETINE HCL 40 MG PO CAPS: 40.0000 mg | ORAL_CAPSULE | Freq: Every day | ORAL | 4 refills | Status: DC

## 2019-12-19 NOTE — Assessment & Plan Note (Signed)
Chronic, ongoing.  Has been on long term Adderall which offers benefit.  Continue current regimen and refill monthly.  Obtain UDS and contract next visit.  Refill sent today, to be filled 12/26/19, and PMP reviewed.

## 2019-12-19 NOTE — Assessment & Plan Note (Signed)
Referral to Connected Care and continue to collaborate with CCM team.

## 2019-12-19 NOTE — Progress Notes (Signed)
There were no vitals taken for this visit.   Subjective:    Patient ID: Courtney Davidson, female    DOB: 1975/09/01, 44 y.o.   MRN: [id number]  HPI: Courtney Davidson is a 44 y.o. female  Chief Complaint  Patient presents with  . Depression    . This visit was completed via MyChart due to the restrictions of the COVID-19 pandemic. All issues as above were discussed and addressed. Physical exam was done as above through visual confirmation on MyChart. If it was felt that the patient should be evaluated in the office, they were directed there. The patient verbally consented to this visit. . Location of the patient: home . Location of the provider: work . Those involved with this call:  . Provider: Marnee Guarneri, DNP . CMA: Yvonna Alanis, CMA . Front Desk/Registration: Don Perking  . Time spent on call: 20 minutes with patient face to face via video conference. More than 50% of this time was spent in counseling and coordination of care. 15 minutes total spent in review of patient's record and preparation of their chart.  . I verified patient identity using two factors (patient name and date of birth). Patient consents verbally to being seen via telemedicine visit today.    DEPRESSION Continues on Prozac and Adderall.  Is not taking Lithium due to cost.  Currently Adderall paid for out of pocket,  $33.  Adderall currently more for chronic fatigue syndrome, she has been on this for several years and does not wish to discontinue.  Reports improvement in fatigue with this medication. Lost job back in July 2020 and currently no insurance.  Denies SI/HI.  She was working with CCM team on assistance options, but she got frustrated with paperwork. Mood status: controlled Mood status: stable Satisfied with current treatment?: yes Symptom severity: moderate  Duration of current treatment : chronic Side effects: no Medication compliance: good compliance Psychotherapy/counseling:  none Depressed mood: yes Anxious mood: sometimes Anhedonia: no Significant weight loss or gain: no Insomnia: none Fatigue: yes Feelings of worthlessness or guilt: none Impaired concentration/indecisiveness: yes Suicidal ideations: no Hopelessness: no Crying spells: yes Depression screen [facility] 2/9 12/19/2019 07/29/2019 12/28/2018 11/16/2018 08/17/2018  Decreased Interest 0 2 1 0 3  Down, Depressed, Hopeless 0 3 1 0 0  PHQ - 2 Score 0 5 2 0 3  Altered sleeping 0 '3 1 2 ' -  Tired, decreased energy '2 2 3 2 ' -  Change in appetite 0 1 0 0 -  Feeling bad or failure about yourself  1 2 0 0 -  Trouble concentrating 0 2 0 0 -  Moving slowly or fidgety/restless 0 0 0 0 -  Suicidal thoughts 0 0 0 0 -  PHQ-9 Score '3 15 6 4 ' -  Difficult doing work/chores Somewhat difficult Very difficult Not difficult at all Somewhat difficult -  Some recent data might be hidden   GAD 7 : Generalized Anxiety Score 12/19/2019 07/29/2019 07/29/2019 12/28/2018  Nervous, Anxious, on Edge 0 2 3 0  Control/stop worrying 0 '2 2 1  ' Worry too much - different things 0 '2 2 1  ' Trouble relaxing 0 '2 2 1  ' Restless 0 0 1 1  Easily annoyed or irritable '3 1 3 2  ' Afraid - awful might happen 0 1 0 0  Total GAD 7 Score '3 10 13 6  ' Anxiety Difficulty Somewhat difficult Somewhat difficult Very difficult Not difficult at all    Relevant past medical, surgical, family  and social history reviewed and updated as indicated. Interim medical history since our last visit reviewed. Allergies and medications reviewed and updated.  Review of Systems  Constitutional: Negative for activity change, appetite change, diaphoresis, fatigue and fever.  Respiratory: Negative for cough, chest tightness and shortness of breath.   Cardiovascular: Negative for chest pain, palpitations and leg swelling.  Psychiatric/Behavioral: Positive for decreased concentration and sleep disturbance. Negative for self-injury and suicidal ideas. The patient is nervous/anxious.      Per HPI unless specifically indicated above     Objective:    There were no vitals taken for this visit.  Wt Readings from Last 3 Encounters:  07/29/19 158 lb (71.7 kg)  11/16/18 150 lb (68 kg)  08/27/18 150 lb 12.8 oz (68.4 kg)    Physical Exam Vitals and nursing note reviewed.  Constitutional:      General: She is awake. She is not in acute distress.    Appearance: She is well-developed. She is not ill-appearing.  HENT:     Head: Normocephalic.     Right Ear: Hearing normal.     Left Ear: Hearing normal.  Eyes:     General: Lids are normal.        Right eye: No discharge.        Left eye: No discharge.     Conjunctiva/sclera: Conjunctivae normal.  Pulmonary:     Effort: Pulmonary effort is normal. No accessory muscle usage or respiratory distress.  Musculoskeletal:     Cervical back: Normal range of motion.  Neurological:     Mental Status: She is alert and oriented to person, place, and time.  Psychiatric:        Attention and Perception: Attention normal.        Mood and Affect: Mood normal.        Behavior: Behavior normal. Behavior is cooperative.        Thought Content: Thought content normal.        Judgment: Judgment normal.     Results for orders placed or performed in visit on 11/24/18  Uric acid  Result Value Ref Range   Uric Acid 3.3 2.5 - 7.1 mg/dL  VITAMIN D 25 Hydroxy (Vit-D Deficiency, Fractures)  Result Value Ref Range   Vit D, 25-Hydroxy 22.8 (L) 30.0 - 100.0 ng/mL  Comprehensive metabolic panel  Result Value Ref Range   Glucose 91 65 - 99 mg/dL   BUN 11 6 - 24 mg/dL   Creatinine, Ser 0.85 0.57 - 1.00 mg/dL   GFR calc non Af Amer 84 >59 mL/min/1.73   GFR calc Af Amer 96 >59 mL/min/1.73   BUN/Creatinine Ratio 13 9 - 23   Sodium 140 134 - 144 mmol/L   Potassium 4.0 3.5 - 5.2 mmol/L   Chloride 102 96 - 106 mmol/L   CO2 26 20 - 29 mmol/L   Calcium 9.5 8.7 - 10.2 mg/dL   Total Protein 6.4 6.0 - 8.5 g/dL   Albumin 4.3 3.8 - 4.8 g/dL    Globulin, Total 2.1 1.5 - 4.5 g/dL   Albumin/Globulin Ratio 2.0 1.2 - 2.2   Bilirubin Total 0.3 0.0 - 1.2 mg/dL   Alkaline Phosphatase 46 39 - 117 IU/L   AST 17 0 - 40 IU/L   ALT 13 0 - 32 IU/L  CBC with Differential/Platelet  Result Value Ref Range   WBC 7.1 3.4 - 10.8 x10E3/uL   RBC 4.43 3.77 - 5.28 x10E6/uL   Hemoglobin 13.3 11.1 - 15.9  g/dL   Hematocrit 37.7 34.0 - 46.6 %   MCV 85 79 - 97 fL   MCH 30.0 26.6 - 33.0 pg   MCHC 35.3 31 - 35 g/dL   RDW 12.4 11.7 - 15.4 %   Platelets 338 150 - 450 x10E3/uL   Neutrophils 62 Not Estab. %   Lymphs 30 Not Estab. %   Monocytes 6 Not Estab. %   Eos 2 Not Estab. %   Basos 0 Not Estab. %   Neutrophils Absolute 4.4 1 - 7 x10E3/uL   Lymphocytes Absolute 2.1 0 - 3 x10E3/uL   Monocytes Absolute 0.4 0 - 0 x10E3/uL   EOS (ABSOLUTE) 0.2 0.0 - 0.4 x10E3/uL   Basophils Absolute 0.0 0 - 0 x10E3/uL   Immature Granulocytes 0 Not Estab. %   Immature Grans (Abs) 0.0 0.0 - 0.1 x10E3/uL  Thyroid Panel With TSH  Result Value Ref Range   TSH 0.894 0.450 - 4.500 uIU/mL   T4, Total 8.5 4.5 - 12.0 ug/dL   T3 Uptake Ratio 27 24 - 39 %   Free Thyroxine Index 2.3 1.2 - 4.9  C-reactive protein  Result Value Ref Range   CRP 1 0 - 10 mg/L  Sed Rate (ESR)  Result Value Ref Range   Sed Rate 4 0 - 32 mm/hr  ANA w/Reflex if Positive  Result Value Ref Range   Anti Nuclear Antibody (ANA) Negative Negative  Rheumatoid factor  Result Value Ref Range   Rhuematoid fact SerPl-aCnc <10.0 0.0 - 13.9 IU/mL  Lithium level  Result Value Ref Range   Lithium Lvl 0.2 (L) 0.6 - 1.2 mmol/L      Assessment & Plan:   Problem List Items Addressed This Visit      Nervous and Auditory   CFS (chronic fatigue syndrome)    Chronic, ongoing.  Has been on long term Adderall which offers benefit.  Continue current regimen and refill monthly.  Obtain UDS and contract next visit.  Refill sent today, to be filled 12/26/19, and PMP reviewed.        Other   Generalized anxiety  disorder    Chronic, ongoing.  Continue current medication regimen.  Prozac, which benefits patient mood.  She denies SI/HI.  Refills sent [facility].  GAD score improved from 10 to now 3.      Relevant Medications   FLUoxetine (PROZAC) 40 MG capsule   Depression, major, single episode, severe (HCC) - Primary    Chronic, ongoing.  Denies SI/HI.  Continue current medication regimen, Prozac, and adjust as needed.  She is not taking Lithium at this time due to cost, but scores have improved PHQ9 today is 3 and previous 15.  Connected Care Referral placed to determine if any assistance available to help with medication costs or possible Medicaid.  Continue to collaborate with CCM team      Relevant Medications   FLUoxetine (PROZAC) 40 MG capsule   Financial difficulties    Referral to Connected Care and continue to collaborate with CCM team.      Relevant Orders   Ambulatory referral to Connected Care      I discussed the assessment and treatment plan with the patient. The patient was provided an opportunity to ask questions and all were answered. The patient agreed with the plan and demonstrated an understanding of the instructions.   The patient was advised to call back or seek an in-person evaluation if the symptoms worsen or if the condition fails to  improve as anticipated.   I provided 21+ minutes of time during this encounter.   Controlled substance.  Checked data base and no other refills of controlled substances noted.  Last Adderall refill 11/25/19, with no other controlled substances noted on PMP.  Follow up plan: Return in about 3 months (around 03/20/2020) for MOOD.

## 2019-12-19 NOTE — Assessment & Plan Note (Addendum)
Chronic, ongoing.  Continue current medication regimen.  Prozac, which benefits patient mood.  She denies SI/HI.  Refills sent [facility].  GAD score improved from 10 to now 3.

## 2019-12-19 NOTE — Assessment & Plan Note (Signed)
Chronic, ongoing.  Denies SI/HI.  Continue current medication regimen, Prozac, and adjust as needed.  She is not taking Lithium at this time due to cost, but scores have improved PHQ9 today is 3 and previous 15.  Connected Care Referral placed to determine if any assistance available to help with medication costs or possible Medicaid.  Continue to collaborate with CCM team

## 2019-12-19 NOTE — Patient Instructions (Signed)
Living With Depression Everyone experiences occasional disappointment, sadness, and loss in their lives. When you are feeling down, blue, or sad for at least 2 weeks in a row, it may mean that you have depression. Depression can affect your thoughts and feelings, relationships, daily activities, and physical health. It is caused by changes in the way your brain functions. If you receive a diagnosis of depression, your health care provider will tell you which type of depression you have and what treatment options are available to you. If you are living with depression, there are ways to help you recover from it and also ways to prevent it from coming back. How to cope with lifestyle changes Coping with stress     Stress is your body's reaction to life changes and events, both good and bad. Stressful situations may include:  Getting married.  The death of a spouse.  Losing a job.  Retiring.  Having a baby. Stress can last just a few hours or it can be ongoing. Stress can play a major role in depression, so it is important to learn both how to cope with stress and how to think about it differently. Talk with your health care provider or a counselor if you would like to learn more about stress reduction. He or she may suggest some stress reduction techniques, such as:  Music therapy. This can include creating music or listening to music. Choose music that you enjoy and that inspires you.  Mindfulness-based meditation. This kind of meditation can be done while sitting or walking. It involves being aware of your normal breaths, rather than trying to control your breathing.  Centering prayer. This is a kind of meditation that involves focusing on a spiritual word or phrase. Choose a word, phrase, or sacred image that is meaningful to you and that brings you peace.  Deep breathing. To do this, expand your stomach and inhale slowly through your nose. Hold your breath for 3-5 seconds, then exhale  slowly, allowing your stomach muscles to relax.  Muscle relaxation. This involves intentionally tensing muscles then relaxing them. Choose a stress reduction technique that fits your lifestyle and personality. Stress reduction techniques take time and practice to develop. Set aside 5-15 minutes a day to do them. Therapists can offer training in these techniques. The training may be covered by some insurance plans. Other things you can do to manage stress include:  Keeping a stress diary. This can help you learn what triggers your stress and ways to control your response.  Understanding what your limits are and saying no to requests or events that lead to a schedule that is too full.  Thinking about how you respond to certain situations. You may not be able to control everything, but you can control how you react.  Adding humor to your life by watching funny films or TV shows.  Making time for activities that help you relax and not feeling guilty about spending your time this way.  Medicines Your health care provider may suggest certain medicines if he or she feels that they will help improve your condition. Avoid using alcohol and other substances that may prevent your medicines from working properly (may interact). It is also important to:  Talk with your pharmacist or health care provider about all the medicines that you take, their possible side effects, and what medicines are safe to take together.  Make it your goal to take part in all treatment decisions (shared decision-making). This includes giving input on   the side effects of medicines. It is best if shared decision-making with your health care provider is part of your total treatment plan. If your health care provider prescribes a medicine, you may not notice the full benefits of it for 4-8 weeks. Most people who are treated for depression need to be on medicine for at least 6-12 months after they feel better. If you are taking  medicines as part of your treatment, do not stop taking medicines without first talking to your health care provider. You may need to have the medicine slowly decreased (tapered) over time to decrease the risk of harmful side effects. Relationships Your health care provider may suggest family therapy along with individual therapy and drug therapy. While there may not be family problems that are causing you to feel depressed, it is still important to make sure your family learns as much as they can about your mental health. Having your family's support can help make your treatment successful. How to recognize changes in your condition Everyone has a different response to treatment for depression. Recovery from major depression happens when you have not had signs of major depression for two months. This may mean that you will start to:  Have more interest in doing activities.  Feel less hopeless than you did 2 months ago.  Have more energy.  Overeat less often, or have better or improving appetite.  Have better concentration. Your health care provider will work with you to decide the next steps in your recovery. It is also important to recognize when your condition is getting worse. Watch for these signs:  Having fatigue or low energy.  Eating too much or too little.  Sleeping too much or too little.  Feeling restless, agitated, or hopeless.  Having trouble concentrating or making decisions.  Having unexplained physical complaints.  Feeling irritable, angry, or aggressive. Get help as soon as you or your family members notice these symptoms coming back. How to get support and help from others How to talk with friends and family members about your condition  Talking to friends and family members about your condition can provide you with one way to get support and guidance. Reach out to trusted friends or family members, explain your symptoms to them, and let them know that you are  working with a health care provider to treat your depression. Financial resources Not all insurance plans cover mental health care, so it is important to check with your insurance carrier. If paying for co-pays or counseling services is a problem, search for a local or county mental health care center. They may be able to offer public mental health care services at low or no cost when you are not able to see a private health care provider. If you are taking medicine for depression, you may be able to get the generic form, which may be less expensive. Some makers of prescription medicines also offer help to patients who cannot afford the medicines they need. Follow these instructions at home:   Get the right amount and quality of sleep.  Cut down on using caffeine, tobacco, alcohol, and other potentially harmful substances.  Try to exercise, such as walking or lifting small weights.  Take over-the-counter and prescription medicines only as told by your health care provider.  Eat a healthy diet that includes plenty of vegetables, fruits, whole grains, low-fat dairy products, and lean protein. Do not eat a lot of foods that are high in solid fats, added sugars, or salt.    Keep all follow-up visits as told by your health care provider. This is important. Contact a health care provider if:  You stop taking your antidepressant medicines, and you have any of these symptoms: ? Nausea. ? Headache. ? Feeling lightheaded. ? Chills and body aches. ? Not being able to sleep (insomnia).  You or your friends and family think your depression is getting worse. Get help right away if:  You have thoughts of hurting yourself or others. If you ever feel like you may hurt yourself or others, or have thoughts about taking your own life, get help right away. You can go to your nearest emergency department or call:  Your local emergency services (911 in the [place]  A suicide crisis helpline, such as the  National Suicide Prevention Lifeline at 1-800-273-8255. This is open 24-hours a day. Summary  If you are living with depression, there are ways to help you recover from it and also ways to prevent it from coming back.  Work with your health care team to create a management plan that includes counseling, stress management techniques, and healthy lifestyle habits. This information is not intended to replace advice given to you by your health care provider. Make sure you discuss any questions you have with your health care provider. Document Revised: 10/08/2018 Document Reviewed: 05/19/2016 Elsevier Patient Education  2020 Elsevier Inc.  

## 2019-12-21 ENCOUNTER — Ambulatory Visit: Payer: Self-pay | Admitting: Pharmacist

## 2019-12-21 NOTE — Chronic Care Management (AMB) (Signed)
  Chronic Care Management   Note  12/21/2019 Name: Courtney Davidson MRN: [id number] DOB: Sep 20, 1975  Courtney Davidson is a 44 y.o. year old female who is a primary care patient of [name], [name], NP. The CCM team was consulted for assistance with chronic disease management and care coordination needs.    Attempted to call patient to review that she was approved for assistance from [facility]. Unable to LVM. Will try again within the next 1-2 business days  Catie Feliz Beam, PharmD, [facility] Clinical Pharmacist [facility] [phone]

## 2019-12-23 ENCOUNTER — Ambulatory Visit: Payer: Self-pay | Admitting: Pharmacist

## 2019-12-23 ENCOUNTER — Encounter: Payer: Self-pay | Admitting: Pharmacist

## 2019-12-23 ENCOUNTER — Telehealth: Payer: Self-pay

## 2019-12-23 NOTE — Chronic Care Management (AMB) (Signed)
  Chronic Care Management   Note  12/23/2019 Name: Courtney Davidson MRN: [id number] DOB: 02-05-1976  Courtney Davidson is a 44 y.o. year old female who is a primary care patient of [name], [name], NP. The CCM team was consulted for assistance with chronic disease management and care coordination needs.  Attempted to contact patient for medication access. Patient was approved for care from [facility], and they have been unable to get in touch with her as well. Unable to leave message on patient's voicemail. Left HIPAA compliant voicemail on her emergency contact line asking her to call us.   Will send MyChart message and collaborate w/ Care Guide to outreach for rescheduling.   Catie Feliz Beam, PharmD, [facility] Clinical Pharmacist [facility] [phone]

## 2019-12-26 ENCOUNTER — Telehealth: Payer: Self-pay | Admitting: Nurse Practitioner

## 2019-12-26 NOTE — Chronic Care Management (AMB) (Signed)
  Care Management   Note  12/26/2019 Name: DWANDA TUFANO MRN: [id number] DOB: Feb 24, 1976  [name] is a 44 y.o. year old female who is a primary care patient of Marjie Skiff, NP and is actively engaged with the care management team. I reached out to [name] by phone today to assist with scheduling a follow up visit with the Pharmacist.  Follow up plan: Unsuccessful telephone outreach attempt made not able to leave message no voicemail set up. The care management team will reach out to the patient again over the next 7 days. If patient returns call to provider office, please advise to call Embedded Care Management Care Guide Gwenevere Ghazi at [phone].  Gwenevere Ghazi  Care Guide, Embedded Care Coordination [facility]  [place], [place] [address] Direct Dial: [phone] [email or web address] Website: [email or web address]

## 2019-12-29 ENCOUNTER — Telehealth: Payer: Self-pay | Admitting: Pharmacy Technician

## 2019-12-29 NOTE — Telephone Encounter (Signed)
Patient failed to provide 2020 Federal Income Taxes.  I have attempted to reach patient multiple times.  Unable to reach patient.  Patient has not set-up the messaging system on her phone.  I have mailed patient making her aware that we still need her 57 Federal Income Taxes.  [facility] filled a 30 day supply of Fluoxetine & Lithium Carbonate on 12/22/19.  Patient has not picked-up.  Failure to provide the 2020 Federal Income Taxes will impede [facility]'s ability to provide ongoing medication assistance.  Sherilyn Dacosta Care Manager Medication

## 2019-12-30 NOTE — Chronic Care Management (AMB) (Signed)
°  Care Management   Note  12/30/2019 Name: Courtney Davidson MRN: [id number] DOB: January 14, 1976  Courtney Davidson is a 44 y.o. year old female who is a primary care patient of Marjie Skiff, NP and is actively engaged with the care management team. I reached out to Courtney Davidson by phone today to assist with scheduling a follow up visit with the Pharmacist. Also made patient aware that Pharmasict Catie wanted me to make her aware she has been approved for Medication Management Clinic at [facility]. Patient understood and Thanked me for the call.     Follow up plan: Telephone appointment with care management team member scheduled for: 02/22/2020  [facility] Guide, Embedded Care Coordination [facility]  [place], [place] [address] Direct Dial: [phone] [email or web address]: [email or web address]

## 2020-01-03 ENCOUNTER — Other Ambulatory Visit: Payer: Self-pay | Admitting: Nurse Practitioner

## 2020-01-03 NOTE — Telephone Encounter (Signed)
Requested medication (s) are due for refill today: no  Requested medication (s) are on the active medication list: yes  Last refill:  12/26/2019  Future visit scheduled: yes  Notes to clinic: this refill cannot be delegated    Requested Prescriptions  Pending Prescriptions Disp Refills   amphetamine-dextroamphetamine (ADDERALL) 30 MG tablet 60 tablet 0    Sig: Take 1 tablet by mouth 2 (two) times daily.      Not Delegated - Psychiatry:  Stimulants/ADHD Failed - 01/03/2020  8:50 AM      Failed - This refill cannot be delegated      Failed - Urine Drug Screen completed in last 360 days.      Passed - Valid encounter within last 3 months    Recent Outpatient Visits           2 weeks ago Depression, major, single episode, severe (HCC)   [facility] [name], [name], NP   5 months ago Depression, major, single episode, severe (HCC)   [facility] [name], [name], NP   1 year ago Pulmonary nodules   [facility] [place], [name], NP   1 year ago Chronic pain of both knees   [facility] [place], Logan T, NP   1 year ago Depression, major, single episode, severe (HCC)   [facility] [name], [name], NP       Future Appointments             In 2 months [name], [name], NP [facility], [facility]

## 2020-01-03 NOTE — Telephone Encounter (Signed)
RX REFILL amphetamine-dextroamphetamine (ADDERALL) 30 MG tablet [[id number]]  PHARMACY [facility] [address] [place], [address]

## 2020-01-03 NOTE — Telephone Encounter (Signed)
Just sent in 12/26/19, not due to be refilled yet

## 2020-01-11 ENCOUNTER — Telehealth: Payer: Self-pay

## 2020-01-26 ENCOUNTER — Telehealth: Payer: Self-pay

## 2020-01-26 NOTE — Chronic Care Management (AMB) (Signed)
  Care Management   Note  01/26/2020 Name: [name] MRN: [id number] DOB: 1975/08/31  Courtney Davidson is a 44 y.o. year old female who is a primary care patient of Marjie Skiff, NP and is actively engaged with the care management team. I reached out to Courtney Davidson by phone today to assist with re-scheduling a follow up visit with the Pharmacist  Follow up plan: Unsuccessful telephone outreach attempt made. The care management team will reach out to the patient again over the next 7 days.  If patient returns call to provider office, please advise to call Embedded Care Management Care Guide Penne Lash  at [phone]  Penne Lash, RMA Care Guide, Embedded Care Coordination [facility]  [place], [place] [address] Direct Dial: [phone] [email or web address]: [email or web address]

## 2020-01-30 ENCOUNTER — Other Ambulatory Visit: Payer: Self-pay

## 2020-01-30 NOTE — Chronic Care Management (AMB) (Signed)
°  Care Management   Note  01/30/2020 Name: Courtney Davidson MRN: [id number] DOB: [birth year]  Courtney Davidson is a 44 y.o. year old female who is a primary care patient of Marjie Skiff, NP and is actively engaged with the care management team. I reached out to Courtney Davidson by phone today to assist with re-scheduling a follow up visit with the Pharmacist  Follow up plan: Second Unsuccessful telephone outreach attempt made. The care management team will reach out to the patient again over the next 7 days.  If patient returns call to provider office, please advise to call Embedded Care Management Care Guide Penne Lash  at [phone]  Penne Lash, RMA Care Guide, Embedded Care Coordination [facility]  [place], [place] [address] Direct Dial: [phone] [email or web address] Website: [email or web address]

## 2020-01-30 NOTE — Telephone Encounter (Signed)
LOV: 12/19/19 Next Appt: 03/20/20  Last filled 12/26/2019

## 2020-01-31 MED ORDER — AMPHETAMINE-DEXTROAMPHETAMINE 30 MG PO TABS: 30.0000 mg | ORAL_TABLET | Freq: Two times a day (BID) | ORAL | 0 refills | Status: DC

## 2020-01-31 NOTE — Telephone Encounter (Signed)
Pt called to check the status of the refill, she stated she got a call yesterday that it had been called in but per pharmacy it is not there.   Best [id number] [phone] Pharmacy - [facility] [address]

## 2020-01-31 NOTE — Telephone Encounter (Signed)
Routing to provider  

## 2020-02-02 NOTE — Chronic Care Management (AMB) (Signed)
°  Care Management   Note  02/02/2020 Name: JAMILYA SARRAZIN MRN: [id number] DOB: [birth year]  [name] is a 44 y.o. year old female who is a primary care patient of Marjie Skiff, NP and is actively engaged with the care management team. I reached out to [name] by phone today to assist with re-scheduling a follow up visit with the Pharmacist  Follow up plan: Telephone appointment with care management team member scheduled for:03/09/2020  Penne Lash, RMA Care Guide, Embedded Care Coordination [facility]  [place], [place] [address] Direct Dial: [phone] [email or web address] Website: [email or web address]

## 2020-02-06 NOTE — Telephone Encounter (Signed)
Hi Jolene, The care guides have had three unssuccessful attempts at scheduling a pharmacy follow up for this lady.  She has been approved for medication assistance at [facility] but failed to provide requested documentation. I would recommend she call the dispensing pharmacy and ask if they can order from a different manufacturer since any other option will likely be cost prohibitive for her.

## 2020-02-20 ENCOUNTER — Telehealth: Payer: Self-pay | Admitting: Nurse Practitioner

## 2020-02-20 NOTE — Telephone Encounter (Signed)
° °  SF 02/20/2020    Name: Courtney Davidson    MRN: [id number]    DOB: Nov 07, 1975    AGE: 44 y.o.    GENDER: female    PCP Marjie Skiff, NP.   Called pt regarding Community Resource Referral for assistance with Medicaid. Patient stated that she is still in need. Informed patient the the quickest and most efficient way to apply for Medicaid would be to give [facility] DSS a call at [phone]. Also to let them know she wants to apply for Medicaid and they will take her information and connect her with a caseworker that will further assist her. Patient stated that will look up the information for [facility] and give them a call. Patient stated no additional needs at this time.   Follow up on: 02/22/2020  [facility] Guide, Embedded Care Coordination [facility], Care Management Phone: [phone] Email: [email or web address]

## 2020-02-21 NOTE — Telephone Encounter (Signed)
   SF 02/21/2020   Name: Courtney Davidson   MRN: [id number]   DOB: April 26, 1976   AGE: 44 y.o.   GENDER: female   PCP Marjie Skiff, NP.   Called pt regarding Publishing rights manager Referral for [employer]. Patient did not answer. [name] stated on 02/20/20 that she will call [facility] DSS to apply for Medicaid. Patient can give office a call if she has any additional needs.   Closing referral pending any other needs of patient.    [facility] Care Guide, Embedded Care Coordination [facility], Care Management Phone: [phone] Email: [email or web address]

## 2020-02-22 ENCOUNTER — Telehealth: Payer: Self-pay

## 2020-02-29 ENCOUNTER — Other Ambulatory Visit: Payer: Self-pay | Admitting: Nurse Practitioner

## 2020-02-29 MED ORDER — AMPHETAMINE-DEXTROAMPHETAMINE 30 MG PO TABS: 30.0000 mg | ORAL_TABLET | Freq: Two times a day (BID) | ORAL | 0 refills | Status: DC

## 2020-03-09 ENCOUNTER — Telehealth: Payer: Self-pay | Admitting: Pharmacist

## 2020-03-09 ENCOUNTER — Telehealth: Payer: Self-pay

## 2020-03-09 NOTE — Progress Notes (Signed)
  Chronic Care Management   Outreach Note  03/09/2020 Name: Courtney Davidson MRN: [id number] DOB: Dec 28, 1975  Referred by: Marjie Skiff, NP Reason for referral : No chief complaint on file.   An unsuccessful telephone outreach was attempted today. The patient was referred to the pharmacist for assistance with care management and care coordination. Unable to leave message as patient's mailbox has not been set up.  Will ask care guide to outreach for reschedule in 6-8 weeks.  Mercer Pod. Tiburcio Pea PharmD, BCPS Clinical Pharmacist [facility] [phone]

## 2020-03-16 ENCOUNTER — Telehealth: Payer: Self-pay

## 2020-03-19 ENCOUNTER — Telehealth: Payer: Self-pay | Admitting: Licensed Clinical Social Worker

## 2020-03-19 NOTE — Telephone Encounter (Signed)
  Chronic Care Management    Clinical Social Work General Follow Up Note  03/19/2020 Name: Courtney Davidson MRN: [id number] DOB: June 13, 1976  Courtney Davidson is a 44 y.o. year old female who is a primary care patient of [name], [name], NP. The CCM team was consulted for assistance with [facility] .   Review of patient status, including review of consultants reports, relevant laboratory and other test results, and collaboration with appropriate care team members and the patient's provider was performed as part of comprehensive patient evaluation and provision of chronic care management services.    LCSW completed CCM outreach attempt today but was unable to reach patient successfully. A HIPPA compliant voice message was left encouraging patient to return call once available. LCSW will ask Scheduling Care Guide to reschedule CCM SW appointment with patient as well.   Outpatient Encounter Medications as of 03/19/2020  Medication Sig  . amphetamine-dextroamphetamine (ADDERALL) 30 MG tablet Take 1 tablet by mouth 2 (two) times daily.  . diclofenac Sodium (VOLTAREN) 1 % GEL Apply 2 g topically 3 (three) times daily.  Marland Kitchen FLUoxetine (PROZAC) 40 MG capsule Take 1 capsule (40 mg total) by mouth daily.  Marland Kitchen lithium 300 MG tablet Take 1 tablet (300 mg total) by mouth 2 (two) times daily. (Patient not taking: Reported on 12/19/2019)  . naproxen sodium (ALEVE) 220 MG tablet Take 220 mg by mouth 2 (two) times daily as needed.   No facility-administered encounter medications on file as of 03/19/2020.   Follow Up Plan: Scheduling Care Guide will reach out to patient to reschedule appointment.   Dickie La, BSW, MSW, LCSW [facility] Care Management [place]  [facility] [email or web address] Phone: [phone]

## 2020-03-20 ENCOUNTER — Ambulatory Visit: Payer: Self-pay | Admitting: Nurse Practitioner

## 2020-04-01 ENCOUNTER — Other Ambulatory Visit: Payer: Self-pay

## 2020-04-02 MED ORDER — AMPHETAMINE-DEXTROAMPHETAMINE 30 MG PO TABS: 30.0000 mg | ORAL_TABLET | Freq: Two times a day (BID) | ORAL | 0 refills | Status: DC

## 2020-04-17 ENCOUNTER — Ambulatory Visit (INDEPENDENT_AMBULATORY_CARE_PROVIDER_SITE_OTHER): Payer: Self-pay | Admitting: Nurse Practitioner

## 2020-04-17 ENCOUNTER — Other Ambulatory Visit: Payer: Self-pay

## 2020-04-17 ENCOUNTER — Encounter: Payer: Self-pay | Admitting: Nurse Practitioner

## 2020-04-17 VITALS — BP 115/74 | HR 89 | Temp 98.4°F | Resp 16 | Ht 62.0 in | Wt 171.8 lb

## 2020-04-17 DIAGNOSIS — G9332 Myalgic encephalomyelitis/chronic fatigue syndrome: Secondary | ICD-10-CM

## 2020-04-17 DIAGNOSIS — F322 Major depressive disorder, single episode, severe without psychotic features: Secondary | ICD-10-CM

## 2020-04-17 DIAGNOSIS — N920 Excessive and frequent menstruation with regular cycle: Secondary | ICD-10-CM

## 2020-04-17 DIAGNOSIS — Z599 Problem related to housing and economic circumstances, unspecified: Secondary | ICD-10-CM

## 2020-04-17 DIAGNOSIS — F411 Generalized anxiety disorder: Secondary | ICD-10-CM

## 2020-04-17 DIAGNOSIS — R5382 Chronic fatigue, unspecified: Secondary | ICD-10-CM

## 2020-04-17 MED ORDER — AMPHETAMINE-DEXTROAMPHETAMINE 30 MG PO TABS: 30.0000 mg | ORAL_TABLET | Freq: Two times a day (BID) | ORAL | 0 refills | Status: DC

## 2020-04-17 NOTE — Assessment & Plan Note (Signed)
Chronic, ongoing.  Has been on long term Adderall which offers benefit.  Continue current regimen and refill monthly.  Obtain UDS and contract next visit.  Refill sent today, to be filled 05/03/20, and PDMP reviewed.

## 2020-04-17 NOTE — Assessment & Plan Note (Signed)
Chronic, ongoing.  Continue current medication regimen.  Prozac, which benefits patient mood.  She denies SI/HI.  GAD score 3. ?

## 2020-04-17 NOTE — Patient Instructions (Signed)

## 2020-04-17 NOTE — Assessment & Plan Note (Signed)
Continue to collaborate with CCM team as needed, at this time she denies any needs.

## 2020-04-17 NOTE — Assessment & Plan Note (Signed)
Currently with regular cycle.  She declines pap smear at this time, although recommended and declines labs.  Recommend to return to office if any worsening or ongoing symptoms, then will perform pap and obtain labs.

## 2020-04-17 NOTE — Progress Notes (Signed)
BP 115/74 (BP Location: Left Arm, Patient Position: Sitting, Cuff Size: Normal)   Pulse 89   Temp 98.4 F (36.9 C) (Oral)   Resp 16   Ht _0  (1.575 m)   Wt 171 lb 12.8 oz (77.9 kg)   SpO2 97%   BMI 31.42 kg/m    Subjective:    Patient ID: Courtney Davidson, female    DOB: July 20, 1975, 44 y.o.   MRN: [id number]  HPI: Courtney Davidson is a 44 y.o. female  Chief Complaint  Patient presents with  . Anxiety  . Depression    DEPRESSION Continues on Prozac and Adderall  Currently Adderall paid for out of pocket,  $33 and $10 for Prozac.  Adderall currently more for chronic fatigue syndrome, she has been on this for several years and does not wish to discontinue.  Lost job back in July 2020 and currently no insurance.  Denies SI/HI.   Mood status: controlled Mood status: stable Satisfied with current treatment?: yes Symptom severity: moderate  Duration of current treatment : chronic Side effects: no Medication compliance: good compliance Psychotherapy/counseling: none Depressed mood: yes Anxious mood: sometimes Anhedonia: no Significant weight loss or gain: no Insomnia: yes hard to fall asleep Fatigue: yes Feelings of worthlessness or guilt: yes Impaired concentration/indecisiveness: yes Suicidal ideations: no Hopelessness: no Crying spells: yes Depression screen [facility] 2/9 04/17/2020 12/19/2019 07/29/2019 12/28/2018 11/16/2018  Decreased Interest 0 0 2 1 0  Down, Depressed, Hopeless 0 0 3 1 0  PHQ - 2 Score 0 0 5 2 0  Altered sleeping 2 0 _1 Tired, decreased energy _2 Change in appetite 0 0 1 0 0  Feeling bad or failure about yourself  0 1 2 0 0  Trouble concentrating 0 0 2 0 0  Moving slowly or fidgety/restless 0 0 0 0 0  Suicidal thoughts 0 0 0 0 0  PHQ-9 Score _3 Difficult doing work/chores Somewhat difficult Somewhat difficult Very difficult Not difficult at all Somewhat difficult  Some recent data might be hidden   GAD 7 : Generalized  Anxiety Score 04/17/2020 12/19/2019 07/29/2019 07/29/2019  Nervous, Anxious, on Edge 0 0 2 3  Control/stop worrying 0 0 2 2  Worry too much - different things 0 0 2 2  Trouble relaxing 0 0 2 2  Restless 0 0 0 1  Easily annoyed or irritable _4 Afraid - awful might happen 0 0 1 0  Total GAD 7 Score _5 Anxiety Difficulty Somewhat difficult Somewhat difficult Somewhat difficult Very difficult   MENORRHAGIA She reports her menstrual cycles have become irregular -- will have heavy flow and then no cycle for 2 weeks, then heavy flow -- noticed it in May and June -- since then it has been regular monthly with heavier flow.  She is not sure when her mother went through menopause, had hysterectomy and is not sure why.  Used tampons -- currently uses about 36 tampons a cycle.   Hot flashes: no Night sweats: no Sleep disturbances: no Vaginal dryness: no Dyspareunia:no Decreased libido: no Emotional lability: yes Stress incontinence: no Previous HRT/pharmacotherapy: no Hysterectomy: no  Relevant past medical, surgical, family and social history reviewed and updated as indicated. Interim medical history since our last visit reviewed. Allergies and medications reviewed and updated.  Review of Systems  Constitutional: Positive for fatigue. Negative for activity change, appetite change,  diaphoresis and fever.  Respiratory: Negative for cough, chest tightness and shortness of breath.   Cardiovascular: Negative for chest pain, palpitations and leg swelling.  Psychiatric/Behavioral: Positive for decreased concentration and sleep disturbance. Negative for self-injury and suicidal ideas. The patient is nervous/anxious.     Per HPI unless specifically indicated above     Objective:    BP 115/74 (BP Location: Left Arm, Patient Position: Sitting, Cuff Size: Normal)   Pulse 89   Temp 98.4 F (36.9 C) (Oral)   Resp 16   Ht _0  (1.575 m)   Wt 171 lb 12.8 oz (77.9 kg)   SpO2 97%   BMI  31.42 kg/m   Wt Readings from Last 3 Encounters:  04/17/20 171 lb 12.8 oz (77.9 kg)  07/29/19 158 lb (71.7 kg)  11/16/18 150 lb (68 kg)    Physical Exam Vitals and nursing note reviewed.  Constitutional:      General: She is awake. She is not in acute distress.    Appearance: She is well-developed. She is not ill-appearing.  HENT:     Head: Normocephalic.     Right Ear: Hearing normal.     Left Ear: Hearing normal.  Eyes:     General: Lids are normal.        Right eye: No discharge.        Left eye: No discharge.     Conjunctiva/sclera: Conjunctivae normal.     Pupils: Pupils are equal, round, and reactive to light.  Neck:     Vascular: No carotid bruit.  Cardiovascular:     Rate and Rhythm: Normal rate and regular rhythm.     Heart sounds: Normal heart sounds. No murmur heard.  No gallop.   Pulmonary:     Effort: Pulmonary effort is normal. No accessory muscle usage or respiratory distress.     Breath sounds: Normal breath sounds.  Abdominal:     General: Bowel sounds are normal.     Palpations: Abdomen is soft.  Musculoskeletal:     Cervical back: Normal range of motion and neck supple.     Right lower leg: No edema.     Left lower leg: No edema.  Skin:    General: Skin is warm and dry.  Neurological:     Mental Status: She is alert and oriented to person, place, and time.  Psychiatric:        Attention and Perception: Attention normal.        Mood and Affect: Mood is depressed. Affect is tearful.        Behavior: Behavior normal. Behavior is cooperative.        Thought Content: Thought content normal.        Judgment: Judgment normal.     Results for orders placed or performed in visit on 11/24/18  Uric acid  Result Value Ref Range   Uric Acid 3.3 2.5 - 7.1 mg/dL  VITAMIN D 25 Hydroxy (Vit-D Deficiency, Fractures)  Result Value Ref Range   Vit D, 25-Hydroxy 22.8 (L) 30.0 - 100.0 ng/mL  Comprehensive metabolic panel  Result Value Ref Range   Glucose 91 65  - 99 mg/dL   BUN 11 6 - 24 mg/dL   Creatinine, Ser 0.85 0.57 - 1.00 mg/dL   GFR calc non Af Amer 84 >59 mL/min/1.73   GFR calc Af Amer 96 >59 mL/min/1.73   BUN/Creatinine Ratio 13 9 - 23   Sodium 140 134 - 144 mmol/L   Potassium 4.0  3.5 - 5.2 mmol/L   Chloride 102 96 - 106 mmol/L   CO2 26 20 - 29 mmol/L   Calcium 9.5 8.7 - 10.2 mg/dL   Total Protein 6.4 6.0 - 8.5 g/dL   Albumin 4.3 3.8 - 4.8 g/dL   Globulin, Total 2.1 1.5 - 4.5 g/dL   Albumin/Globulin Ratio 2.0 1.2 - 2.2   Bilirubin Total 0.3 0.0 - 1.2 mg/dL   Alkaline Phosphatase 46 39 - 117 IU/L   AST 17 0 - 40 IU/L   ALT 13 0 - 32 IU/L  CBC with Differential/Platelet  Result Value Ref Range   WBC 7.1 3.4 - 10.8 x10E3/uL   RBC 4.43 3.77 - 5.28 x10E6/uL   Hemoglobin 13.3 11.1 - 15.9 g/dL   Hematocrit 37.7 34.0 - 46.6 %   MCV 85 79 - 97 fL   MCH 30.0 26.6 - 33.0 pg   MCHC 35.3 31 - 35 g/dL   RDW 12.4 11.7 - 15.4 %   Platelets 338 150 - 450 x10E3/uL   Neutrophils 62 Not Estab. %   Lymphs 30 Not Estab. %   Monocytes 6 Not Estab. %   Eos 2 Not Estab. %   Basos 0 Not Estab. %   Neutrophils Absolute 4.4 1.40 - 7.00 x10E3/uL   Lymphocytes Absolute 2.1 0 - 3 x10E3/uL   Monocytes Absolute 0.4 0 - 0 x10E3/uL   EOS (ABSOLUTE) 0.2 0.0 - 0.4 x10E3/uL   Basophils Absolute 0.0 0 - 0 x10E3/uL   Immature Granulocytes 0 Not Estab. %   Immature Grans (Abs) 0.0 0.0 - 0.1 x10E3/uL  Thyroid Panel With TSH  Result Value Ref Range   TSH 0.894 0.450 - 4.500 uIU/mL   T4, Total 8.5 4.5 - 12.0 ug/dL   T3 Uptake Ratio 27 24 - 39 %   Davidson Thyroxine Index 2.3 1.2 - 4.9  C-reactive protein  Result Value Ref Range   CRP 1 0 - 10 mg/L  Sed Rate (ESR)  Result Value Ref Range   Sed Rate 4 0 - 32 mm/hr  ANA w/Reflex if Positive  Result Value Ref Range   Anti Nuclear Antibody (ANA) Negative Negative  Rheumatoid factor  Result Value Ref Range   Rhuematoid fact SerPl-aCnc <10.0 0.0 - 13.9 IU/mL  Lithium level  Result Value Ref Range   Lithium  Lvl 0.2 (L) 0.6 - 1.2 mmol/L      Assessment & Plan:   Problem List Items Addressed This Visit      Nervous and Auditory   CFS (chronic fatigue syndrome)    Chronic, ongoing.  Has been on long term Adderall which offers benefit.  Continue current regimen and refill monthly.  Obtain UDS and contract next visit.  Refill sent today, to be filled 05/03/20, and PDMP reviewed.        Other   Generalized anxiety disorder    Chronic, ongoing.  Continue current medication regimen.  Prozac, which benefits patient mood.  She denies SI/HI.  GAD score 3.      Depression, major, single episode, severe ([place]) - Primary    Chronic, ongoing.  Denies SI/HI.  Continue current medication regimen, Prozac, and adjust as needed.  She is not taking Lithium at this time due to cost, but scores have improved PHQ9 today is 5.  Continue to collaborate with CCM team as needed.  At this time she denies any needs.  Return in 3 months.      Financial difficulties  Continue to collaborate with CCM team as needed, at this time she denies any needs.      Menorrhagia    Currently with regular cycle.  She declines pap smear at this time, although recommended and declines labs.  Recommend to return to office if any worsening or ongoing symptoms, then will perform pap and obtain labs.           Controlled substance.  Checked data base and no other refills of controlled substances noted.  Last Adderall refill 04/02/2020, with no other controlled substances noted on PDMP.  Follow up plan: Return in about 3 months (around 07/18/2020) for ANXIETY AND MOOD.

## 2020-04-17 NOTE — Assessment & Plan Note (Signed)
Chronic, ongoing.  Denies SI/HI.  Continue current medication regimen, Prozac, and adjust as needed.  She is not taking Lithium at this time due to cost, but scores have improved PHQ9 today is 5.  Continue to collaborate with CCM team as needed.  At this time she denies any needs.  Return in 3 months.

## 2020-04-18 IMAGING — CT CT CHEST W/O CM
2 of 4 series · 15 of 36 positions shown, 18 images · non-contrast
Comparison: CT scan of February 03, 2017.

CLINICAL DATA: Pulmonary nodules.

EXAM:
CT CHEST WITHOUT CONTRAST
TECHNIQUE: Multidetector CT imaging of the chest was performed following the
standard protocol without IV contrast.

[Series 2: thorax · axial · 0.59mm/px · z∈[-259,+5]mm · 12 of 155 slices shown, 15 images]
[im 12/155  mediastinal]
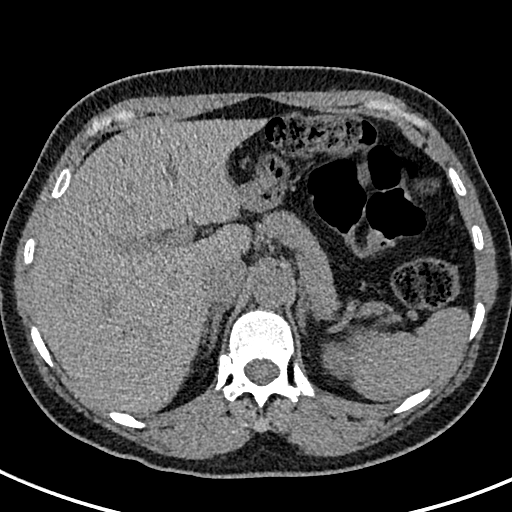
[im 12/155  lung]
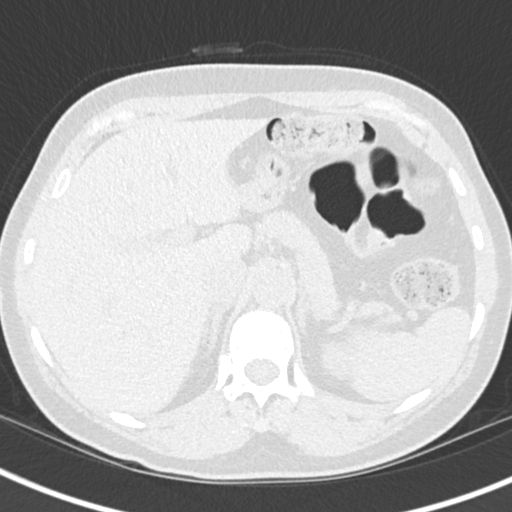
[im 23/155  lung]
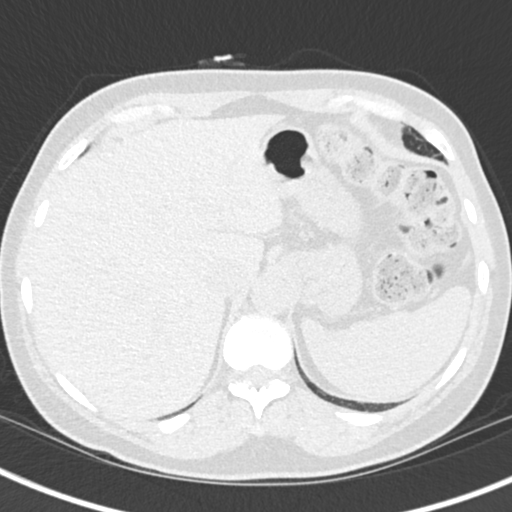
[im 34/155  lung]
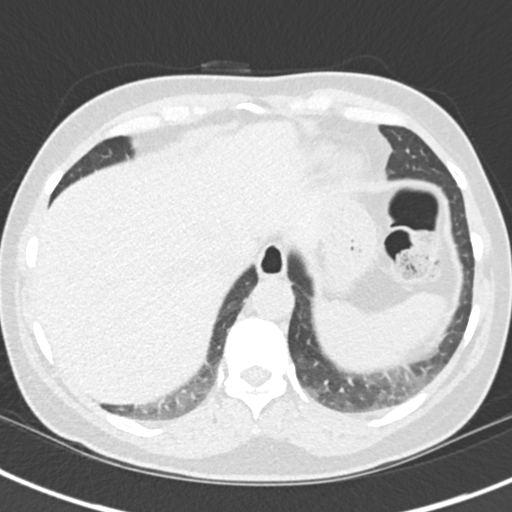
[im 45/155  lung]
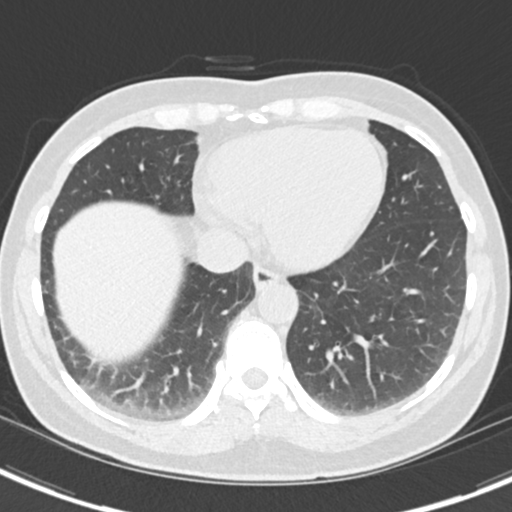
[im 56/155  mediastinal]
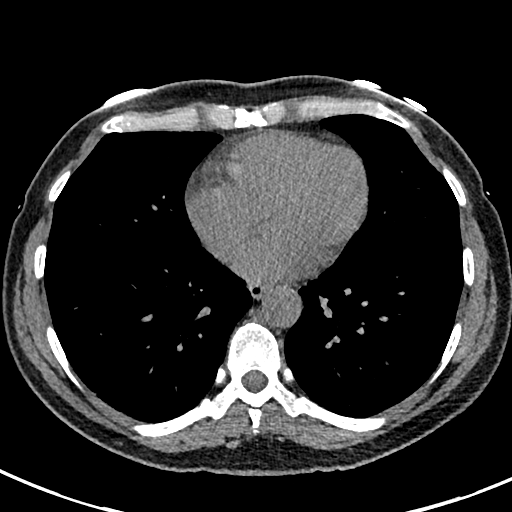
[im 56/155  lung]
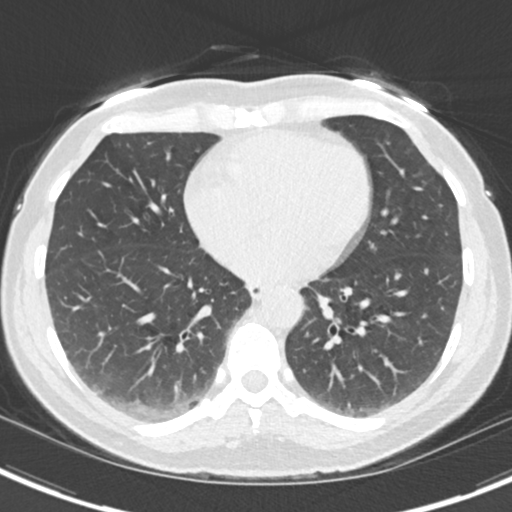
[im 67/155  lung]
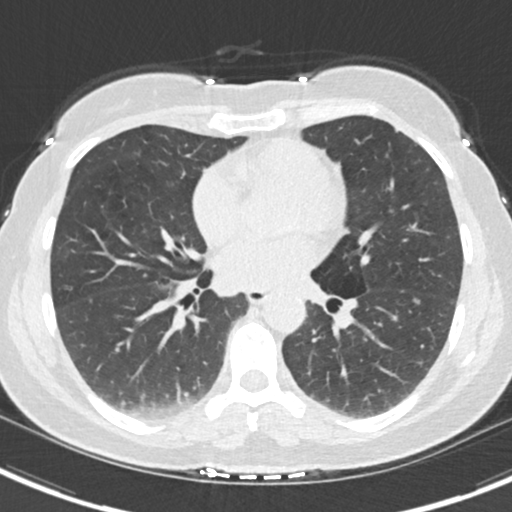
[im 89/155  lung]
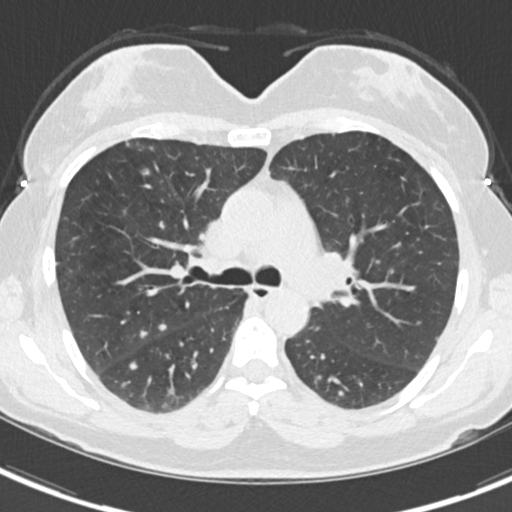
[im 100/155  lung]
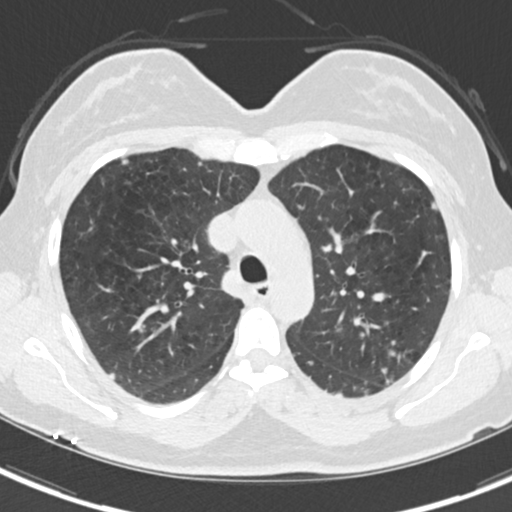
[im 111/155  mediastinal]
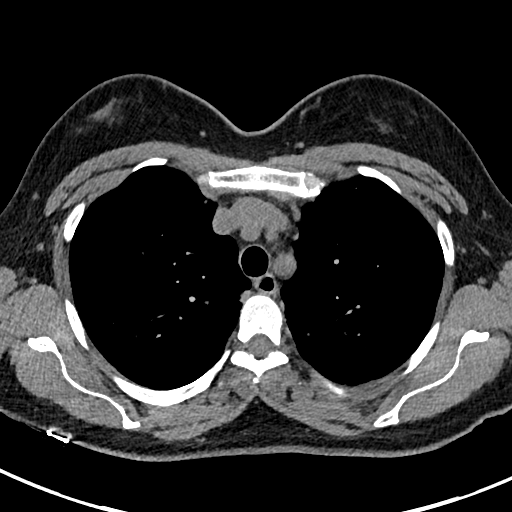
[im 111/155  lung]
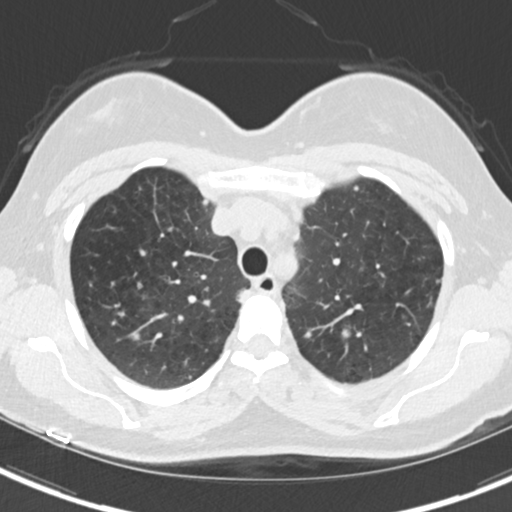
[im 122/155  lung]
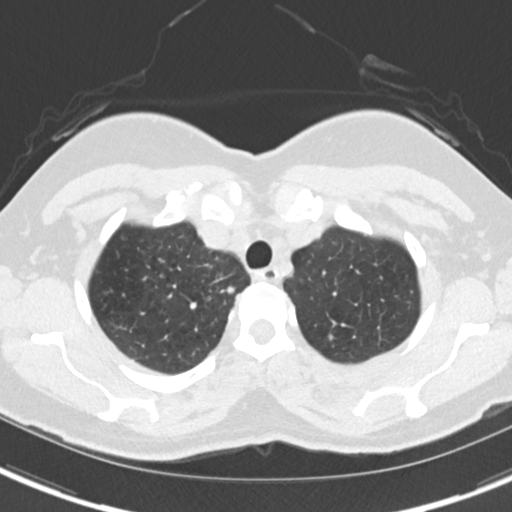
[im 133/155  lung]
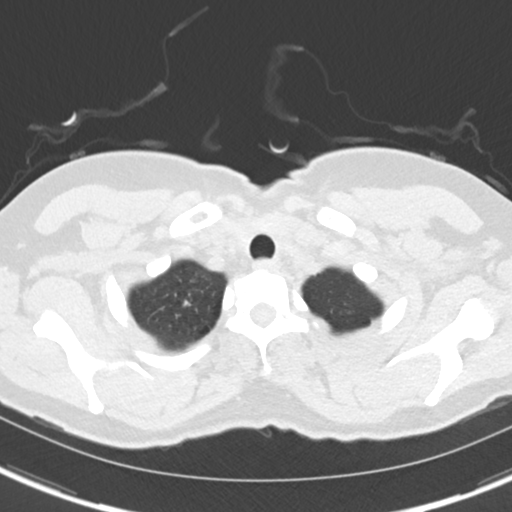
[im 144/155  lung]
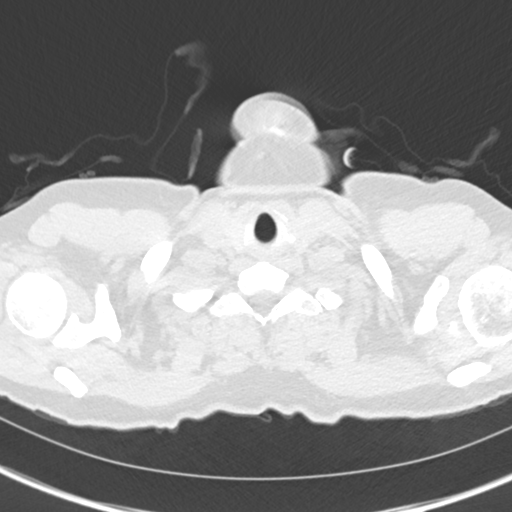

[Series 5: coronal · coronal · 0.62mm/px · 3 of 108 slices shown]
[im 22/108  lung]
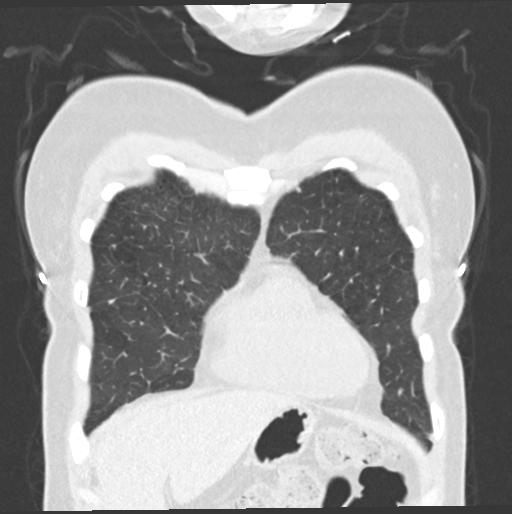
[im 43/108  lung]
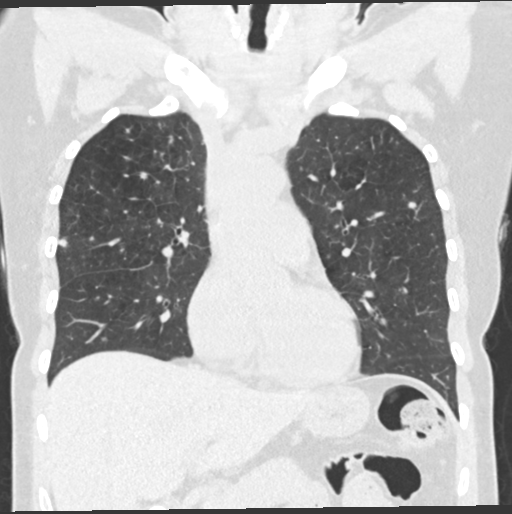
[im 65/108  lung]
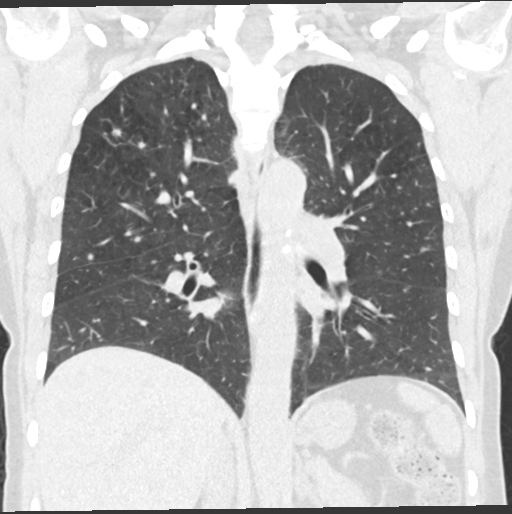

[15 of 36 positions shown; findings below may reference images not displayed]

FINDINGS: Cardiovascular: Atherosclerosis of thoracic aorta is noted without
aneurysm formation. Normal cardiac size. No pericardial effusion.

Mediastinum/Nodes: No enlarged mediastinal or axillary lymph nodes.
Thyroid gland, trachea, and esophagus demonstrate no significant
findings.

Lungs/Pleura: No pneumothorax or pleural effusion is noted.
Emphysematous disease is noted in the upper lobes bilaterally. There
is a significantly increased number of pulmonary nodule seen in both
lungs. The largest is a spiculated 7 mm nodule in the right upper
lobe best seen on image number 40 of series 3. 4 mm nodule is noted
posteriorly in the left lower lobe best seen on image number 114 of
series 3; this is not significantly changed compared to prior exam.

Upper Abdomen: Solitary gallstone is again noted.

Musculoskeletal: No chest wall mass or suspicious bone lesions
identified.
IMPRESSION: There is interval development of multiple nodules throughout both
lungs, the largest being 7 mm spiculated nodule in right upper lobe.
This is highly concerning for metastatic disease. These results will
be called to the ordering clinician or representative by the
Radiologist Assistant, and communication documented in the PACS or
zVision Dashboard.

Aortic Atherosclerosis (QMCIL-O0G.G) and Emphysema (QMCIL-8D8.U).

## 2020-06-01 ENCOUNTER — Other Ambulatory Visit: Payer: Self-pay

## 2020-06-01 MED ORDER — AMPHETAMINE-DEXTROAMPHETAMINE 30 MG PO TABS: 30.0000 mg | ORAL_TABLET | Freq: Two times a day (BID) | ORAL | 0 refills | Status: DC

## 2020-06-01 NOTE — Telephone Encounter (Signed)
Routing to provider  

## 2020-06-01 NOTE — Telephone Encounter (Signed)
PDMP review, last fill 05/06/20

## 2020-07-04 ENCOUNTER — Other Ambulatory Visit: Payer: Self-pay

## 2020-07-04 MED ORDER — AMPHETAMINE-DEXTROAMPHETAMINE 30 MG PO TABS: 30.0000 mg | ORAL_TABLET | Freq: Two times a day (BID) | ORAL | 0 refills | Status: DC

## 2020-07-10 ENCOUNTER — Telehealth: Payer: Self-pay | Admitting: Pharmacy Technician

## 2020-07-10 NOTE — Telephone Encounter (Signed)
Patient failed to provide 2021 proof of income.  Still needs to provide 2020 Federal Tax Return.  No additional medication assistance will be provided by [facility] without the required proof of income documentation.  Patient notified by letter.  Sherilyn Dacosta Care Manager [facility]   [address] [place], [place]  [address]     This is to inform you that you are no longer eligible to receive medication assistance at [facility].  The reason(s) are:    _____Your total gross monthly household income exceeds 250% of the Federal Poverty Level.   _____Tangible assets (savings, checking, stocks/bonds, pension, retirement, etc.) exceeds our limit  _____You are eligible to receive benefits from [facility], [facility] or HIV Medication              Assistance Program _____You are eligible to receive benefits from a Medicare Part "D" plan _____You have prescription insurance  _____You are not an [facility] resident __X__Failure to provide all requested proof of income information for 2021. Still need to provide 2020 Federal Tax Return.   Medication assistance will resume once all requested financial information has been returned to our clinic.  If you have questions, please contact our clinic at [phone].    Thank you,  [facility]

## 2020-07-12 ENCOUNTER — Other Ambulatory Visit: Payer: Self-pay

## 2020-07-12 ENCOUNTER — Ambulatory Visit
Admission: RE | Admit: 2020-07-12 | Discharge: 2020-07-12 | Disposition: A | Discharge status: Home / Self Care | Payer: Self-pay | Source: Ambulatory Visit | Attending: Oncology | Admitting: Oncology

## 2020-07-12 DIAGNOSIS — R911 Solitary pulmonary nodule: Secondary | ICD-10-CM | POA: Insufficient documentation

## 2020-07-13 ENCOUNTER — Inpatient Hospital Stay: Payer: Self-pay | Attending: Oncology | Admitting: Oncology

## 2020-07-13 DIAGNOSIS — Z8249 Family history of ischemic heart disease and other diseases of the circulatory system: Secondary | ICD-10-CM | POA: Insufficient documentation

## 2020-07-13 DIAGNOSIS — Z823 Family history of stroke: Secondary | ICD-10-CM | POA: Insufficient documentation

## 2020-07-13 DIAGNOSIS — K8020 Calculus of gallbladder without cholecystitis without obstruction: Secondary | ICD-10-CM | POA: Insufficient documentation

## 2020-07-13 DIAGNOSIS — R911 Solitary pulmonary nodule: Secondary | ICD-10-CM | POA: Insufficient documentation

## 2020-07-13 DIAGNOSIS — J449 Chronic obstructive pulmonary disease, unspecified: Secondary | ICD-10-CM | POA: Insufficient documentation

## 2020-07-13 DIAGNOSIS — Z833 Family history of diabetes mellitus: Secondary | ICD-10-CM | POA: Insufficient documentation

## 2020-07-13 DIAGNOSIS — Z801 Family history of malignant neoplasm of trachea, bronchus and lung: Secondary | ICD-10-CM | POA: Insufficient documentation

## 2020-07-13 DIAGNOSIS — Z79899 Other long term (current) drug therapy: Secondary | ICD-10-CM | POA: Insufficient documentation

## 2020-07-13 DIAGNOSIS — Z8042 Family history of malignant neoplasm of prostate: Secondary | ICD-10-CM | POA: Insufficient documentation

## 2020-07-13 DIAGNOSIS — Z87891 Personal history of nicotine dependence: Secondary | ICD-10-CM | POA: Insufficient documentation

## 2020-07-13 DIAGNOSIS — I700 Atherosclerosis of aorta: Secondary | ICD-10-CM | POA: Insufficient documentation

## 2020-07-13 DIAGNOSIS — F419 Anxiety disorder, unspecified: Secondary | ICD-10-CM | POA: Insufficient documentation

## 2020-07-13 DIAGNOSIS — F32A Depression, unspecified: Secondary | ICD-10-CM | POA: Insufficient documentation

## 2020-07-13 NOTE — Progress Notes (Signed)
Pulmonary Nodule Clinic Consult note [facility]  [phone] [phone]  Patient Care Team: Marjie Skiffannady, [name], NP as PCP - General (Nurse Practitioner) Glory Buffhode, Hayley, RN as Registered Nurse Marlowe Saxate, Pamela J, RN as Case Manager (General Practice)   Name of the patient: Courtney Davidson  [id number]  27-Apr-1976   Date of visit: 07/13/2020   Diagnosis- Lung Nodule  Chief complaint/ Reason for visit- Pulmonary Nodule Clinic Initial Visit  Past Medical History:  Patient is referred from PCP Jimmie MollyJolene Canady, NP.  Patient presented back in August 2018 with stable pulmonary nodules.  Repeat chest CT on 06/02/2018 revealed interval development of multiple nodules throughout the both lungs with the largest being 7 mm spiculated nodule in right upper lobe.  This was highly concerning for metastatic disease.  She was referred to Dr. Merlene Pullingorcoran for recommendation and management.  Had PET scan completed on 06/10/2018 which demonstrated multiple small pulmonary nodules many of which were hypermetabolic.  There is no evidence of hypermetabolic primary malignancy.  Given lack of primary malignancy, nodules were likely infectious/inflammatory.  It was recommended she have a repeat chest CT in 2 to 3 months, biopsy or pulmonary referral.  Patient was referred to Dr. Sung AmabileSimonds and was lost to oncology ollow-up.  She was evaluated by Dr. Sung AmabileSimonds on 08/27/2018 and had pulmonary function test performed.  She was started on Chantix to help her quit smoking.  Patient states Dr. Sung AmabileSimonds was "not concerned about pulmonary nodules but did recommend follow-up in 2 to 3 months".   I personally reviewed all of her previous imaging including CT chest on 02/03/2017, 06/02/2018 and PET scan on 06/10/2018.  She had a CT chest without contrast completed on 07/12/2019 which revealed partial regression of mid to upper lung predominant pulmonary nodules as described.  When comparing the scan to  06/02/2018 imaging the number of previously noted pulmonary nodules has decreased and the remaining nodules all appear decreased in size.  This is best demonstrated by the largest nodule in the right upper lobe which currently measures 6 x 4 mm previously 7 x 6 mm on 06/02/2018.  These nodules are again mid to upper lung predominant.  No larger more suspicious appearing pulmonary nodules or masses are noted.  Interval history-patient presents to pulmonary nodule clinic to review latest CT chest which was performed yesterday 07/12/20.   Per medical chart review, she did not follow-up with Dr. Merlene Pullingorcoran or Dr. Sung AmabileSimonds after their initial visits in early 2020.  She has been evaluated by her PCP on several occasions for chronic care management.  Today, she has been doing well.  She is very nervous for her results today.  She has seasonal allergies and has a "head cold".  Denies any cough, fever or concerning symptoms.  She has not been vaccinated against COVID.  She currently does not smoke.  She does vape.  She quit smoking approximately 2 year ago.  She smoked 1 to 2 packs of cigarettes daily.  She currently is not employed.  She is disabled.  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   We spoke at length about textile occupational exposure such as  dyes and solvents, many of them being carcinogenic, are being used worldwide in the [employer] industry. The [employer] industry workers are therefore, in continuous exposure to these dyes, solvents, fibre dusts and various other toxic chemicals.  She denies any known exposures to cause cancer and notes face and  eye protection.  Patient has past family history positive for cancer.  Her mother died of small cell lung cancer and her dad died of prostate cancer.  She does not have personal history of cancer.  She has been diagnosed with mild COPD/emphysema.  She is currently not taking any medications for  this.    Her pmh is positive for: Past Medical History:  Diagnosis Date  . Anxiety   . Carpal tunnel syndrome   . CFS (chronic fatigue syndrome)   . Depression   . Fatigue   . Hypoglycemia    Her past surgical history is positive for: Past Surgical History:  Procedure Laterality Date  . CARPAL TUNNEL RELEASE Bilateral   . CESAREAN SECTION     ECOG FS:0 - Asymptomatic  Review of systems- Review of Systems  Constitutional: Negative.  Negative for chills, fever, malaise/fatigue and weight loss.  HENT: Negative for congestion, ear pain and tinnitus.   Eyes: Negative.  Negative for blurred vision and double vision.  Respiratory: Positive for cough and shortness of breath. Negative for sputum production.   Cardiovascular: Negative.  Negative for chest pain, palpitations and leg swelling.  Gastrointestinal: Negative.  Negative for abdominal pain, constipation, diarrhea, nausea and vomiting.  Genitourinary: Negative for dysuria, frequency and urgency.  Musculoskeletal: Negative for back pain and falls.  Skin: Negative.  Negative for rash.  Neurological: Negative.  Negative for weakness and headaches.  Endo/Heme/Allergies: Negative.  Does not bruise/bleed easily.  Psychiatric/Behavioral: Negative.  Negative for depression. The patient is not nervous/anxious and does not have insomnia.      No Known Allergies  Social History   Socioeconomic History  . Marital status: Divorced    Spouse name: Not on file  . Number of children: Not on file  . Years of education: Not on file  . Highest education level: Not on file  Occupational History  . Occupation: unemployed  Tobacco Use  . Smoking status: Former Smoker    Packs/day: 1.50    Years: 30.00    Pack years: 45.00    Types: Cigarettes    Quit date: 08/01/2018    Years since quitting: 1.9  . Smokeless tobacco: Never Used  . Tobacco comment: remains smoking free  Vaping Use  . Vaping Use: Every day  Substance and Sexual  Activity  . Alcohol use: No  . Drug use: No  . Sexual activity: Yes  Other Topics Concern  . Not on file  Social History Narrative  . Not on file   Social Determinants of Health   Financial Resource Strain: High Risk  . Difficulty of Paying Living Expenses: Hard  Food Insecurity: No Food Insecurity  . Worried About Programme researcher, broadcasting/film/videounning Out of Food in the Last Year: Never true  . Ran Out of Food in the Last Year: Never true  Transportation Needs: No Transportation Needs  . Lack of Transportation (Medical): No  . Lack of Transportation (Non-Medical): No  Physical Activity: Not on file  Stress: Stress Concern Present  . Feeling of Stress : Very much  Social Connections: Not on file  Intimate Partner Violence: Not on file    Family History  Problem Relation Age of Onset  . Cancer Mother        lung  . Diabetes Mother   . Stroke Mother   . Hypertension Mother   . Cancer Father      Current Outpatient Medications:  .  amphetamine-dextroamphetamine (ADDERALL) 30 MG tablet, Take 1 tablet by mouth  2 (two) times daily., Disp: 60 tablet, Rfl: 0 .  diclofenac Sodium (VOLTAREN) 1 % GEL, Apply 2 g topically 3 (three) times daily., Disp: 100 g, Rfl: 3 .  FLUoxetine (PROZAC) 40 MG capsule, Take 1 capsule (40 mg total) by mouth daily., Disp: 90 capsule, Rfl: 4 .  naproxen sodium (ALEVE) 220 MG tablet, Take 220 mg by mouth 2 (two) times daily as needed., Disp: , Rfl:   Physical exam: There were no vitals filed for this visit. Physical Exam Constitutional:      General: Vital signs are normal.     Appearance: Normal appearance.  HENT:     Head: Normocephalic and atraumatic.  Eyes:     Pupils: Pupils are equal, round, and reactive to light.  Cardiovascular:     Rate and Rhythm: Normal rate and regular rhythm.     Heart sounds: Normal heart sounds. No murmur heard.   Pulmonary:     Effort: Pulmonary effort is normal.     Breath sounds: Normal breath sounds. No wheezing.  Abdominal:      General: Bowel sounds are normal. There is no distension.     Palpations: Abdomen is soft.     Tenderness: There is no abdominal tenderness.  Musculoskeletal:        General: No edema. Normal range of motion.     Cervical back: Normal range of motion.  Skin:    General: Skin is warm and dry.     Findings: No rash.  Neurological:     Mental Status: She is alert and oriented to person, place, and time.  Psychiatric:        Judgment: Judgment normal.       CMP Latest Ref Rng & Units 11/24/2018  Glucose 65 - 99 mg/dL 91  BUN 6 - 24 mg/dL 11  Creatinine 6.31 - 4.97 mg/dL 0.26  Sodium 378 - 588 mmol/L 140  Potassium 3.5 - 5.2 mmol/L 4.0  Chloride 96 - 106 mmol/L 102  CO2 20 - 29 mmol/L 26  Calcium 8.7 - 10.2 mg/dL 9.5  Total Protein 6.0 - 8.5 g/dL 6.4  Total Bilirubin 0.0 - 1.2 mg/dL 0.3  Alkaline Phos 39 - 117 IU/L 46  AST 0 - 40 IU/L 17  ALT 0 - 32 IU/L 13   CBC Latest Ref Rng & Units 11/24/2018  WBC 3.4 - 10.8 x10E3/uL 7.1  Hemoglobin 11.1 - 15.9 g/dL 50.2  Hematocrit 77.4 - 46.6 % 37.7  Platelets 150 - 450 x10E3/uL 338    No images are attached to the encounter.  CT Chest Wo Contrast  Result Date: 07/12/2020 CLINICAL DATA:  Follow-up lung nodule, smoker EXAM: CT CHEST WITHOUT CONTRAST TECHNIQUE: Multidetector CT imaging of the chest was performed following the standard protocol without IV contrast. COMPARISON:  CT chest, 07/12/2019 PET-CT, 06/10/2018 FINDINGS: Cardiovascular: Aortic atherosclerosis. Normal heart size. No pericardial effusion. Mediastinum/Nodes: No enlarged mediastinal, hilar, or axillary lymph nodes. Thyroid gland, trachea, and esophagus demonstrate no significant findings. Lungs/Pleura: Moderate centrilobular emphysema. Diffuse bilateral bronchial wall thickening. Multiple small pulmonary nodules in the bilateral upper lobes are stable compared to prior examinations, overall diminished with time over multiple prior examinations and consistent with benign  infectious or inflammatory etiology. The largest nodule in the apical right upper lobe measures 7 mm (series 3, image 41). There is an additional background of innumerable tiny centrilobular and ground-glass pulmonary nodules. No pleural effusion or pneumothorax. Upper Abdomen: No acute abnormality.  Gallstone. Musculoskeletal: No chest wall mass  or suspicious bone lesions identified. IMPRESSION: 1. Multiple small pulmonary nodules in the bilateral upper lobes are stable compared to prior examinations, overall diminished with time over multiple prior examinations and consistent with benign infectious or inflammatory etiology. No further follow-up is required for these nodules. 2. There is an additional background of innumerable tiny centrilobular and ground-glass pulmonary nodules, consistent with smoking-related respiratory bronchiolitis. 3. Moderate emphysema and diffuse bilateral bronchial wall thickening. 4. Cholelithiasis. Aortic Atherosclerosis (ICD10-I70.0) and Emphysema (ICD10-J43.9). Electronically Signed   By: Lauralyn Primes M.D.   On: 07/12/2020 10:59   Assessment and plan- Patient is a 45 y.o. female who presents to pulmonary nodule clinic for follow-up of incidental lung nodules.    CT chest from 07/12/2020 she has multiple small pulmonary nodules in the bilateral upper lobes that are stable compared to prior examinations.  Overall diminished with time over multiple prior examinations and consistent with benign infectious or inflammatory etiology.  No further follow-up is required for these nodules.  Innumerable tiny central volar and groundglass pulmonary nodules consistent with smoking-related respiratory bronchiolitis.   CT chest without contrast from   Calculating malignancy probability of a pulmonary nodule: Risk factors include: 1.  Age. 2.  Cancer history. 3.  Diameter of pulmonary nodule and mm 4.  Location 5.  Smoking history 6.  Spiculation present   Based on risk factors, this  patient is HIGH risk for the development of lung cancer.  She quit smoking approximately 2 years ago.  She does not meet criteria for the low-dose CT screening program given her age of 25.   During our visit, we discussed pulmonary nodules are a common incidental finding and are often how lung cancer is discovered.  Lung cancer survival is directly related to the stage at diagnosis.  We discussed that nodules can vary in presentation from solitary pulmonary nodules to masses, 2 groundglass opacities and multiple nodules.  Pulmonary nodules in the majority of cases are benign but the probability of these becoming malignant cannot be undermined.  Early identification of malignant nodules could lead to early diagnosis and increased survival.   We discussed the probability of pulmonary nodules becoming malignant increase with age, pack years of tobacco use, size/characteristics of the nodule and location; with upper lobe involvement being most worrisome.   We discussed the goal of our clinic is to thoroughly evaluate each nodule, developed a comprehensive, individualized plan of care utilizing the most advanced technology and significantly reduce the time from detection to treatment.  A dedicated pulmonary nodule clinic has proven to indeed expedite the detection and treatment of lung cancer.   Patient education in fact sheet provided along with most recent CT scans.  Plan: Discussed CT scan results from 07/12/2019. Review personal and family history. Review occupational exposures.  Disposition: No additional follow-up needed in our clinic. We will send message to PCP. Referral to LDCT screening program once she meets criteria.   Visit Diagnosis 1. Lung nodule   2. Personal history of tobacco use, presenting hazards to health     Patient expressed understanding and was in agreement with this plan. She also understands that She can call clinic at any time with any questions, concerns, or  complaints.   Greater than 50% was spent in counseling and coordination of care with this patient including but not limited to discussion of the relevant topics above (See A&P) including, but not limited to diagnosis and management of acute and chronic medical conditions.   Thank you for  allowing me to participate in the care of this very pleasant patient.    Mauro Kaufmann, NP [facility] at [facility] Cell - [phone] Pager- [phone] 07/13/2020 1:46 PM  CC: Jimmie Molly, NP

## 2020-07-20 ENCOUNTER — Encounter: Payer: Self-pay | Admitting: Nurse Practitioner

## 2020-07-20 ENCOUNTER — Telehealth (INDEPENDENT_AMBULATORY_CARE_PROVIDER_SITE_OTHER): Payer: Self-pay | Admitting: Nurse Practitioner

## 2020-07-20 DIAGNOSIS — G9332 Myalgic encephalomyelitis/chronic fatigue syndrome: Secondary | ICD-10-CM

## 2020-07-20 DIAGNOSIS — F411 Generalized anxiety disorder: Secondary | ICD-10-CM

## 2020-07-20 DIAGNOSIS — R5382 Chronic fatigue, unspecified: Secondary | ICD-10-CM

## 2020-07-20 DIAGNOSIS — F322 Major depressive disorder, single episode, severe without psychotic features: Secondary | ICD-10-CM

## 2020-07-20 MED ORDER — AMPHETAMINE-DEXTROAMPHETAMINE 30 MG PO TABS: 30.0000 mg | ORAL_TABLET | Freq: Two times a day (BID) | ORAL | 0 refills | Status: DC

## 2020-07-20 NOTE — Assessment & Plan Note (Signed)
Chronic, ongoing.  Continue current medication regimen.  Prozac, which benefits patient mood.  She denies SI/HI.  GAD score 3.

## 2020-07-20 NOTE — Patient Instructions (Signed)

## 2020-07-20 NOTE — Assessment & Plan Note (Addendum)
Chronic, ongoing.  Has been on long term Adderall which offers benefit.  Continue current regimen and refill monthly.  Obtain UDS and contract next visit.  Refill sent upon request, to be filled next 08/05/19, and PDMP reviewed.  Return in 3 months.

## 2020-07-20 NOTE — Assessment & Plan Note (Signed)
Chronic, ongoing.  Denies SI/HI.  Continue current medication regimen, Prozac, and adjust as needed.  She is not taking Lithium at this time due to cost, but scores have improved PHQ9 today is 2.  Continue to collaborate with CCM team as needed.  At this time she denies any needs.  Return in 3 months.

## 2020-07-20 NOTE — Progress Notes (Signed)
There were no vitals taken for this visit.   Subjective:    Patient ID: Courtney Davidson, female    DOB: June 28, 1976, 45 y.o.   MRN: [id number]  HPI: Courtney Davidson is a 45 y.o. female  Chief Complaint  Patient presents with  . Anxiety  . Depression    . This visit was completed via telephone due to the restrictions of the COVID-19 pandemic. All issues as above were discussed and addressed but no physical exam was performed. If it was felt that the patient should be evaluated in the office, they were directed there. The patient verbally consented to this visit. Patient was unable to complete an audio/visual visit due to Technical difficulties, Lack of internet. Due to the catastrophic nature of the COVID-19 pandemic, this visit was done through audio contact only. . Location of the patient: home . Location of the provider: home . Those involved with this call:  . Provider: Marnee Guarneri, DNP . CMA: Frazier Butt, CMA . Front Desk/Registration: Jill Side  . Time spent on call: 21 minutes on the phone discussing health concerns. 15 minutes total spent in review of patient's record and preparation of their chart.  . I verified patient identity using two factors (patient name and date of birth). Patient consents verbally to being seen via telemedicine visit today.   DEPRESSION Continues on Prozac and Adderall  Currently Adderall paid for out of pocket,  $33 and $10 for Prozac.  Adderall currently more for chronic fatigue syndrome, she has been on this for several years and does not wish to discontinue at this time -- last fill on 07/04/20.  Lost job back in July 2020 and currently no insurance.  Denies SI/HI.   Mood status: controlled Mood status: stable Satisfied with current treatment?: yes Symptom severity: moderate  Duration of current treatment : chronic Side effects: no Medication compliance: good compliance Psychotherapy/counseling: none Depressed mood:  sometimes Anxious mood: sometimes Anhedonia: no Significant weight loss or gain: no Insomnia: none Fatigue: yes Feelings of worthlessness or guilt: none Impaired concentration/indecisiveness: none Suicidal ideations: no Hopelessness: no Crying spells: none Depression screen [facility] 2/9 07/20/2020 04/17/2020 12/19/2019 07/29/2019 12/28/2018  Decreased Interest 0 0 0 2 1  Down, Depressed, Hopeless 0 0 0 3 1  PHQ - 2 Score 0 0 0 5 2  Altered sleeping 1 2 0 3 1  Tired, decreased energy '1 3 2 2 3  ' Change in appetite 0 0 0 1 0  Feeling bad or failure about yourself  0 0 1 2 0  Trouble concentrating 0 0 0 2 0  Moving slowly or fidgety/restless 0 0 0 0 0  Suicidal thoughts 0 0 0 0 0  PHQ-9 Score '2 5 3 15 6  ' Difficult doing work/chores - Somewhat difficult Somewhat difficult Very difficult Not difficult at all  Some recent data might be hidden   GAD 7 : Generalized Anxiety Score 07/20/2020 04/17/2020 12/19/2019 07/29/2019  Nervous, Anxious, on Edge 0 0 0 2  Control/stop worrying 3 0 0 2  Worry too much - different things 0 0 0 2  Trouble relaxing 0 0 0 2  Restless 0 0 0 0  Easily annoyed or irritable 0 '3 3 1  ' Afraid - awful might happen 0 0 0 1  Total GAD 7 Score '3 3 3 10  ' Anxiety Difficulty - Somewhat difficult Somewhat difficult Somewhat difficult   Relevant past medical, surgical, family and social history reviewed and updated as indicated.  Interim medical history since our last visit reviewed. Allergies and medications reviewed and updated.  Review of Systems  Constitutional: Positive for fatigue. Negative for activity change, appetite change, diaphoresis and fever.  Respiratory: Negative for cough, chest tightness and shortness of breath.   Cardiovascular: Negative for chest pain, palpitations and leg swelling.  Psychiatric/Behavioral: Negative for decreased concentration, self-injury, sleep disturbance and suicidal ideas. The patient is not nervous/anxious.     Per HPI unless  specifically indicated above     Objective:    There were no vitals taken for this visit.  Wt Readings from Last 3 Encounters:  04/17/20 171 lb 12.8 oz (77.9 kg)  07/29/19 158 lb (71.7 kg)  11/16/18 150 lb (68 kg)    Physical Exam   Unable to perform due to telephone visit only -- was able to see patient, but patient could not see or hear provider  Results for orders placed or performed in visit on 11/24/18  Uric acid  Result Value Ref Range   Uric Acid 3.3 2.5 - 7.1 mg/dL  VITAMIN D 25 Hydroxy (Vit-D Deficiency, Fractures)  Result Value Ref Range   Vit D, 25-Hydroxy 22.8 (L) 30.0 - 100.0 ng/mL  Comprehensive metabolic panel  Result Value Ref Range   Glucose 91 65 - 99 mg/dL   BUN 11 6 - 24 mg/dL   Creatinine, Ser 0.85 0.57 - 1.00 mg/dL   GFR calc non Af Amer 84 >59 mL/min/1.73   GFR calc Af Amer 96 >59 mL/min/1.73   BUN/Creatinine Ratio 13 9 - 23   Sodium 140 134 - 144 mmol/L   Potassium 4.0 3.5 - 5.2 mmol/L   Chloride 102 96 - 106 mmol/L   CO2 26 20 - 29 mmol/L   Calcium 9.5 8.7 - 10.2 mg/dL   Total Protein 6.4 6.0 - 8.5 g/dL   Albumin 4.3 3.8 - 4.8 g/dL   Globulin, Total 2.1 1.5 - 4.5 g/dL   Albumin/Globulin Ratio 2.0 1.2 - 2.2   Bilirubin Total 0.3 0.0 - 1.2 mg/dL   Alkaline Phosphatase 46 39 - 117 IU/L   AST 17 0 - 40 IU/L   ALT 13 0 - 32 IU/L  CBC with Differential/Platelet  Result Value Ref Range   WBC 7.1 3.4 - 10.8 x10E3/uL   RBC 4.43 3.77 - 5.28 x10E6/uL   Hemoglobin 13.3 11.1 - 15.9 g/dL   Hematocrit 37.7 34.0 - 46.6 %   MCV 85 79 - 97 fL   MCH 30.0 26.6 - 33.0 pg   MCHC 35.3 31.5 - 35.7 g/dL   RDW 12.4 11.7 - 15.4 %   Platelets 338 150 - 450 x10E3/uL   Neutrophils 62 Not Estab. %   Lymphs 30 Not Estab. %   Monocytes 6 Not Estab. %   Eos 2 Not Estab. %   Basos 0 Not Estab. %   Neutrophils Absolute 4.4 1.4 - 7.0 x10E3/uL   Lymphocytes Absolute 2.1 0.7 - 3.1 x10E3/uL   Monocytes Absolute 0.4 0.1 - 0.9 x10E3/uL   EOS (ABSOLUTE) 0.2 0.0 - 0.4  x10E3/uL   Basophils Absolute 0.0 0.0 - 0.2 x10E3/uL   Immature Granulocytes 0 Not Estab. %   Immature Grans (Abs) 0.0 0.0 - 0.1 x10E3/uL  Thyroid Panel With TSH  Result Value Ref Range   TSH 0.894 0.450 - 4.500 uIU/mL   T4, Total 8.5 4.5 - 12.0 ug/dL   T3 Uptake Ratio 27 24 - 39 %   Free Thyroxine Index 2.3 1.2 -  4.9  C-reactive protein  Result Value Ref Range   CRP 1 0 - 10 mg/L  Sed Rate (ESR)  Result Value Ref Range   Sed Rate 4 0 - 32 mm/hr  ANA w/Reflex if Positive  Result Value Ref Range   Anti Nuclear Antibody (ANA) Negative Negative  Rheumatoid factor  Result Value Ref Range   Rhuematoid fact SerPl-aCnc <10.0 0.0 - 13.9 IU/mL  Lithium level  Result Value Ref Range   Lithium Lvl 0.2 (L) 0.6 - 1.2 mmol/L      Assessment & Plan:   Problem List Items Addressed This Visit      Nervous and Auditory   CFS (chronic fatigue syndrome)    Chronic, ongoing.  Has been on long term Adderall which offers benefit.  Continue current regimen and refill monthly.  Obtain UDS and contract next visit.  Refill sent upon request, to be filled next 08/05/19, and PDMP reviewed.  Return in 3 months.        Other   Generalized anxiety disorder    Chronic, ongoing.  Continue current medication regimen.  Prozac, which benefits patient mood.  She denies SI/HI.  GAD score 3.      Depression, major, single episode, severe ([place]) - Primary    Chronic, ongoing.  Denies SI/HI.  Continue current medication regimen, Prozac, and adjust as needed.  She is not taking Lithium at this time due to cost, but scores have improved PHQ9 today is 2.  Continue to collaborate with CCM team as needed.  At this time she denies any needs.  Return in 3 months.         Controlled substance.  Checked data base and no other refills of controlled substances noted.  Last Adderall refill 07/05/19, with no other controlled substances noted on PDMP.  I discussed the assessment and treatment plan with the patient. The patient  was provided an opportunity to ask questions and all were answered. The patient agreed with the plan and demonstrated an understanding of the instructions.   The patient was advised to call back or seek an in-person evaluation if the symptoms worsen or if the condition fails to improve as anticipated.   I provided 21+ minutes of time during this encounter.  Follow up plan: Return in about 3 months (around 10/18/2020) for MOOD.

## 2020-07-23 ENCOUNTER — Telehealth: Payer: Self-pay

## 2020-07-23 NOTE — Telephone Encounter (Signed)
LVM TO MAKE APT 

## 2020-07-23 NOTE — Telephone Encounter (Signed)
-----   Message from Marjie Skiff, NP sent at 07/20/2020  4:55 PM EST ----- 3 month follow-up needed

## 2020-07-25 NOTE — Telephone Encounter (Signed)
LVM TO MAKE APT 

## 2020-07-27 ENCOUNTER — Encounter: Payer: Self-pay | Admitting: Nurse Practitioner

## 2020-07-27 NOTE — Telephone Encounter (Signed)
LVM TO MAKE APT.Sent letter

## 2020-08-06 ENCOUNTER — Other Ambulatory Visit: Payer: Self-pay

## 2020-08-06 NOTE — Telephone Encounter (Signed)
Medication sent in on 08/04/20

## 2020-08-06 NOTE — Telephone Encounter (Signed)
Please refuse

## 2020-09-05 ENCOUNTER — Other Ambulatory Visit: Payer: Self-pay

## 2020-09-05 MED ORDER — AMPHETAMINE-DEXTROAMPHETAMINE 30 MG PO TABS: 30.0000 mg | ORAL_TABLET | Freq: Two times a day (BID) | ORAL | 0 refills | Status: DC

## 2020-09-05 NOTE — Telephone Encounter (Signed)
Patient last seen for video visit 07/20/20 and no upcoming appointment.

## 2020-10-04 ENCOUNTER — Other Ambulatory Visit: Payer: Self-pay

## 2020-10-04 MED ORDER — AMPHETAMINE-DEXTROAMPHETAMINE 30 MG PO TABS: 30.0000 mg | ORAL_TABLET | Freq: Two times a day (BID) | ORAL | 0 refills | Status: DC

## 2020-10-04 NOTE — Telephone Encounter (Signed)
Called pt to schedule 3 months (around 10/18/2020) for MOOD. No answer no vm 

## 2020-10-04 NOTE — Telephone Encounter (Signed)
Routing to provider  

## 2020-10-04 NOTE — Telephone Encounter (Signed)
Last fill 09/05/20 on PDMP review

## 2020-10-05 NOTE — Telephone Encounter (Signed)
Called pt to schedule 3 months (around 10/18/2020) for MOOD. No answer no vm

## 2020-10-08 NOTE — Telephone Encounter (Signed)
Pt scheduled for apt 10/22/2020

## 2020-10-22 ENCOUNTER — Encounter: Payer: Self-pay | Admitting: Nurse Practitioner

## 2020-10-22 ENCOUNTER — Ambulatory Visit (INDEPENDENT_AMBULATORY_CARE_PROVIDER_SITE_OTHER): Payer: Self-pay | Admitting: Nurse Practitioner

## 2020-10-22 ENCOUNTER — Other Ambulatory Visit: Payer: Self-pay

## 2020-10-22 VITALS — BP 108/73 | HR 99 | Temp 98.3°F | Wt 171.8 lb

## 2020-10-22 DIAGNOSIS — R5382 Chronic fatigue, unspecified: Secondary | ICD-10-CM

## 2020-10-22 DIAGNOSIS — G9332 Myalgic encephalomyelitis/chronic fatigue syndrome: Secondary | ICD-10-CM

## 2020-10-22 DIAGNOSIS — Z79899 Other long term (current) drug therapy: Secondary | ICD-10-CM | POA: Insufficient documentation

## 2020-10-22 DIAGNOSIS — F322 Major depressive disorder, single episode, severe without psychotic features: Secondary | ICD-10-CM

## 2020-10-22 DIAGNOSIS — F411 Generalized anxiety disorder: Secondary | ICD-10-CM

## 2020-10-22 DIAGNOSIS — Z599 Problem related to housing and economic circumstances, unspecified: Secondary | ICD-10-CM

## 2020-10-22 DIAGNOSIS — F17210 Nicotine dependence, cigarettes, uncomplicated: Secondary | ICD-10-CM

## 2020-10-22 MED ORDER — AMPHETAMINE-DEXTROAMPHETAMINE 30 MG PO TABS: 30.0000 mg | ORAL_TABLET | Freq: Two times a day (BID) | ORAL | 0 refills | Status: DC

## 2020-10-22 MED ORDER — FLUOXETINE HCL 40 MG PO CAPS: 40.0000 mg | ORAL_CAPSULE | Freq: Every day | ORAL | 4 refills | Status: DC

## 2020-10-22 NOTE — Progress Notes (Signed)
BP 108/73   Pulse 99   Temp 98.3 F (36.8 C) (Oral)   Wt 171 lb 12.8 oz (77.9 kg)   SpO2 97%   BMI 31.42 kg/m    Subjective:    Patient ID: Courtney Davidson, female    DOB: Feb 28, 1976, 45 y.o.   MRN: [id number]  HPI: Courtney Davidson is a 45 y.o. female  Chief Complaint  Patient presents with  . Mood    Patient is here for Mood recheck. Patient denies having any problems or concerns at today's visit.    DEPRESSION Continues on Prozac and Adderall  Currently Adderall paid for out of pocket, $33 and $10 for Prozac.  Adderall currently more for chronic fatigue syndrome, she has been on this for several years and does not wish to discontinue.  Lost job back in July 2020 and currently no insurance.  Denies SI/HI.  Last filled Adderall on 10/04/20.   Mood status: controlled Mood status: stable Satisfied with current treatment?: yes Symptom severity: moderate  Duration of current treatment : chronic Side effects: no Medication compliance: good compliance Psychotherapy/counseling: none Depressed mood: yes Anxious mood: sometimes Anhedonia: no Significant weight loss or gain: no Insomnia: yes hard to fall asleep Fatigue: yes Feelings of worthlessness or guilt: yes Impaired concentration/indecisiveness: yes Suicidal ideations: no Hopelessness: no Crying spells: yes Depression screen [facility] 2/9 10/22/2020 07/20/2020 04/17/2020 12/19/2019 07/29/2019  Decreased Interest 0 0 0 0 2  Down, Depressed, Hopeless 0 0 0 0 3  PHQ - 2 Score 0 0 0 0 5  Altered sleeping 0 1 2 0 3  Tired, decreased energy _0 Change in appetite 0 0 0 0 1  Feeling bad or failure about yourself  0 0 0 1 2  Trouble concentrating 0 0 0 0 2  Moving slowly or fidgety/restless 0 0 0 0 0  Suicidal thoughts 0 0 0 0 0  PHQ-9 Score _1 Difficult doing work/chores - - Somewhat difficult Somewhat difficult Very difficult  Some recent data might be hidden   GAD 7 : Generalized Anxiety Score 10/22/2020  07/20/2020 04/17/2020 12/19/2019  Nervous, Anxious, on Edge 0 0 0 0  Control/stop worrying 0 3 0 0  Worry too much - different things 0 0 0 0  Trouble relaxing 0 0 0 0  Restless 0 0 0 0  Easily annoyed or irritable 3 0 3 3  Afraid - awful might happen 0 0 0 0  Total GAD 7 Score _2 Anxiety Difficulty - - Somewhat difficult Somewhat difficult    Relevant past medical, surgical, family and social history reviewed and updated as indicated. Interim medical history since our last visit reviewed. Allergies and medications reviewed and updated.  Review of Systems  Constitutional: Positive for fatigue. Negative for activity change, appetite change, diaphoresis and fever.  Respiratory: Negative for cough, chest tightness and shortness of breath.   Cardiovascular: Negative for chest pain, palpitations and leg swelling.  Psychiatric/Behavioral: Positive for decreased concentration and sleep disturbance. Negative for self-injury and suicidal ideas. The patient is nervous/anxious.     Per HPI unless specifically indicated above     Objective:    BP 108/73   Pulse 99   Temp 98.3 F (36.8 C) (Oral)   Wt 171 lb 12.8 oz (77.9 kg)   SpO2 97%   BMI 31.42 kg/m   Wt Readings from Last 3 Encounters:  10/22/20 171 lb  12.8 oz (77.9 kg)  04/17/20 171 lb 12.8 oz (77.9 kg)  07/29/19 158 lb (71.7 kg)    Physical Exam Vitals and nursing note reviewed.  Constitutional:      General: She is awake. She is not in acute distress.    Appearance: She is well-developed. She is not ill-appearing.  HENT:     Head: Normocephalic.     Right Ear: Hearing normal.     Left Ear: Hearing normal.  Eyes:     General: Lids are normal.        Right eye: No discharge.        Left eye: No discharge.     Conjunctiva/sclera: Conjunctivae normal.     Pupils: Pupils are equal, round, and reactive to light.  Neck:     Vascular: No carotid bruit.  Cardiovascular:     Rate and Rhythm: Normal rate and regular  rhythm.     Heart sounds: Normal heart sounds. No murmur heard. No gallop.   Pulmonary:     Effort: Pulmonary effort is normal. No accessory muscle usage or respiratory distress.     Breath sounds: Normal breath sounds.  Abdominal:     General: Bowel sounds are normal.     Palpations: Abdomen is soft.  Musculoskeletal:     Cervical back: Normal range of motion and neck supple.     Right lower leg: No edema.     Left lower leg: No edema.  Skin:    General: Skin is warm and dry.  Neurological:     Mental Status: She is alert and oriented to person, place, and time.  Psychiatric:        Attention and Perception: Attention normal.        Mood and Affect: Mood normal.        Speech: Speech normal.        Behavior: Behavior normal. Behavior is cooperative.        Thought Content: Thought content normal.     Results for orders placed or performed in visit on 11/24/18  Uric acid  Result Value Ref Range   Uric Acid 3.3 2.5 - 7.1 mg/dL  VITAMIN D 25 Hydroxy (Vit-D Deficiency, Fractures)  Result Value Ref Range   Vit D, 25-Hydroxy 22.8 (L) 30.0 - 100.0 ng/mL  Comprehensive metabolic panel  Result Value Ref Range   Glucose 91 65 - 99 mg/dL   BUN 11 6 - 24 mg/dL   Creatinine, Ser 0.85 0.57 - 1.00 mg/dL   GFR calc non Af Amer 84 >59 mL/min/1.73   GFR calc Af Amer 96 >59 mL/min/1.73   BUN/Creatinine Ratio 13 9 - 23   Sodium 140 134 - 144 mmol/L   Potassium 4.0 3.5 - 5.2 mmol/L   Chloride 102 96 - 106 mmol/L   CO2 26 20 - 29 mmol/L   Calcium 9.5 8.7 - 10.2 mg/dL   Total Protein 6.4 6.0 - 8.5 g/dL   Albumin 4.3 3.8 - 4.8 g/dL   Globulin, Total 2.1 1.5 - 4.5 g/dL   Albumin/Globulin Ratio 2.0 1.2 - 2.2   Bilirubin Total 0.3 0.0 - 1.2 mg/dL   Alkaline Phosphatase 46 39 - 117 IU/L   AST 17 0 - 40 IU/L   ALT 13 0 - 32 IU/L  CBC with Differential/Platelet  Result Value Ref Range   WBC 7.1 3.4 - 10.8 x10E3/uL   RBC 4.43 3.77 - 5.28 x10E6/uL   Hemoglobin 13.3 11.1 - 15.9 g/dL  Hematocrit 37.7 34.0 - 46.6 %   MCV 85 79 - 97 fL   MCH 30.0 26.6 - 33.0 pg   MCHC 35.3 31.5 - 35.7 g/dL   RDW 12.4 11.7 - 15.4 %   Platelets 338 150 - 450 x10E3/uL   Neutrophils 62 Not Estab. %   Lymphs 30 Not Estab. %   Monocytes 6 Not Estab. %   Eos 2 Not Estab. %   Basos 0 Not Estab. %   Neutrophils Absolute 4.4 1.4 - 7.0 x10E3/uL   Lymphocytes Absolute 2.1 0.7 - 3.1 x10E3/uL   Monocytes Absolute 0.4 0.1 - 0.9 x10E3/uL   EOS (ABSOLUTE) 0.2 0.0 - 0.4 x10E3/uL   Basophils Absolute 0.0 0.0 - 0.2 x10E3/uL   Immature Granulocytes 0 Not Estab. %   Immature Grans (Abs) 0.0 0.0 - 0.1 x10E3/uL  Thyroid Panel With TSH  Result Value Ref Range   TSH 0.894 0.450 - 4.500 uIU/mL   T4, Total 8.5 4.5 - 12.0 ug/dL   T3 Uptake Ratio 27 24 - 39 %   Free Thyroxine Index 2.3 1.2 - 4.9  C-reactive protein  Result Value Ref Range   CRP 1 0 - 10 mg/L  Sed Rate (ESR)  Result Value Ref Range   Sed Rate 4 0 - 32 mm/hr  ANA w/Reflex if Positive  Result Value Ref Range   Anti Nuclear Antibody (ANA) Negative Negative  Rheumatoid factor  Result Value Ref Range   Rhuematoid fact SerPl-aCnc <10.0 0.0 - 13.9 IU/mL  Lithium level  Result Value Ref Range   Lithium Lvl 0.2 (L) 0.6 - 1.2 mmol/L      Assessment & Plan:   Problem List Items Addressed This Visit      Nervous and Auditory   CFS (chronic fatigue syndrome)    Chronic, ongoing.  Has been on long term Adderall which offers benefit.  Continue current regimen and refill monthly.  Obtain UDS today, controlled substance contract next visit.  Refill sent today dated for 11/03/20, 12/04/20, 01/03/21, and PDMP reviewed.  Return in 6 months -- will extend follow-up as patient minimal income and difficult to pay for every 3 month visits.      Relevant Orders   [id number] 11+Oxyco+Alc+Crt-Bund     Other   Generalized anxiety disorder    Chronic, ongoing.  Continue current medication regimen.  Prozac, which benefits patient mood.  She denies SI/HI.  GAD  score 3.      Relevant Medications   FLUoxetine (PROZAC) 40 MG capsule   Nicotine dependence, cigarettes, uncomplicated    I have recommended complete cessation of tobacco use. I have discussed various options available for assistance with tobacco cessation including over the counter methods (Nicotine gum, patch and lozenges). We also discussed prescription options (Chantix, Nicotine Inhaler / Nasal Spray). The patient is not interested in pursuing any prescription tobacco cessation options at this time.       Depression, major, single episode, severe ([place]) - Primary    Chronic, ongoing.  Denies SI/HI.  Continue current medication regimen, Prozac, and adjust as needed.  She is not taking Lithium at this time due to cost, but scores have improved PHQ9 today is 3.  Continue to collaborate with CCM team as needed.  At this time she denies any needs.  Return in 6 months.      Relevant Medications   FLUoxetine (PROZAC) 40 MG capsule   Financial difficulties    Discussed at length with her, she refuses referral  to [facility] or CCM at this time.      High risk medication use    UDS today and controlled substance contract next visit.      Relevant Orders   [id number] 11+Oxyco+Alc+Crt-Bund      Controlled substance.  Checked data base and no other refills of controlled substances noted.  Last Adderall refill 10/04/20, with no other controlled substances noted on PDMP.  Follow up plan: Return in about 6 months (around 04/23/2021) for MOOD -- needs controlled substance contract.

## 2020-10-22 NOTE — Assessment & Plan Note (Signed)
Chronic, ongoing.  Has been on long term Adderall which offers benefit.  Continue current regimen and refill monthly.  Obtain UDS today, controlled substance contract next visit.  Refill sent today dated for 11/03/20, 12/04/20, 01/03/21, and PDMP reviewed.  Return in 6 months -- will extend follow-up as patient minimal income and difficult to pay for every 3 month visits.

## 2020-10-22 NOTE — Assessment & Plan Note (Signed)
UDS today and controlled substance contract next visit.

## 2020-10-22 NOTE — Assessment & Plan Note (Signed)
I have recommended complete cessation of tobacco use. I have discussed various options available for assistance with tobacco cessation including over the counter methods (Nicotine gum, patch and lozenges). We also discussed prescription options (Chantix, Nicotine Inhaler / Nasal Spray). The patient is not interested in pursuing any prescription tobacco cessation options at this time.  

## 2020-10-22 NOTE — Assessment & Plan Note (Signed)
Chronic, ongoing.  Denies SI/HI.  Continue current medication regimen, Prozac, and adjust as needed.  She is not taking Lithium at this time due to cost, but scores have improved PHQ9 today is 3.  Continue to collaborate with CCM team as needed.  At this time she denies any needs.  Return in 6 months.

## 2020-10-22 NOTE — Patient Instructions (Signed)
Amphetamine; Dextroamphetamine tablets What is this medicine? AMPHETAMINE; DEXTROAMPHETAMINE(am FET a meen; dex troe am FET a meen) is used to treat attention-deficit hyperactivity disorder (ADHD). It may also be used for narcolepsy. Federal law prohibits giving this medicine to any person other than the person for whom it was prescribed. Do not share this medicine with anyone else. This medicine may be used for other purposes; ask your health care provider or pharmacist if you have questions. COMMON BRAND NAME(S): Adderall What should I tell my health care provider before I take this medicine? They need to know if you have any of these conditions:  anxiety or panic attacks  circulation problems in fingers and toes  glaucoma  hardening or blockages of the arteries or heart blood vessels  heart disease or a heart defect  high blood pressure  history of a drug or alcohol abuse problem  history of stroke  kidney disease  liver disease  mental illness  seizures  suicidal thoughts, plans, or attempt; a previous suicide attempt by you or a family member  thyroid disease  Tourette's syndrome  an unusual or allergic reaction to dextroamphetamine, other amphetamines, other medicines, foods, dyes, or preservatives  pregnant or trying to get pregnant  breast-feeding How should I use this medicine? Take this medicine by mouth. Take it as directed on the prescription label at the same time every day. Usually the last dose of the day will be taken at least 4 to 6 hours before bedtime, so it will not interfere with sleep. Keep taking it unless your health care provider tells you to stop. A special MedGuide will be given to you by the pharmacist with each prescription and refill. Be sure to read this information carefully each time. Talk to your health care provider about the use of this medicine in children. While it may be prescribed for children as young as 3 years for selected  conditions, precautions do apply. Overdosage: If you think you have taken too much of this medicine contact a poison control center or emergency room at once. NOTE: This medicine is only for you. Do not share this medicine with others. What if I miss a dose? If you miss a dose, take it as soon as you can. If it is almost time for your next dose, take only that dose. Do not take double or extra doses. What may interact with this medicine? Do not take this medicine with any of the following medications:  MAOIs like Carbex, Eldepryl, Marplan, Nardil, and Parnate  other stimulant medicines for attention disorders This medicine may also interact with the following medications:  acetazolamide  ammonium chloride  antacids  ascorbic acid  atomoxetine  caffeine  certain medicines for blood pressure  certain medicines for depression, anxiety, or psychotic disturbances  certain medicines for seizures like carbamazepine, phenobarbital, phenytoin  certain medicines for stomach problems like cimetidine, ranitidine, famotidine, esomeprazole, omeprazole, lansoprazole, pantoprazole  lithium  medicines for colds and breathing difficulties  medicines for diabetes  medicines or dietary supplements for weight loss or to stay awake  methenamine  narcotic medicines for pain  quinidine  ritonavir  sodium bicarbonate  St. John's wort This list may not describe all possible interactions. Give your health care provider a list of all the medicines, herbs, non-prescription drugs, or dietary supplements you use. Also tell them if you smoke, drink alcohol, or use illegal drugs. Some items may interact with your medicine. What should I watch for while using this medicine? Visit  your doctor or health care professional for regular checks on your progress. This prescription requires that you follow special procedures with your doctor and pharmacy. You will need to have a new written prescription  from your doctor every time you need a refill. This medicine may affect your concentration, or hide signs of tiredness. Until you know how this medicine affects you, do not drive, ride a bicycle, use machinery, or do anything that needs mental alertness. Tell your doctor or health care professional if this medicine loses its effects, or if you feel you need to take more than the prescribed amount. Do not change the dosage without talking to your doctor or health care professional. Decreased appetite is a common side effect when starting this medicine. Eating small, frequent meals or snacks can help. Talk to your doctor if you continue to have poor eating habits. Height and weight growth of a child taking this medicine will be monitored closely. Do not take this medicine close to bedtime. It may prevent you from sleeping. If you are going to need surgery, a MRI, CT scan, or other procedure, tell your doctor that you are taking this medicine. You may need to stop taking this medicine before the procedure. Tell your doctor or healthcare professional right away if you notice unexplained wounds on your fingers and toes while taking this medicine. You should also tell your healthcare provider if you experience numbness or pain, changes in the skin color, or sensitivity to temperature in your fingers or toes. What side effects may I notice from receiving this medicine? Side effects that you should report to your doctor or health care professional as soon as possible:  allergic reactions like skin rash, itching or hives, swelling of the face, lips, or tongue  anxious  breathing problems  changes in emotions or moods  changes in vision  chest pain or chest tightness  fast, irregular heartbeat  fingers or toes feel numb, cool, painful  hallucination, loss of contact with reality  high blood pressure  males: prolonged or painful erection  seizures  signs and symptoms of serotonin syndrome like  confusion, increased sweating, fever, tremor, stiff muscles, diarrhea  signs and symptoms of a stroke like changes in vision; confusion; trouble speaking or understanding; severe headaches; sudden numbness or weakness of the face, arm or leg; trouble walking; dizziness; loss of balance or coordination  suicidal thoughts or other mood changes  uncontrollable head, mouth, neck, arm, or leg movements Side effects that usually do not require medical attention (report to your doctor or health care professional if they continue or are bothersome):  dry mouth  headache  irritability  loss of appetite  nausea  trouble sleeping  weight loss This list may not describe all possible side effects. Call your doctor for medical advice about side effects. You may report side effects to FDA at 1-800-FDA-1088. Where should I keep my medicine? Keep out of the reach of children. This medicine can be abused. Keep your medicine in a safe place to protect it from theft. Do not share this medicine with anyone. Selling or giving away this medicine is dangerous and against the law. Store at room temperature between 15 and 30 degrees C (59 and 86 degrees F). Keep container tightly closed. Throw away any unused medicine after the expiration date. Dispose of properly. This medicine may cause accidental overdose and death if it is taken by other adults, children, or pets. Mix any unused medicine with a substance like cat  litter or coffee grounds. Then throw the medicine away in a sealed container like a sealed bag or a coffee can with a lid. Do not use the medicine after the expiration date. NOTE: This sheet is a summary. It may not cover all possible information. If you have questions about this medicine, talk to your doctor, pharmacist, or health care provider.  2021 Elsevier/Gold Standard (2020-04-11 14:21:45)

## 2020-10-22 NOTE — Assessment & Plan Note (Signed)
Discussed at length with her, she refuses referral to [facility] or [facility] at this time.

## 2020-10-22 NOTE — Assessment & Plan Note (Signed)
Chronic, ongoing.  Continue current medication regimen.  Prozac, which benefits patient mood.  She denies SI/HI.  GAD score 3.

## 2020-10-29 LAB — DRUG SCREEN 764883 11+OXYCO+ALC+CRT-BUND
BENZODIAZ UR QL: NEGATIVE ng/mL
Barbiturate: NEGATIVE ng/mL
Cannabinoid Quant, Ur: NEGATIVE ng/mL
Cocaine (Metabolite): NEGATIVE ng/mL
Creatinine: 282 mg/dL (ref 20.0–300.0)
Ethanol: NEGATIVE %
Meperidine: NEGATIVE ng/mL
Methadone Screen, Urine: NEGATIVE ng/mL
OPIATE SCREEN URINE: NEGATIVE ng/mL
Oxycodone/Oxymorphone, Urine: NEGATIVE ng/mL
Phencyclidine: NEGATIVE ng/mL
Propoxyphene: NEGATIVE ng/mL
Tramadol: NEGATIVE ng/mL
pH, Urine: 5.7 (ref 4.5–8.9)

## 2020-10-29 LAB — DRUG PROFILE 799016
Amphetamine GC/MS Conf: >3000 ng/mL
Amphetamine: POSITIVE — AB
Amphetamines: POSITIVE — AB
Methamphetamine: NEGATIVE

## 2020-12-27 ENCOUNTER — Other Ambulatory Visit: Payer: Self-pay | Admitting: Nurse Practitioner

## 2020-12-27 NOTE — Telephone Encounter (Signed)
Medication Refill - Medication: Adderall 30 mg  Has the patient contacted their pharmacy? No.  He is going out of town Saturday and will not be back for a week.  (Agent: If no, request that the patient contact the pharmacy for the refill.) (Agent: If yes, when and what did the pharmacy advise?)  Preferred Pharmacy (with phone number or street name): [facility] [address]  Agent: Please be advised that RX refills may take up to 3 business days. We ask that you follow-up with your pharmacy.

## 2020-12-27 NOTE — Telephone Encounter (Signed)
Requested medication (s) are due for refill today:  yes  Requested medication (s) are on the active medication list: yes   Last refill: 10/22/2020  Future visit scheduled:  yes   Notes to clinic:  He is going out of town Saturday and will not be back for a week. This refill cannot be delegated    Requested Prescriptions  Pending Prescriptions Disp Refills   amphetamine-dextroamphetamine (ADDERALL) 30 MG tablet 60 tablet 0    Sig: Take 1 tablet by mouth 2 (two) times daily.      Not Delegated - Psychiatry:  Stimulants/ADHD Failed - 12/27/2020  3:10 PM      Failed - This refill cannot be delegated      Passed - Urine Drug Screen completed in last 360 days      Passed - Valid encounter within last 3 months    Recent Outpatient Visits           2 months ago Depression, major, single episode, severe (HCC)   [facility] [name], [name], NP   5 months ago Depression, major, single episode, severe (HCC)   [facility] [name], [name], NP   8 months ago Depression, major, single episode, severe (HCC)   [facility] [name], [name], NP   1 year ago Depression, major, single episode, severe (HCC)   [facility] [name], [name], NP   1 year ago Depression, major, single episode, severe (HCC)   [facility] [name], [name], NP       Future Appointments             In 3 months [name], [name], NP [facility]

## 2020-12-28 ENCOUNTER — Other Ambulatory Visit: Payer: Self-pay | Admitting: Nurse Practitioner

## 2020-12-28 MED ORDER — AMPHETAMINE-DEXTROAMPHETAMINE 30 MG PO TABS: 30.0000 mg | ORAL_TABLET | Freq: Two times a day (BID) | ORAL | 0 refills | Status: DC

## 2020-12-28 NOTE — Telephone Encounter (Signed)
Pt is scheduled 10/25 

## 2021-01-30 ENCOUNTER — Telehealth: Payer: Self-pay

## 2021-01-30 NOTE — Telephone Encounter (Signed)
Pharmacy called regarding adderall rx.  The pharmacy previously only gave the patient a 25 day supply due to shortage.  Verbal given to refill today due to pharmacy shortage.

## 2021-04-02 ENCOUNTER — Other Ambulatory Visit: Payer: Self-pay | Admitting: Nurse Practitioner

## 2021-04-02 ENCOUNTER — Other Ambulatory Visit: Payer: Self-pay

## 2021-04-02 MED ORDER — AMPHETAMINE-DEXTROAMPHETAMINE 30 MG PO TABS: 30.0000 mg | ORAL_TABLET | Freq: Two times a day (BID) | ORAL | 0 refills | Status: DC

## 2021-04-23 ENCOUNTER — Encounter: Payer: Self-pay | Admitting: Nurse Practitioner

## 2021-04-23 ENCOUNTER — Other Ambulatory Visit: Payer: Self-pay

## 2021-04-23 ENCOUNTER — Ambulatory Visit (INDEPENDENT_AMBULATORY_CARE_PROVIDER_SITE_OTHER): Payer: Self-pay | Admitting: Nurse Practitioner

## 2021-04-23 VITALS — BP 113/72 | HR 83 | Temp 98.7°F | Wt 178.6 lb

## 2021-04-23 DIAGNOSIS — F411 Generalized anxiety disorder: Secondary | ICD-10-CM

## 2021-04-23 DIAGNOSIS — F17210 Nicotine dependence, cigarettes, uncomplicated: Secondary | ICD-10-CM

## 2021-04-23 DIAGNOSIS — F322 Major depressive disorder, single episode, severe without psychotic features: Secondary | ICD-10-CM

## 2021-04-23 DIAGNOSIS — G9332 Myalgic encephalomyelitis/chronic fatigue syndrome: Secondary | ICD-10-CM

## 2021-04-23 MED ORDER — AMPHETAMINE-DEXTROAMPHETAMINE 30 MG PO TABS: 30.0000 mg | ORAL_TABLET | Freq: Two times a day (BID) | ORAL | 0 refills | Status: DC

## 2021-04-23 NOTE — Progress Notes (Signed)
BP 113/72   Pulse 83   Temp 98.7 F (37.1 C) (Oral)   Wt 178 lb 9.6 oz (81 kg)   SpO2 98%   BMI 32.67 kg/m    Subjective:    Patient ID: Courtney Davidson, female    DOB: October 09, 1975, 45 y.o.   MRN: [id number]  HPI: Courtney Davidson is a 45 y.o. female  Chief Complaint  Patient presents with   Mood    Patient is here for follow on Mood. Patient denies having any concerns at today's visit.    DEPRESSION Continues on Prozac and Adderall.  Currently Adderall paid for out of pocket.  Adderall currently more for chronic fatigue syndrome, she has been on this for several years and does not wish to discontinue.  Lost job back in July 2020 and has not returned, no insurance.  Denies SI/HI.  Last filled Adderall on 04/02/21. Mood status: controlled Mood status: stable Satisfied with current treatment?: yes Symptom severity: moderate  Duration of current treatment : chronic Side effects: no Medication compliance: good compliance Psychotherapy/counseling: none Depressed mood: occasional Anxious mood: occasional Anhedonia: no Significant weight loss or gain: no Insomnia: yes hard to fall asleep Fatigue: yes Feelings of worthlessness or guilt: none Impaired concentration/indecisiveness: none Suicidal ideations: no Hopelessness: no Crying spells: none Depression screen [facility] 2/9 04/23/2021 10/22/2020 07/20/2020 04/17/2020 12/19/2019  Decreased Interest 0 0 0 0 0  Down, Depressed, Hopeless 0 0 0 0 0  PHQ - 2 Score 0 0 0 0 0  Altered sleeping 2 0 1 2 0  Tired, decreased energy 3 3 1 3 2   Change in appetite 0 0 0 0 0  Feeling bad or failure about yourself  0 0 0 0 1  Trouble concentrating 0 0 0 0 0  Moving slowly or fidgety/restless 0 0 0 0 0  Suicidal thoughts 0 0 0 0 0  PHQ-9 Score 5 3 2 5 3   Difficult doing work/chores Not difficult at all - - Somewhat difficult Somewhat difficult  Some recent data might be hidden   GAD 7 : Generalized Anxiety Score 10/22/2020 07/20/2020  04/17/2020 12/19/2019  Nervous, Anxious, on Edge 0 0 0 0  Control/stop worrying 0 3 0 0  Worry too much - different things 0 0 0 0  Trouble relaxing 0 0 0 0  Restless 0 0 0 0  Easily annoyed or irritable 3 0 3 3  Afraid - awful might happen 0 0 0 0  Total GAD 7 Score 3 3 3 3   Anxiety Difficulty - - Somewhat difficult Somewhat difficult    Relevant past medical, surgical, family and social history reviewed and updated as indicated. Interim medical history since our last visit reviewed. Allergies and medications reviewed and updated.  Review of Systems  Constitutional:  Positive for fatigue. Negative for activity change, appetite change, diaphoresis and fever.  Respiratory:  Negative for cough, chest tightness and shortness of breath.   Cardiovascular:  Negative for chest pain, palpitations and leg swelling.  Psychiatric/Behavioral:  Positive for decreased concentration and sleep disturbance. Negative for self-injury and suicidal ideas. The patient is nervous/anxious.    Per HPI unless specifically indicated above     Objective:    BP 113/72   Pulse 83   Temp 98.7 F (37.1 C) (Oral)   Wt 178 lb 9.6 oz (81 kg)   SpO2 98%   BMI 32.67 kg/m   Wt Readings from Last 3 Encounters:  04/23/21 178 lb 9.6  oz (81 kg)  10/22/20 171 lb 12.8 oz (77.9 kg)  04/17/20 171 lb 12.8 oz (77.9 kg)    Physical Exam Vitals and nursing note reviewed.  Constitutional:      General: She is awake. She is not in acute distress.    Appearance: She is well-developed. She is not ill-appearing.  HENT:     Head: Normocephalic.     Right Ear: Hearing normal.     Left Ear: Hearing normal.  Eyes:     General: Lids are normal.        Right eye: No discharge.        Left eye: No discharge.     Conjunctiva/sclera: Conjunctivae normal.     Pupils: Pupils are equal, round, and reactive to light.  Neck:     Vascular: No carotid bruit.  Cardiovascular:     Rate and Rhythm: Normal rate and regular rhythm.      Heart sounds: Normal heart sounds. No murmur heard.   No gallop.  Pulmonary:     Effort: Pulmonary effort is normal. No accessory muscle usage or respiratory distress.     Breath sounds: Normal breath sounds.  Abdominal:     General: Bowel sounds are normal.     Palpations: Abdomen is soft.  Musculoskeletal:     Cervical back: Normal range of motion and neck supple.     Right lower leg: No edema.     Left lower leg: No edema.  Skin:    General: Skin is warm and dry.  Neurological:     Mental Status: She is alert and oriented to person, place, and time.  Psychiatric:        Attention and Perception: Attention normal.        Mood and Affect: Mood normal.        Speech: Speech normal.        Behavior: Behavior normal. Behavior is cooperative.        Thought Content: Thought content normal.    Results for orders placed or performed in visit on 10/22/20  [id number] 11+Oxyco+Alc+Crt-Bund  Result Value Ref Range   Ethanol Negative Cutoff=0.020 %   Amphetamines, Urine See Final Results Cutoff=1000 ng/mL   Barbiturate Negative Cutoff=200 ng/mL   BENZODIAZ UR QL Negative Cutoff=200 ng/mL   Cannabinoid Quant, Ur Negative Cutoff=50 ng/mL   Cocaine (Metabolite) Negative Cutoff=300 ng/mL   OPIATE SCREEN URINE Negative Cutoff=300 ng/mL   Oxycodone/Oxymorphone, Urine Negative Cutoff=300 ng/mL   Phencyclidine Negative Cutoff=25 ng/mL   Methadone Screen, Urine Negative Cutoff=300 ng/mL   Propoxyphene Negative Cutoff=300 ng/mL   Meperidine Negative Cutoff=200 ng/mL   Tramadol Negative Cutoff=200 ng/mL   Creatinine 282.0 20.0 - 300.0 mg/dL   pH, Urine 5.7 4.5 - 8.9  Drug Profile [phone]  Result Value Ref Range   Amphetamines Positive (A) Cutoff=1000   Amphetamine Positive (A)    Amphetamine GC/MS Conf >3000 Cutoff=500 ng/mL   Methamphetamine Negative Cutoff=500      Assessment & Plan:   Problem List Items Addressed This Visit       Nervous and Auditory   CFS (chronic fatigue syndrome)     Chronic, ongoing.  Has been on long term Adderall which offers benefit.  Continue current regimen and refill monthly.  Obtain UDS next visit, controlled substance contract next visit.  Refill sent today dated for 05/03/21, 06/01/21, 07/03/21, and PDMP reviewed.  Return in 6 months -- will extend follow-up as patient minimal income and difficult to pay for every  3 month visits.        Other   Generalized anxiety disorder    Chronic, ongoing.  Continue current medication regimen.  Prozac, which benefits patient mood.  She denies SI/HI.  GAD score 3.      Nicotine dependence, cigarettes, uncomplicated    I have recommended complete cessation of tobacco use. I have discussed various options available for assistance with tobacco cessation including over the counter methods (Nicotine gum, patch and lozenges). We also discussed prescription options (Chantix, Nicotine Inhaler / Nasal Spray). The patient is not interested in pursuing any prescription tobacco cessation options at this time.      Depression, major, single episode, severe (HCC) - Primary    Chronic, ongoing.  Denies SI/HI.  Continue current medication regimen, Prozac, and adjust as needed.  She is not taking Lithium at this time due to cost, but scores have improved PHQ9 today is 5.  Continue to collaborate with [facility] team as needed.  At this time she denies any needs.  Return in 6 months.        Follow up plan: Return in about 6 months (around 10/22/2021) for MOOD and UDS + Contract needed.

## 2021-04-23 NOTE — Assessment & Plan Note (Signed)
Chronic, ongoing.  Continue current medication regimen.  Prozac, which benefits patient mood.  She denies SI/HI.  GAD score 3. ?

## 2021-04-23 NOTE — Assessment & Plan Note (Signed)
I have recommended complete cessation of tobacco use. I have discussed various options available for assistance with tobacco cessation including over the counter methods (Nicotine gum, patch and lozenges). We also discussed prescription options (Chantix, Nicotine Inhaler / Nasal Spray). The patient is not interested in pursuing any prescription tobacco cessation options at this time.  

## 2021-04-23 NOTE — Assessment & Plan Note (Addendum)
Chronic, ongoing.  Denies SI/HI.  Continue current medication regimen, Prozac, and adjust as needed.  She is not taking Lithium at this time due to cost, but scores have improved PHQ9 today is 5.  Continue to collaborate with [facility] team as needed.  At this time she denies any needs.  Return in 6 months.

## 2021-04-23 NOTE — Assessment & Plan Note (Signed)
Chronic, ongoing.  Has been on long term Adderall which offers benefit.  Continue current regimen and refill monthly.  Obtain UDS next visit, controlled substance contract next visit.  Refill sent today dated for 05/03/21, 06/01/21, 07/03/21, and PDMP reviewed.  Return in 6 months -- will extend follow-up as patient minimal income and difficult to pay for every 3 month visits.

## 2021-04-23 NOTE — Patient Instructions (Signed)

## 2021-07-08 ENCOUNTER — Telehealth: Payer: Self-pay | Admitting: Nurse Practitioner

## 2021-07-08 NOTE — Telephone Encounter (Signed)
FYI to provider

## 2021-07-08 NOTE — Telephone Encounter (Signed)
Noted, if needs refill sent in will do

## 2021-07-08 NOTE — Telephone Encounter (Signed)
Copied from CRM [id number]. Topic: General - Other >> Jul 08, 2021 12:35 PM Gaetana Michaelis A wrote: Reason for CRM: Lurena Joiner with the patient's pharmacy has called to share that their pharmacy was unable to fulfill the complete and total quantity requested for patient's amphetamine-dextroamphetamine (ADDERALL) 30 MG tablet [[id number]]   The patient was only able to receive 54 due to the backorder  Please contact further if needed

## 2021-08-02 ENCOUNTER — Other Ambulatory Visit: Payer: Self-pay | Admitting: Nurse Practitioner

## 2021-08-02 NOTE — Telephone Encounter (Signed)
Copied from CRM [id number]. Topic: Quick Communication - Rx Refill/Question >> Aug 02, 2021  3:32 PM Marylen Ponto wrote: Medication: amphetamine-dextroamphetamine (ADDERALL) 30 MG tablet  Has the patient contacted their pharmacy? Yes.  Pt told to contact provider (Agent: If no, request that the patient contact the pharmacy for the refill. If patient does not wish to contact the pharmacy document the reason why and proceed with request.) (Agent: If yes, when and what did the pharmacy advise?)  Preferred Pharmacy (with phone number or street name): [facility] [address] [place]), [place] - [address]  Phone: [phone] Fax: [phone]  Has the patient been seen for an appointment in the last year OR does the patient have an upcoming appointment? Yes.    Agent: Please be advised that RX refills may take up to 3 business days. We ask that you follow-up with your pharmacy.

## 2021-08-02 NOTE — Telephone Encounter (Signed)
Requested medication (s) are due for refill today: yes  Requested medication (s) are on the active medication list: yes  Last refill:  07/03/21 #60/0  Future visit scheduled: yes  Notes to clinic:  Unable to refill per protocol, cannot delegate.      Requested Prescriptions  Pending Prescriptions Disp Refills   amphetamine-dextroamphetamine (ADDERALL) 30 MG tablet 60 tablet 0    Sig: Take 1 tablet by mouth 2 (two) times daily.     Not Delegated - Psychiatry:  Stimulants/ADHD Failed - 08/02/2021  4:19 PM      Failed - This refill cannot be delegated      Passed - Urine Drug Screen completed in last 360 days      Passed - Last BP in normal range    BP Readings from Last 1 Encounters:  04/23/21 113/72          Passed - Last Heart Rate in normal range    Pulse Readings from Last 1 Encounters:  04/23/21 83          Passed - Valid encounter within last 6 months    Recent Outpatient Visits           3 months ago Depression, major, single episode, severe (HCC)   [facility] [name], [name], NP   9 months ago Depression, major, single episode, severe (HCC)   [facility] [name], [name], NP   1 year ago Depression, major, single episode, severe (HCC)   [facility] [name], [name], NP   1 year ago Depression, major, single episode, severe (HCC)   [facility] [name], [name], NP   1 year ago Depression, major, single episode, severe (HCC)   [facility] [name], [name], NP       Future Appointments             In 2 months [name], [name], NP [facility]

## 2021-08-05 MED ORDER — AMPHETAMINE-DEXTROAMPHETAMINE 30 MG PO TABS: 30.0000 mg | ORAL_TABLET | Freq: Two times a day (BID) | ORAL | 0 refills | Status: DC

## 2021-09-04 ENCOUNTER — Encounter: Payer: Self-pay | Admitting: Nurse Practitioner

## 2021-09-04 ENCOUNTER — Other Ambulatory Visit: Payer: Self-pay | Admitting: Nurse Practitioner

## 2021-09-04 MED ORDER — AMPHETAMINE-DEXTROAMPHETAMINE 30 MG PO TABS: 30.0000 mg | ORAL_TABLET | Freq: Two times a day (BID) | ORAL | 0 refills | Status: DC

## 2021-09-09 ENCOUNTER — Other Ambulatory Visit: Payer: Self-pay | Admitting: Nurse Practitioner

## 2021-09-09 MED ORDER — AMPHETAMINE-DEXTROAMPHETAMINE 30 MG PO TABS: 30.0000 mg | ORAL_TABLET | Freq: Two times a day (BID) | ORAL | 0 refills | Status: DC

## 2021-09-09 NOTE — Telephone Encounter (Signed)
Pt called in sating she is really needing this medication sent over to the pharmacy, and is getting a little frustrated. FYI ?

## 2021-09-09 NOTE — Telephone Encounter (Signed)
Medication Refill - Medication: amphetamine-dextroamphetamine (ADDERALL) 30 MG tablet ? ?Pt requesting medication be sent to different pharmacy as it is out of stock at her preferred pharmacy.  ? ?Has the patient contacted their pharmacy? Yes.   Contact PCP. ? ?(Agent: If yes, when and what did the pharmacy advise?) ? ?Preferred Pharmacy (with phone number or street name):  ?[id number] - [place], [place] - [address]  ?[address] [place] [address]  ?Phone: [phone] Fax: [phone]  ?Hours: Not open 24 hours  ? ? ?Has the patient been seen for an appointment in the last year OR does the patient have an upcoming appointment? Yes.   ? ?Agent: Please be advised that RX refills may take up to 3 business days. We ask that you follow-up with your pharmacy.  ?

## 2021-09-09 NOTE — Telephone Encounter (Signed)
Script provided to patient in office today. ?

## 2021-09-10 NOTE — Telephone Encounter (Signed)
Requested medication (s) are due for refill today: yes ? ?Requested medication (s) are on the active medication list: yes   ? ?Last refill: 04/23/21  #60  0 refills ? ?Future visit scheduled yes  10/23/21 ? ?Notes to clinic:Not delegated ? ?Requested Prescriptions  ?Pending Prescriptions Disp Refills  ? amphetamine-dextroamphetamine (ADDERALL) 30 MG tablet 60 tablet 0  ?  Sig: Take 1 tablet by mouth 2 (two) times daily.  ?  ? Not Delegated - Psychiatry:  Stimulants/ADHD Failed - 09/09/2021  5:18 PM  ?  ?  Failed - This refill cannot be delegated  ?  ?  Passed - Urine Drug Screen completed in last 360 days  ?  ?  Passed - Last BP in normal range  ?  BP Readings from Last 1 Encounters:  ?04/23/21 113/72  ?  ?  ?  ?  Passed - Last Heart Rate in normal range  ?  Pulse Readings from Last 1 Encounters:  ?04/23/21 83  ?  ?  ?  ?  Passed - Valid encounter within last 6 months  ?  Recent Outpatient Visits   ? ?      ? 4 months ago Depression, major, single episode, severe (HCC)  ? [facility] [place], [place] T, NP  ? 10 months ago Depression, major, single episode, severe (HCC)  ? [facility] [place], Leith T, NP  ? 1 year ago Depression, major, single episode, severe (HCC)  ? [facility] [place], Hardin T, NP  ? 1 year ago Depression, major, single episode, severe (HCC)  ? [facility] [place], [place] T, NP  ? 1 year ago Depression, major, single episode, severe (HCC)  ? [facility] [place], Corrie Dandy T, NP  ? ?  ?  ?Future Appointments   ? ?        ? In 1 month [name], [name], NP [facility]  ? ?  ? ?  ?  ?  ? ? ? ? ?

## 2021-09-10 NOTE — Telephone Encounter (Signed)
RX already written. Please refuse.  ?

## 2021-10-07 ENCOUNTER — Other Ambulatory Visit: Payer: Self-pay | Admitting: Nurse Practitioner

## 2021-10-08 ENCOUNTER — Other Ambulatory Visit: Payer: Self-pay | Admitting: Nurse Practitioner

## 2021-10-08 MED ORDER — AMPHETAMINE-DEXTROAMPHETAMINE 30 MG PO TABS: 30.0000 mg | ORAL_TABLET | Freq: Two times a day (BID) | ORAL | 0 refills | Status: DC

## 2021-10-20 NOTE — Patient Instructions (Signed)
Living With Attention Deficit Hyperactivity Disorder If you have been diagnosed with attention deficit hyperactivity disorder (ADHD), you may be relieved that you now know why you have felt or behaved a certain way. Still, you may feel overwhelmed about the treatment ahead. You may also wonder how to get the support you need and how to deal with the condition day-to-day. With treatment and support, you can live with ADHD and manage your symptoms. How to manage lifestyle changes Managing stress Stress is your body's reaction to life changes and events, both good and bad. To cope with the stress of an ADHD diagnosis, it may help to: Learn more about ADHD. Exercise regularly. Even a short daily walk can lower stress levels. Participate in training or education programs (including social skills training classes) that teach you to deal with symptoms.  Medicines Your health care provider may suggest certain medicines if he or she feels that they will help to improve your condition. Stimulant medicines are usually prescribed to treat ADHD, and therapy may also be prescribed. It is important to: Avoid using alcohol and other substances that may prevent your medicines from working properly (may interact). Talk with your pharmacist or health care provider about all the medicines that you take, their possible side effects, and what medicines are safe to take together. Make it your goal to take part in all treatment decisions (shared decision-making). Ask about possible side effects of medicines that your health care provider recommends, and tell him or her how you feel about having those side effects. It is best if shared decision-making with your health care provider is part of your total treatment plan. Relationships To strengthen your relationships with family members while treating your condition, consider taking part in family therapy. You might also attend self-help groups alone or with a loved one. Be  honest about how your symptoms affect your relationships. Make an effort to communicate respectfully instead of fighting, and find ways to show others that you care. Psychotherapy may be useful in helping you cope with how ADHD affects your relationships. How to recognize changes in your condition The following signs may mean that your treatment is working well and your condition is improving: Consistently being on time for appointments. Being more organized at home and work. Other people noticing improvements in your behavior. Achieving goals that you set for yourself. Thinking more clearly. The following signs may mean that your treatment is not working very well: Feeling impatience or more confusion. Missing, forgetting, or being late for appointments. An increasing sense of disorganization and messiness. More difficulty in reaching goals that you set for yourself. Loved ones becoming angry or frustrated with you. Follow these instructions at home: Take over-the-counter and prescription medicines only as told by your health care provider. Check with your health care provider before taking any new medicines. Create structure and an organized atmosphere at home. For example: Make a list of tasks, then rank them from most important to least important. Work on one task at a time until your listed tasks are done. Make a daily schedule and follow it consistently every day. Use an appointment calendar, and check it 2 or 3 times a day to keep on track. Keep it with you when you leave the house. Create spaces where you keep certain things, and always put things back in their places after you use them. Keep all follow-up visits as told by your health care provider. This is important. Where to find support Talking to others    Keep emotion out of important discussions and speak in a calm, logical way. Listen closely and patiently to your loved ones. Try to understand their point of view, and try to  avoid getting defensive. Take responsibility for the consequences of your actions. Ask that others do not take your behaviors personally. Aim to solve problems as they come up, and express your feelings instead of bottling them up. Talk openly about what you need from your loved ones and how they can support you. Consider going to family therapy sessions or having your family meet with a specialist who deals with ADHD-related behavior problems. Finances Not all insurance plans cover mental health care, so it is important to check with your insurance carrier. If paying for co-pays or counseling services is a problem, search for a local or county mental health care center. Public mental health care services may be offered there at a low cost or no cost when you are not able to see a private health care provider. If you are taking medicine for ADHD, you may be able to get the generic form, which may be less expensive than brand-name medicine. Some makers of prescription medicines also offer help to patients who cannot afford the medicines that they need. Questions to ask your health care provider: What are the risks and benefits of taking medicines? Would I benefit from therapy? How often should I follow up with a health care provider? Contact a health care provider if: You have side effects from your medicines, such as: Repeated muscle twitches, coughing, or speech outbursts. Sleep problems. Loss of appetite. Depression. New or worsening behavior problems. Dizziness. Unusually fast heartbeat. Stomach pains. Headaches. Get help right away if: You have a severe reaction to a medicine. Your behavior suddenly gets worse. Summary With treatment and support, you can live with ADHD and manage your symptoms. The medicines that are most often prescribed for ADHD are stimulants. Consider taking part in family therapy or self-help groups with family members or friends. When you talk with friends  and family about your ADHD, be patient and communicate openly. Take over-the-counter and prescription medicines only as told by your health care provider. Check with your health care provider before taking any new medicines. This information is not intended to replace advice given to you by your health care provider. Make sure you discuss any questions you have with your health care provider. Document Revised: 10/28/2019 Document Reviewed: 11/30/2019 Elsevier Patient Education  2023 Elsevier Inc.  

## 2021-10-22 ENCOUNTER — Ambulatory Visit: Payer: Self-pay | Admitting: Nurse Practitioner

## 2021-10-23 ENCOUNTER — Encounter: Payer: Self-pay | Admitting: Nurse Practitioner

## 2021-10-23 ENCOUNTER — Ambulatory Visit (INDEPENDENT_AMBULATORY_CARE_PROVIDER_SITE_OTHER): Payer: Self-pay | Admitting: Nurse Practitioner

## 2021-10-23 VITALS — BP 106/71 | HR 80 | Temp 98.4°F | Wt 177.4 lb

## 2021-10-23 DIAGNOSIS — G9332 Myalgic encephalomyelitis/chronic fatigue syndrome: Secondary | ICD-10-CM

## 2021-10-23 DIAGNOSIS — F322 Major depressive disorder, single episode, severe without psychotic features: Secondary | ICD-10-CM

## 2021-10-23 DIAGNOSIS — Z87891 Personal history of nicotine dependence: Secondary | ICD-10-CM

## 2021-10-23 DIAGNOSIS — F411 Generalized anxiety disorder: Secondary | ICD-10-CM

## 2021-10-23 DIAGNOSIS — Z79899 Other long term (current) drug therapy: Secondary | ICD-10-CM

## 2021-10-23 DIAGNOSIS — F17210 Nicotine dependence, cigarettes, uncomplicated: Secondary | ICD-10-CM

## 2021-10-23 MED ORDER — AMPHETAMINE-DEXTROAMPHETAMINE 30 MG PO TABS: 30.0000 mg | ORAL_TABLET | Freq: Two times a day (BID) | ORAL | 0 refills | Status: DC

## 2021-10-23 NOTE — Progress Notes (Signed)
? ?BP 106/71   Pulse 80   Temp 98.4 ?F (36.9 ?C) (Oral)   Wt 177 lb 6.4 oz (80.5 kg)   SpO2 97%   BMI 32.45 kg/m?   ? ?Subjective:  ? ? Patient ID: Courtney Davidson, female    DOB: 1975/12/16, 46 y.o.   MRN: [id number] ? ?HPI: ?Courtney Davidson is a 46 y.o. female ? ?Chief Complaint  ?Patient presents with  ? Mood  ?  Patient is here for follow up on her Mood. Patient denies having any concerns at today's visit.   ? ?DEPRESSION ?Continues on Prozac and Adderall.  Currently Adderall paid for out of pocket.  Adderall currently more for chronic fatigue syndrome, she has been on this for several years and does not wish to discontinue.  Continues not to work, no insurance.  Denies SI/HI. Last filled Adderall on 10/08/20 for 30 pills, this is all she could get as pharmacy did not have enough to supply 60.   ? ?Has not smoked in 2-3 years.  ?Mood status: controlled ?Mood status: stable ?Satisfied with current treatment?: yes ?Symptom severity: moderate  ?Duration of current treatment : chronic ?Side effects: no ?Medication compliance: good compliance ?Psychotherapy/counseling: none ?Depressed mood: occasional ?Anxious mood: occasional ?Anhedonia: no ?Significant weight loss or gain: no ?Insomnia: yes hard to fall asleep ?Fatigue: yes ?Feelings of worthlessness or guilt: none ?Impaired concentration/indecisiveness: none ?Suicidal ideations: no ?Hopelessness: no ?Crying spells: none ? ?  10/23/2021  ?  4:04 PM 04/23/2021  ?  4:19 PM 10/22/2020  ?  3:50 PM 07/20/2020  ?  3:57 PM 04/17/2020  ?  4:32 PM  ?Depression screen PHQ 2/9  ?Decreased Interest 0 0 0 0 0  ?Down, Depressed, Hopeless 1 0 0 0 0  ?PHQ - 2 Score 1 0 0 0 0  ?Altered sleeping 1 2 0 1 2  ?Tired, decreased energy 3 3 3 1 3   ?Change in appetite 1 0 0 0 0  ?Feeling bad or failure about yourself  0 0 0 0 0  ?Trouble concentrating 0 0 0 0 0  ?Moving slowly or fidgety/restless 0 0 0 0 0  ?Suicidal thoughts 0 0 0 0 0  ?PHQ-9 Score 6 5 3 2 5   ?Difficult doing  work/chores Not difficult at all Not difficult at all   Somewhat difficult  ? ? ?  10/23/2021  ?  4:04 PM 10/22/2020  ?  3:51 PM 07/20/2020  ?  3:58 PM 04/17/2020  ?  4:33 PM  ?GAD 7 : Generalized Anxiety Score  ?Nervous, Anxious, on Edge 0 0 0 0  ?Control/stop worrying 1 0 3 0  ?Worry too much - different things 1 0 0 0  ?Trouble relaxing 0 0 0 0  ?Restless 0 0 0 0  ?Easily annoyed or irritable 1 3 0 3  ?Afraid - awful might happen 0 0 0 0  ?Total GAD 7 Score 3 3 3 3   ?Anxiety Difficulty Somewhat difficult   Somewhat difficult  ? ? ?Relevant past medical, surgical, family and social history reviewed and updated as indicated. Interim medical history since our last visit reviewed. ?Allergies and medications reviewed and updated. ? ?Review of Systems  ?Constitutional:  Positive for fatigue. Negative for activity change, appetite change, diaphoresis and fever.  ?Respiratory:  Negative for cough, chest tightness and shortness of breath.   ?Cardiovascular:  Negative for chest pain, palpitations and leg swelling.  ?Psychiatric/Behavioral:  Positive for decreased concentration and sleep disturbance. Negative  for self-injury and suicidal ideas. The patient is nervous/anxious.   ? ?Per HPI unless specifically indicated above ? ?   ?Objective:  ?  ?BP 106/71   Pulse 80   Temp 98.4 ?F (36.9 ?C) (Oral)   Wt 177 lb 6.4 oz (80.5 kg)   SpO2 97%   BMI 32.45 kg/m?   ?Wt Readings from Last 3 Encounters:  ?10/23/21 177 lb 6.4 oz (80.5 kg)  ?04/23/21 178 lb 9.6 oz (81 kg)  ?10/22/20 171 lb 12.8 oz (77.9 kg)  ?  ?Physical Exam ?Vitals and nursing note reviewed.  ?Constitutional:   ?   General: She is awake. She is not in acute distress. ?   Appearance: She is well-developed. She is not ill-appearing.  ?HENT:  ?   Head: Normocephalic.  ?   Right Ear: Hearing normal.  ?   Left Ear: Hearing normal.  ?Eyes:  ?   General: Lids are normal.     ?   Right eye: No discharge.     ?   Left eye: No discharge.  ?   Conjunctiva/sclera: Conjunctivae  normal.  ?   Pupils: Pupils are equal, round, and reactive to light.  ?Neck:  ?   Vascular: No carotid bruit.  ?Cardiovascular:  ?   Rate and Rhythm: Normal rate and regular rhythm.  ?   Heart sounds: Normal heart sounds. No murmur heard. ?  No gallop.  ?Pulmonary:  ?   Effort: Pulmonary effort is normal. No accessory muscle usage or respiratory distress.  ?   Breath sounds: Normal breath sounds.  ?Abdominal:  ?   General: Bowel sounds are normal.  ?   Palpations: Abdomen is soft.  ?Musculoskeletal:  ?   Cervical back: Normal range of motion and neck supple.  ?   Right lower leg: No edema.  ?   Left lower leg: No edema.  ?Skin: ?   General: Skin is warm and dry.  ?Neurological:  ?   Mental Status: She is alert and oriented to person, place, and time.  ?Psychiatric:     ?   Attention and Perception: Attention normal.     ?   Mood and Affect: Mood normal.     ?   Speech: Speech normal.     ?   Behavior: Behavior normal. Behavior is cooperative.     ?   Thought Content: Thought content normal.  ? ?Results for orders placed or performed in visit on 10/22/20  ?[id number] 11+Oxyco+Alc+Crt-Bund  ?Result Value Ref Range  ? Ethanol Negative Cutoff=0.020 %  ? Amphetamines, Urine See Final Results Cutoff=1000 ng/mL  ? Barbiturate Negative Cutoff=200 ng/mL  ? BENZODIAZ UR QL Negative Cutoff=200 ng/mL  ? Cannabinoid Quant, Ur Negative Cutoff=50 ng/mL  ? Cocaine (Metabolite) Negative Cutoff=300 ng/mL  ? OPIATE SCREEN URINE Negative Cutoff=300 ng/mL  ? Oxycodone/Oxymorphone, Urine Negative Cutoff=300 ng/mL  ? Phencyclidine Negative Cutoff=25 ng/mL  ? Methadone Screen, Urine Negative Cutoff=300 ng/mL  ? Propoxyphene Negative Cutoff=300 ng/mL  ? Meperidine Negative Cutoff=200 ng/mL  ? Tramadol Negative Cutoff=200 ng/mL  ? Creatinine 282.0 20.0 - 300.0 mg/dL  ? pH, Urine 5.7 4.5 - 8.9  ?Drug Profile [id number]  ?Result Value Ref Range  ? Amphetamines Positive (A) Cutoff=1000  ? Amphetamine Positive (A)   ? Amphetamine GC/MS Conf >3000  Cutoff=500 ng/mL  ? Methamphetamine Negative Cutoff=500  ? ?   ?Assessment & Plan:  ? ?Problem List Items Addressed This Visit   ? ?  ? Nervous and  Auditory  ? CFS (chronic fatigue syndrome)  ?  Chronic, ongoing.  Has been on long term Adderall which offers benefit.  Continue current regimen and refill monthly.  Obtain UDS today, controlled substance contract next visit.  Refill printed today and PDMP reviewed. She is having difficulty finding pharmacy to fill prescription.  Return in 6 months -- will extend follow-up as patient minimal income and difficult to pay for every 3 month visits. ? ?  ?  ? Relevant Orders  ? [id number] 11+Oxyco+Alc+Crt-Bund  ?  ? Other  ? Depression, major, single episode, severe (HCC) - Primary  ?  Chronic, ongoing.  Denies SI/HI.  Continue current medication regimen, Prozac, and adjust as needed.  She is not taking Lithium at this time due to cost, but scores have improved PHQ9 today is 6.  Continue to collaborate with CCM team as needed.  At this time she denies any needs.  Return in 6 months. ? ?  ?  ? Former cigarette smoker  ?  Continue complete cessation of smoking. ? ?  ?  ? Generalized anxiety disorder  ?  Chronic, ongoing.  Continue current medication regimen.  Prozac, which benefits patient mood.  She denies SI/HI.  GAD score 3. ? ?  ?  ? Relevant Orders  ? [id number] 11+Oxyco+Alc+Crt-Bund  ? High risk medication use  ?  UDS today and controlled substance contract next visit. ? ?  ?  ? Relevant Orders  ? [id number] 11+Oxyco+Alc+Crt-Bund  ?  ? ?Follow up plan: ?Return in about 6 months (around 04/24/2022) for MOOD and need controlled subs contract. ?

## 2021-10-23 NOTE — Assessment & Plan Note (Signed)
Chronic, ongoing.  Denies SI/HI.  Continue current medication regimen, Prozac, and adjust as needed.  She is not taking Lithium at this time due to cost, but scores have improved PHQ9 today is 6.  Continue to collaborate with CCM team as needed.  At this time she denies any needs.  Return in 6 months. ?

## 2021-10-23 NOTE — Assessment & Plan Note (Signed)
Chronic, ongoing.  Continue current medication regimen.  Prozac, which benefits patient mood.  She denies SI/HI.  GAD score 3. ?

## 2021-10-23 NOTE — Assessment & Plan Note (Signed)
Continue complete cessation of smoking. 

## 2021-10-23 NOTE — Assessment & Plan Note (Addendum)
UDS today and controlled substance contract next visit. 

## 2021-10-23 NOTE — Assessment & Plan Note (Signed)
Chronic, ongoing.  Has been on long term Adderall which offers benefit.  Continue current regimen and refill monthly.  Obtain UDS today, controlled substance contract next visit.  Refill printed today and PDMP reviewed. She is having difficulty finding pharmacy to fill prescription.  Return in 6 months -- will extend follow-up as patient minimal income and difficult to pay for every 3 month visits. ?

## 2021-11-26 ENCOUNTER — Other Ambulatory Visit: Payer: Self-pay | Admitting: Nurse Practitioner

## 2021-11-26 ENCOUNTER — Other Ambulatory Visit: Payer: Self-pay

## 2021-11-26 MED ORDER — AMPHETAMINE-DEXTROAMPHETAMINE 30 MG PO TABS: 30.0000 mg | ORAL_TABLET | Freq: Two times a day (BID) | ORAL | 0 refills | Status: DC

## 2021-11-26 MED ORDER — AMPHETAMINE-DEXTROAMPHETAMINE 30 MG PO TABS
30.0000 mg | ORAL_TABLET | Freq: Two times a day (BID) | ORAL | 0 refills | Status: DC
  Filled 2021-11-26: qty 60, 30d supply, fill #0

## 2021-12-16 ENCOUNTER — Other Ambulatory Visit: Payer: Self-pay | Admitting: Nurse Practitioner

## 2021-12-17 NOTE — Telephone Encounter (Signed)
Requested Prescriptions  Pending Prescriptions Disp Refills  . FLUoxetine (PROZAC) 40 MG capsule [Pharmacy Med Name: FLUoxetine HCl 40 MG Oral Capsule] 90 capsule 0    Sig: Take 1 capsule by mouth once daily     Psychiatry:  Antidepressants - SSRI Passed - 12/16/2021  5:42 PM      Passed - Completed PHQ-2 or PHQ-9 in the last 360 days      Passed - Valid encounter within last 6 months    Recent Outpatient Visits          1 month ago Depression, major, single episode, severe (HCC)   [facility] [name], [name], NP   7 months ago Depression, major, single episode, severe (HCC)   [facility] [name], [name], NP   1 year ago Depression, major, single episode, severe (HCC)   [facility] [name], [name], NP   1 year ago Depression, major, single episode, severe (HCC)   [facility] [name], [name], NP   1 year ago Depression, major, single episode, severe (HCC)   [facility] [name], [name], NP      Future Appointments            In 4 months [name], [name], NP [facility]

## 2021-12-26 ENCOUNTER — Other Ambulatory Visit: Payer: Self-pay

## 2021-12-26 ENCOUNTER — Other Ambulatory Visit: Payer: Self-pay | Admitting: Nurse Practitioner

## 2021-12-26 MED ORDER — AMPHETAMINE-DEXTROAMPHETAMINE 30 MG PO TABS
30.0000 mg | ORAL_TABLET | Freq: Two times a day (BID) | ORAL | 0 refills | Status: DC
  Filled 2021-12-26: qty 60, 30d supply, fill #0

## 2021-12-26 NOTE — Telephone Encounter (Signed)
Requested medication (s) are due for refill today - yes  Requested medication (s) are on the active medication list -yes  Future visit scheduled -yes  Last refill: 11/26/21 #60   Notes to clinic: non delegated Rx  Requested Prescriptions  Pending Prescriptions Disp Refills   amphetamine-dextroamphetamine (ADDERALL) 30 MG tablet 60 tablet 0    Sig: Take 1 tablet by mouth 2 (two) times daily.     Not Delegated - Psychiatry:  Stimulants/ADHD Failed - 12/26/2021  1:31 PM      Failed - This refill cannot be delegated      Failed - Urine Drug Screen completed in last 360 days      Passed - Last BP in normal range    BP Readings from Last 1 Encounters:  10/23/21 106/71         Passed - Last Heart Rate in normal range    Pulse Readings from Last 1 Encounters:  10/23/21 80         Passed - Valid encounter within last 6 months    Recent Outpatient Visits           2 months ago Depression, major, single episode, severe (HCC)   [facility] [name], [name], NP   8 months ago Depression, major, single episode, severe (HCC)   [facility] [name], [name], NP   1 year ago Depression, major, single episode, severe (HCC)   [facility] [name], [name], NP   1 year ago Depression, major, single episode, severe (HCC)   [facility] [name], [name], NP   1 year ago Depression, major, single episode, severe (HCC)   [facility] [name], [name], NP       Future Appointments             In 4 months [name], [name], NP [facility]               Requested Prescriptions  Pending Prescriptions Disp Refills   amphetamine-dextroamphetamine (ADDERALL) 30 MG tablet 60 tablet 0    Sig: Take 1 tablet by mouth 2 (two) times daily.     Not Delegated - Psychiatry:  Stimulants/ADHD Failed - 12/26/2021  1:31 PM      Failed - This refill cannot be delegated      Failed - Urine Drug Screen completed  in last 360 days      Passed - Last BP in normal range    BP Readings from Last 1 Encounters:  10/23/21 106/71         Passed - Last Heart Rate in normal range    Pulse Readings from Last 1 Encounters:  10/23/21 80         Passed - Valid encounter within last 6 months    Recent Outpatient Visits           2 months ago Depression, major, single episode, severe (HCC)   [facility] [name], [name], NP   8 months ago Depression, major, single episode, severe (HCC)   [facility] [name], [name], NP   1 year ago Depression, major, single episode, severe (HCC)   [facility] [name], [name], NP   1 year ago Depression, major, single episode, severe (HCC)   [facility] [name], [name], NP   1 year ago Depression, major, single episode, severe (HCC)   [facility] [name], [name], NP  Future Appointments             In 4 months [name], [name], NP [facility]

## 2021-12-30 ENCOUNTER — Other Ambulatory Visit: Payer: Self-pay

## 2022-01-28 ENCOUNTER — Other Ambulatory Visit: Payer: Self-pay

## 2022-01-28 ENCOUNTER — Other Ambulatory Visit: Payer: Self-pay | Admitting: Nurse Practitioner

## 2022-01-29 ENCOUNTER — Other Ambulatory Visit: Payer: Self-pay

## 2022-01-29 MED FILL — Amphetamine-Dextroamphetamine Tab 30 MG: ORAL | 30 days supply | Qty: 60 | Fill #0 | Status: CN

## 2022-01-29 NOTE — Telephone Encounter (Signed)
Requested medication (s) are due for refill today: yes  Requested medication (s) are on the active medication list:yes  Last refill:  12/26/21  Future visit scheduled: yes  Notes to clinic:  Unable to refill per protocol, cannot delegate.      Requested Prescriptions  Pending Prescriptions Disp Refills   amphetamine-dextroamphetamine (ADDERALL) 30 MG tablet 60 tablet 0    Sig: Take 1 tablet by mouth 2 (two) times daily.     Not Delegated - Psychiatry:  Stimulants/ADHD Failed - 01/28/2022  9:45 AM      Failed - This refill cannot be delegated      Failed - Urine Drug Screen completed in last 360 days      Passed - Last BP in normal range    BP Readings from Last 1 Encounters:  10/23/21 106/71         Passed - Last Heart Rate in normal range    Pulse Readings from Last 1 Encounters:  10/23/21 80         Passed - Valid encounter within last 6 months    Recent Outpatient Visits           3 months ago Depression, major, single episode, severe (HCC)   [facility] [name], [name], NP   9 months ago Depression, major, single episode, severe (HCC)   [facility] [name], [name], NP   1 year ago Depression, major, single episode, severe (HCC)   [facility] [name], [name], NP   1 year ago Depression, major, single episode, severe (HCC)   [facility] [name], [name], NP   1 year ago Depression, major, single episode, severe (HCC)   [facility] [name], [name], NP       Future Appointments             In 2 months [name], [name], NP [facility]

## 2022-01-30 ENCOUNTER — Other Ambulatory Visit: Payer: Self-pay

## 2022-01-30 MED FILL — Amphetamine-Dextroamphetamine Tab 30 MG: ORAL | 30 days supply | Qty: 60 | Fill #0 | Status: AC

## 2022-02-26 ENCOUNTER — Other Ambulatory Visit: Payer: Self-pay | Admitting: Nurse Practitioner

## 2022-02-27 ENCOUNTER — Other Ambulatory Visit: Payer: Self-pay

## 2022-02-27 MED ORDER — AMPHETAMINE-DEXTROAMPHETAMINE 30 MG PO TABS
30.0000 mg | ORAL_TABLET | Freq: Two times a day (BID) | ORAL | 0 refills | Status: DC
  Filled 2022-02-28: qty 60, 30d supply, fill #0
  Filled ????-??-??: fill #0

## 2022-02-27 MED ORDER — AMPHETAMINE-DEXTROAMPHETAMINE 30 MG PO TABS: 1.0000 | ORAL_TABLET | Freq: Two times a day (BID) | ORAL | 0 refills | Status: DC

## 2022-02-27 NOTE — Telephone Encounter (Signed)
Requested medication (s) are due for refill today: yes  Requested medication (s) are on the active medication list: yes  Last refill:  01/29/22 #60  Future visit scheduled: yes  Notes to clinic:  med not delegated to NT to RF- pharmacy sent duplicate request for Adderall   Requested Prescriptions  Pending Prescriptions Disp Refills   amphetamine-dextroamphetamine (ADDERALL) 30 MG tablet 60 tablet 0    Sig: Take 1 tablet by mouth 2 (two) times daily.     Not Delegated - Psychiatry:  Stimulants/ADHD Failed - 02/26/2022  1:31 PM      Failed - This refill cannot be delegated      Failed - Urine Drug Screen completed in last 360 days      Passed - Last BP in normal range    BP Readings from Last 1 Encounters:  10/23/21 106/71         Passed - Last Heart Rate in normal range    Pulse Readings from Last 1 Encounters:  10/23/21 80         Passed - Valid encounter within last 6 months    Recent Outpatient Visits           4 months ago Depression, major, single episode, severe (HCC)   [facility] [name], [name], NP   10 months ago Depression, major, single episode, severe (HCC)   [facility] [name], [name], NP   1 year ago Depression, major, single episode, severe (HCC)   [facility] [name], [name], NP   1 year ago Depression, major, single episode, severe (HCC)   [facility] [name], [name], NP   1 year ago Depression, major, single episode, severe (HCC)   [facility] [name], [name], NP       Future Appointments             In 1 month [name], [name], NP [facility], [facility]             amphetamine-dextroamphetamine (ADDERALL) 30 MG tablet 60 tablet 0    Sig: Take 1 tablet by mouth 2 (two) times daily.     Not Delegated - Psychiatry:  Stimulants/ADHD Failed - 02/26/2022  1:31 PM      Failed - This refill cannot be delegated      Failed - Urine Drug Screen completed in last  360 days      Passed - Last BP in normal range    BP Readings from Last 1 Encounters:  10/23/21 106/71         Passed - Last Heart Rate in normal range    Pulse Readings from Last 1 Encounters:  10/23/21 80         Passed - Valid encounter within last 6 months    Recent Outpatient Visits           4 months ago Depression, major, single episode, severe (HCC)   [facility] [name], [name], NP   10 months ago Depression, major, single episode, severe (HCC)   [facility] [name], [name], NP   1 year ago Depression, major, single episode, severe (HCC)   [facility] [name], [name], NP   1 year ago Depression, major, single episode, severe (HCC)   [facility] [name], [name], NP   1 year ago Depression, major, single episode, severe (HCC)   [facility] [name], [name], NP  Future Appointments             In 1 month [name], [name], NP [facility], [facility]

## 2022-02-28 ENCOUNTER — Other Ambulatory Visit: Payer: Self-pay

## 2022-03-07 ENCOUNTER — Other Ambulatory Visit: Payer: Self-pay | Admitting: Nurse Practitioner

## 2022-03-10 NOTE — Telephone Encounter (Signed)
Requested Prescriptions  Pending Prescriptions Disp Refills  . FLUoxetine (PROZAC) 40 MG capsule [Pharmacy Med Name: FLUoxetine HCl 40 MG Oral Capsule] 90 capsule 0    Sig: Take 1 capsule by mouth once daily     Psychiatry:  Antidepressants - SSRI Passed - 03/07/2022  9:18 AM      Passed - Completed PHQ-2 or PHQ-9 in the last 360 days      Passed - Valid encounter within last 6 months    Recent Outpatient Visits          4 months ago Depression, major, single episode, severe (HCC)   [facility] [name], [name], NP   10 months ago Depression, major, single episode, severe (HCC)   [facility] [name], [name], NP   1 year ago Depression, major, single episode, severe (HCC)   [facility] [name], [name], NP   1 year ago Depression, major, single episode, severe (HCC)   [facility] [name], [name], NP   1 year ago Depression, major, single episode, severe (HCC)   [facility] [name], [name], NP      Future Appointments            In 1 month [name], [name], NP [facility], [facility]

## 2022-03-27 ENCOUNTER — Other Ambulatory Visit: Payer: Self-pay

## 2022-03-27 ENCOUNTER — Other Ambulatory Visit: Payer: Self-pay | Admitting: Nurse Practitioner

## 2022-03-27 MED ORDER — AMPHETAMINE-DEXTROAMPHETAMINE 30 MG PO TABS
30.0000 mg | ORAL_TABLET | Freq: Two times a day (BID) | ORAL | 0 refills | Status: DC
  Filled 2022-03-31: qty 60, 30d supply, fill #0

## 2022-03-27 NOTE — Telephone Encounter (Signed)
Requested medications are due for refill today.  yes  Requested medications are on the active medications list.  yes  Last refill. 03/31/2022  #60 0 rf  Future visit scheduled.   yes  Notes to clinic.  Pt already has a rf starting 03/31/2022. Refill not delegated.    Requested Prescriptions  Pending Prescriptions Disp Refills   amphetamine-dextroamphetamine (ADDERALL) 30 MG tablet 60 tablet 0    Sig: Take 1 tablet by mouth 2 (two) times daily.     Not Delegated - Psychiatry:  Stimulants/ADHD Failed - 03/27/2022  4:22 PM      Failed - This refill cannot be delegated      Failed - Urine Drug Screen completed in last 360 days      Passed - Last BP in normal range    BP Readings from Last 1 Encounters:  10/23/21 106/71         Passed - Last Heart Rate in normal range    Pulse Readings from Last 1 Encounters:  10/23/21 80         Passed - Valid encounter within last 6 months    Recent Outpatient Visits           5 months ago Depression, major, single episode, severe ([place])   [place], [name], NP   11 months ago Depression, major, single episode, severe ([place])   [place], [name], NP   1 year ago Depression, major, single episode, severe ([place])   [place], [name], NP   1 year ago Depression, major, single episode, severe ([place])   [place], [name], NP   1 year ago Depression, major, single episode, severe ([place])   [place], Barbaraann Faster, NP       Future Appointments             In 4 weeks [name], Barbaraann Faster, NP [facility], [facility]

## 2022-03-31 ENCOUNTER — Other Ambulatory Visit: Payer: Self-pay

## 2022-04-20 NOTE — Patient Instructions (Signed)

## 2022-04-25 ENCOUNTER — Encounter: Payer: Self-pay | Admitting: Nurse Practitioner

## 2022-04-25 ENCOUNTER — Ambulatory Visit (INDEPENDENT_AMBULATORY_CARE_PROVIDER_SITE_OTHER): Payer: Self-pay | Admitting: Nurse Practitioner

## 2022-04-25 ENCOUNTER — Other Ambulatory Visit: Payer: Self-pay

## 2022-04-25 VITALS — BP 116/66 | HR 77 | Temp 98.1°F | Ht 62.0 in | Wt 176.4 lb

## 2022-04-25 DIAGNOSIS — F322 Major depressive disorder, single episode, severe without psychotic features: Secondary | ICD-10-CM

## 2022-04-25 DIAGNOSIS — G9332 Myalgic encephalomyelitis/chronic fatigue syndrome: Secondary | ICD-10-CM

## 2022-04-25 DIAGNOSIS — F411 Generalized anxiety disorder: Secondary | ICD-10-CM

## 2022-04-25 DIAGNOSIS — Z79899 Other long term (current) drug therapy: Secondary | ICD-10-CM | POA: Insufficient documentation

## 2022-04-25 MED ORDER — AMPHETAMINE-DEXTROAMPHETAMINE 30 MG PO TABS
30.0000 mg | ORAL_TABLET | Freq: Two times a day (BID) | ORAL | 0 refills | Status: DC
  Filled ????-??-??: fill #0

## 2022-04-25 MED ORDER — AMPHETAMINE-DEXTROAMPHETAMINE 30 MG PO TABS
1.0000 | ORAL_TABLET | Freq: Two times a day (BID) | ORAL | 0 refills | Status: DC
  Filled 2022-07-01: qty 60, 30d supply, fill #0

## 2022-04-25 MED ORDER — AMPHETAMINE-DEXTROAMPHETAMINE 30 MG PO TABS
30.0000 mg | ORAL_TABLET | Freq: Two times a day (BID) | ORAL | 0 refills | Status: DC
  Filled 2022-06-02: qty 60, 30d supply, fill #0

## 2022-04-25 NOTE — Assessment & Plan Note (Signed)
Chronic, ongoing.  Denies SI/HI.  Continue current medication regimen, Prozac, and adjust as needed.  She is not taking Lithium at this time due to cost, but scores have improved PHQ9 today is 4.  Continue to collaborate with CCM team as needed.  At this time she denies any needs.  Return in 6 months.

## 2022-04-25 NOTE — Assessment & Plan Note (Signed)
Signed on 04/25/22 and reviewed with patient.

## 2022-04-25 NOTE — Assessment & Plan Note (Signed)
Chronic, ongoing.  Has been on Adderall for years, started by previous PCP.  Continue current regimen and refill monthly.  Obtain UDS next visit (April 2024), controlled substance contract obtain today.  Refill sent today dated for 05/01/21, 05/30/21, 06/30/21, and PDMP reviewed.  Return in 6 months -- will extend follow-up as patient minimal income and difficult to pay for every 3 month visits.

## 2022-04-25 NOTE — Progress Notes (Signed)
BP 116/66   Pulse 77   Temp 98.1 F (36.7 C) (Oral)   Ht 5\' 2"  (1.575 m)   Wt 176 lb 6.4 oz (80 kg)   SpO2 98%   BMI 32.26 kg/m    Subjective:    Patient ID: , female    DOB: 14-Aug-1975, 46 y.o.   MRN: [id number]  HPI: Courtney Davidson is a 46 y.o. female  Chief Complaint  Patient presents with   Mood    Patient is here for six month follow up on Mood. Patient will also need a Substance Controlled Contract at today's visit. Patient declines having any concerns at today's visit.    DEPRESSION Continues on Prozac and Adderall.  Currently Adderall pays out of pocket due to no insurance.  Adderall is for chronic fatigue syndrome, she has been on this for several years and does not wish to discontinue -- started by previous PCP.  Continues not to work, no insurance.  Denies SI/HI. Last filled Adderall on 03/31/22 for 30 pills, this is all she could get as pharmacy did not have enough to supply 60.    Has not smoked in 4 years.  Mood status: controlled Mood status: stable Satisfied with current treatment?: yes Symptom severity: moderate  Duration of current treatment : chronic Side effects: no Medication compliance: good compliance Psychotherapy/counseling: none Depressed mood: occasional Anxious mood: no Anhedonia: no Significant weight loss or gain: no Insomnia: no Fatigue: yes Feelings of worthlessness or guilt: no Impaired concentration/indecisiveness: no Suicidal ideations: no Hopelessness: no Crying spells: no    04/25/2022    3:01 PM 10/23/2021    4:04 PM 04/23/2021    4:19 PM 10/22/2020    3:50 PM 07/20/2020    3:57 PM  Depression screen PHQ 2/9  Decreased Interest 0 0 0 0 0  Down, Depressed, Hopeless 0 1 0 0 0  PHQ - 2 Score 0 1 0 0 0  Altered sleeping 2 1 2  0 1  Tired, decreased energy 2 3 3 3 1   Change in appetite 0 1 0 0 0  Feeling bad or failure about yourself  0 0 0 0 0  Trouble concentrating 0 0 0 0 0  Moving slowly or  fidgety/restless 0 0 0 0 0  Suicidal thoughts 0 0 0 0 0  PHQ-9 Score 4 6 5 3 2   Difficult doing work/chores  Not difficult at all Not difficult at all        04/25/2022    3:01 PM 10/23/2021    4:04 PM 10/22/2020    3:51 PM 07/20/2020    3:58 PM  GAD 7 : Generalized Anxiety Score  Nervous, Anxious, on Edge 0 0 0 0  Control/stop worrying 1 1 0 3  Worry too much - different things 0 1 0 0  Trouble relaxing 0 0 0 0  Restless 0 0 0 0  Easily annoyed or irritable 0 1 3 0  Afraid - awful might happen 0 0 0 0  Total GAD 7 Score 1 3 3 3   Anxiety Difficulty  Somewhat difficult      Relevant past medical, surgical, family and social history reviewed and updated as indicated. Interim medical history since our last visit reviewed. Allergies and medications reviewed and updated.  Review of Systems  Constitutional:  Positive for fatigue. Negative for activity change, appetite change, diaphoresis and fever.  Respiratory:  Negative for cough, chest tightness and shortness of breath.   Cardiovascular:  Negative for chest pain, palpitations and leg swelling.  Psychiatric/Behavioral:  Negative for decreased concentration, self-injury, sleep disturbance and suicidal ideas.     Per HPI unless specifically indicated above     Objective:    BP 116/66   Pulse 77   Temp 98.1 F (36.7 C) (Oral)   Ht 5\' 2"  (1.575 m)   Wt 176 lb 6.4 oz (80 kg)   SpO2 98%   BMI 32.26 kg/m   Wt Readings from Last 3 Encounters:  04/25/22 176 lb 6.4 oz (80 kg)  10/23/21 177 lb 6.4 oz (80.5 kg)  04/23/21 178 lb 9.6 oz (81 kg)    Physical Exam Vitals and nursing note reviewed.  Constitutional:      General: She is awake. She is not in acute distress.    Appearance: She is well-developed. She is not ill-appearing.  HENT:     Head: Normocephalic.     Right Ear: Hearing normal.     Left Ear: Hearing normal.  Eyes:     General: Lids are normal.        Right eye: No discharge.        Left eye: No discharge.      Conjunctiva/sclera: Conjunctivae normal.     Pupils: Pupils are equal, round, and reactive to light.  Neck:     Vascular: No carotid bruit.  Cardiovascular:     Rate and Rhythm: Normal rate and regular rhythm.     Heart sounds: Normal heart sounds. No murmur heard.    No gallop.  Pulmonary:     Effort: Pulmonary effort is normal. No accessory muscle usage or respiratory distress.     Breath sounds: Normal breath sounds.  Abdominal:     General: Bowel sounds are normal.     Palpations: Abdomen is soft.  Musculoskeletal:     Cervical back: Normal range of motion and neck supple.     Right lower leg: No edema.     Left lower leg: No edema.  Skin:    General: Skin is warm and dry.  Neurological:     Mental Status: She is alert and oriented to person, place, and time.  Psychiatric:        Attention and Perception: Attention normal.        Mood and Affect: Mood normal.        Speech: Speech normal.        Behavior: Behavior normal. Behavior is cooperative.        Thought Content: Thought content normal.    Results for orders placed or performed in visit on 10/22/20  [id number] 11+Oxyco+Alc+Crt-Bund  Result Value Ref Range   Ethanol Negative Cutoff=0.020 %   Amphetamines, Urine See Final Results Cutoff=1000 ng/mL   Barbiturate Negative Cutoff=200 ng/mL   BENZODIAZ UR QL Negative Cutoff=200 ng/mL   Cannabinoid Quant, Ur Negative Cutoff=50 ng/mL   Cocaine (Metabolite) Negative Cutoff=300 ng/mL   OPIATE SCREEN URINE Negative Cutoff=300 ng/mL   Oxycodone/Oxymorphone, Urine Negative Cutoff=300 ng/mL   Phencyclidine Negative Cutoff=25 ng/mL   Methadone Screen, Urine Negative Cutoff=300 ng/mL   Propoxyphene Negative Cutoff=300 ng/mL   Meperidine Negative Cutoff=200 ng/mL   Tramadol Negative Cutoff=200 ng/mL   Creatinine 282.0 20.0 - 300.0 mg/dL   pH, Urine 5.7 4.5 - 8.9  Drug Profile [id number]  Result Value Ref Range   Amphetamines Positive (A) Cutoff=1000   Amphetamine Positive (A)     Amphetamine GC/MS Conf >3000 Cutoff=500 ng/mL   Methamphetamine Negative Cutoff=500  Assessment & Plan:   Problem List Items Addressed This Visit       Nervous and Auditory   CFS (chronic fatigue syndrome)    Chronic, ongoing.  Has been on Adderall for years, started by previous PCP.  Continue current regimen and refill monthly.  Obtain UDS next visit (April 2024), controlled substance contract obtain today.  Refill sent today dated for 05/01/21, 05/30/21, 06/30/21, and PDMP reviewed.  Return in 6 months -- will extend follow-up as patient minimal income and difficult to pay for every 3 month visits.        Other   Controlled substance agreement signed    Signed on 04/25/22 and reviewed with patient.      Depression, major, single episode, severe (HCC) - Primary    Chronic, ongoing.  Denies SI/HI.  Continue current medication regimen, Prozac, and adjust as needed.  She is not taking Lithium at this time due to cost, but scores have improved PHQ9 today is 4.  Continue to collaborate with CCM team as needed.  At this time she denies any needs.  Return in 6 months.      Generalized anxiety disorder    Chronic, ongoing.  Continue current medication regimen.  Prozac, which benefits patient mood.  She denies SI/HI.  GAD score 1.        Follow up plan: Return in about 6 months (around 10/25/2022) for Chronic Fatigue,  Depression and Anxiety.

## 2022-04-25 NOTE — Assessment & Plan Note (Signed)
Chronic, ongoing.  Continue current medication regimen.  Prozac, which benefits patient mood.  She denies SI/HI.  GAD score 1.

## 2022-04-30 ENCOUNTER — Other Ambulatory Visit: Payer: Self-pay | Admitting: Nurse Practitioner

## 2022-04-30 ENCOUNTER — Other Ambulatory Visit: Payer: Self-pay

## 2022-04-30 MED ORDER — AMPHETAMINE-DEXTROAMPHETAMINE 30 MG PO TABS
30.0000 mg | ORAL_TABLET | Freq: Two times a day (BID) | ORAL | 0 refills | Status: DC
  Filled 2022-04-30: qty 60, 30d supply, fill #0

## 2022-04-30 NOTE — Telephone Encounter (Signed)
Medication Refill - Medication: amphetamine-dextroamphetamine (ADDERALL) 30 MG tablet  Has the patient contacted their pharmacy? Yes.    (Agent: If yes, when and what did the pharmacy advise?) can not be filled until 11-02, call office   Preferred Pharmacy (with phone number or street name):   [facility] Phone:  [phone]  Fax:  [phone]     Has the patient been seen for an appointment in the last year OR does the patient have an upcoming appt: Yes  Pt states filled day is incorrect, Pt is requesting a cb to discuss discrepancy

## 2022-04-30 NOTE — Telephone Encounter (Signed)
  Notes to clinic:  the reason the pt wants to pick this med up today is because her last rx for October was for #60 but there were 31 days so it should have been #62. She has taken her last one and would like to pick it up today. The pharm has it ready but holding it until tomorrow because that is the usual way they do it. Please inform pharm if they can give it to her today and if so also inform pt that she can get it today.      Requested Prescriptions  Pending Prescriptions Disp Refills   amphetamine-dextroamphetamine (ADDERALL) 30 MG tablet 60 tablet 0    Sig: Take 1 tablet by mouth 2 (two) times daily.     Not Delegated - Psychiatry:  Stimulants/ADHD Failed - 04/30/2022 12:14 PM      Failed - This refill cannot be delegated      Failed - Urine Drug Screen completed in last 360 days      Passed - Last BP in normal range    BP Readings from Last 1 Encounters:  04/25/22 116/66         Passed - Last Heart Rate in normal range    Pulse Readings from Last 1 Encounters:  04/25/22 77         Passed - Valid encounter within last 6 months    Recent Outpatient Visits           5 days ago Depression, major, single episode, severe ([place])   [place], [name], NP   6 months ago Depression, major, single episode, severe ([place])   [place], [name], NP   1 year ago Depression, major, single episode, severe ([place])   [place], [name], NP   1 year ago Depression, major, single episode, severe ([place])   [place], [name], NP   1 year ago Depression, major, single episode, severe ([place])   [place], Barbaraann Faster, NP       Future Appointments             In 6 months [name], Barbaraann Faster, NP [facility], [facility]

## 2022-06-02 ENCOUNTER — Other Ambulatory Visit: Payer: Self-pay

## 2022-06-10 ENCOUNTER — Other Ambulatory Visit: Payer: Self-pay | Admitting: Nurse Practitioner

## 2022-06-10 NOTE — Telephone Encounter (Signed)
Requested Prescriptions  Pending Prescriptions Disp Refills   FLUoxetine (PROZAC) 40 MG capsule [Pharmacy Med Name: FLUoxetine HCl 40 MG Oral Capsule] 90 capsule 0    Sig: Take 1 capsule by mouth once daily     Psychiatry:  Antidepressants - SSRI Passed - 06/10/2022  9:20 AM      Passed - Completed PHQ-2 or PHQ-9 in the last 360 days      Passed - Valid encounter within last 6 months    Recent Outpatient Visits           1 month ago Depression, major, single episode, severe (HCC)   [facility] [name], [name], NP   7 months ago Depression, major, single episode, severe (HCC)   [facility] [name], [name], NP   1 year ago Depression, major, single episode, severe (HCC)   [facility] [name], [name], NP   1 year ago Depression, major, single episode, severe (HCC)   [facility] [name], [name], NP   1 year ago Depression, major, single episode, severe (HCC)   [facility] [name], [name], NP       Future Appointments             In 4 months [name], [name], NP [facility], [facility]

## 2022-06-26 ENCOUNTER — Other Ambulatory Visit: Payer: Self-pay

## 2022-06-26 ENCOUNTER — Other Ambulatory Visit: Payer: Self-pay | Admitting: Nurse Practitioner

## 2022-07-01 ENCOUNTER — Other Ambulatory Visit: Payer: Self-pay

## 2022-07-01 ENCOUNTER — Other Ambulatory Visit: Payer: Self-pay | Admitting: Nurse Practitioner

## 2022-07-31 ENCOUNTER — Other Ambulatory Visit: Payer: Self-pay | Admitting: Nurse Practitioner

## 2022-07-31 ENCOUNTER — Other Ambulatory Visit: Payer: Self-pay

## 2022-07-31 MED ORDER — AMPHETAMINE-DEXTROAMPHETAMINE 30 MG PO TABS
1.0000 | ORAL_TABLET | Freq: Two times a day (BID) | ORAL | 0 refills | Status: DC
  Filled 2022-07-31: qty 60, 30d supply, fill #0

## 2022-07-31 MED ORDER — AMPHETAMINE-DEXTROAMPHETAMINE 30 MG PO TABS
30.0000 mg | ORAL_TABLET | Freq: Two times a day (BID) | ORAL | 0 refills | Status: DC
  Filled 2022-09-01: qty 60, 30d supply, fill #0

## 2022-07-31 NOTE — Telephone Encounter (Signed)
Requested medication (s) are due for refill today -yes  Requested medication (s) are on the active medication list -yes  Future visit scheduled -yes  Last refill: 11/1,12/1,1/1 #30  Notes to clinic: non delegated Rx   Requested Prescriptions  Pending Prescriptions Disp Refills   amphetamine-dextroamphetamine (ADDERALL) 30 MG tablet 60 tablet 0    Sig: Take 1 tablet by mouth 2 (two) times daily.     Not Delegated - Psychiatry:  Stimulants/ADHD Failed - 07/31/2022  9:38 AM      Failed - This refill cannot be delegated      Failed - Urine Drug Screen completed in last 360 days      Passed - Last BP in normal range    BP Readings from Last 1 Encounters:  04/25/22 116/66         Passed - Last Heart Rate in normal range    Pulse Readings from Last 1 Encounters:  04/25/22 77         Passed - Valid encounter within last 6 months    Recent Outpatient Visits           3 months ago Depression, major, single episode, severe ([place])   [place] Narc[place], [place] T, NP   9 months ago Depression, major, single episode, severe ([place])   [place], Henrine Screws T, NP   1 year ago Depression, major, single episode, severe ([place])   [place], Henrine Screws T, NP   1 year ago Depression, major, single episode, severe ([place])   [place], Henrine Screws T, NP   2 years ago Depression, major, single episode, severe ([place])   [place], Henrine Screws T, NP       Future Appointments             In 2 months [name], [place] T, NP [place]             amphetamine-dextroamphetamine (ADDERALL) 30 MG tablet 60 tablet 0    Sig: Take 1 tablet by mouth 2 (two) times daily.     Not Delegated - Psychiatry:  Stimulants/ADHD Failed - 07/31/2022  9:38 AM      Failed - This refill cannot be delegated      Failed - Urine Drug Screen  completed in last 360 days      Passed - Last BP in normal range    BP Readings from Last 1 Encounters:  04/25/22 116/66         Passed - Last Heart Rate in normal range    Pulse Readings from Last 1 Encounters:  04/25/22 77         Passed - Valid encounter within last 6 months    Recent Outpatient Visits           3 months ago Depression, major, single episode, severe ([place])   [place], [place] T, NP   9 months ago Depression, major, single episode, severe ([place])   [place], Henrine Screws T, NP   1 year ago Depression, major, single episode, severe ([place])   [place], Henrine Screws T, NP   1 year ago Depression, major, single episode, severe ([place])   [place], [place] T, NP   2 years ago Depression, major, single episode, severe ([place])   [facility] [place], Barbaraann Faster, NP       Future Appointments             In 2 months Venita Lick, NP [place], [facility]               Requested Prescriptions  Pending Prescriptions Disp Refills   amphetamine-dextroamphetamine (ADDERALL) 30 MG tablet 60 tablet 0    Sig: Take 1 tablet by mouth 2 (two) times daily.     Not Delegated - Psychiatry:  Stimulants/ADHD Failed - 07/31/2022  9:38 AM      Failed - This refill cannot be delegated      Failed - Urine Drug Screen completed in last 360 days      Passed - Last BP in normal range    BP Readings from Last 1 Encounters:  04/25/22 116/66         Passed - Last Heart Rate in normal range    Pulse Readings from Last 1 Encounters:  04/25/22 77         Passed - Valid encounter within last 6 months    Recent Outpatient Visits           3 months ago Depression, major, single episode, severe ([place])   [place], [place] T, NP   9 months ago Depression, major, single episode,  severe ([place])   [place], Henrine Screws T, NP   1 year ago Depression, major, single episode, severe ([place])   [place], Henrine Screws T, NP   1 year ago Depression, major, single episode, severe ([place])   [place], Henrine Screws T, NP   2 years ago Depression, major, single episode, severe ([place])   [place], Henrine Screws T, NP       Future Appointments             In 2 months [name], [place] T, NP [place]             amphetamine-dextroamphetamine (ADDERALL) 30 MG tablet 60 tablet 0    Sig: Take 1 tablet by mouth 2 (two) times daily.     Not Delegated - Psychiatry:  Stimulants/ADHD Failed - 07/31/2022  9:38 AM      Failed - This refill cannot be delegated      Failed - Urine Drug Screen completed in last 360 days      Passed - Last BP in normal range    BP Readings from Last 1 Encounters:  04/25/22 116/66         Passed - Last Heart Rate in normal range    Pulse Readings from Last 1 Encounters:  04/25/22 77         Passed - Valid encounter within last 6 months    Recent Outpatient Visits           3 months ago Depression, major, single episode, severe ([place])   [place], [place] T, NP   9 months ago Depression, major, single episode, severe ([place])   [place], Henrine Screws T, NP   1 year ago Depression, major, single episode, severe ([place])   [place], Henrine Screws T, NP   1 year ago Depression, major, single episode, severe ([place])   [place], Barbaraann Faster, NP  2 years ago Depression, major, single episode, severe ([employer])   [place], Barbaraann Faster, NP       Future Appointments             In 2 months [name], Barbaraann Faster, NP [place]

## 2022-08-29 ENCOUNTER — Other Ambulatory Visit: Payer: Self-pay | Admitting: Nurse Practitioner

## 2022-08-29 ENCOUNTER — Other Ambulatory Visit: Payer: Self-pay

## 2022-09-01 ENCOUNTER — Other Ambulatory Visit: Payer: Self-pay

## 2022-09-07 ENCOUNTER — Other Ambulatory Visit: Payer: Self-pay | Admitting: Nurse Practitioner

## 2022-09-08 NOTE — Telephone Encounter (Signed)
Requested medication (s) are due for refill today: yes  Requested medication (s) are on the active medication list: yes  Last refill:  06/10/22 #90 0 refills  Future visit scheduled: yes in 1 month  Notes to clinic:  no refills remain. Do you want to refill Rx?     Requested Prescriptions  Pending Prescriptions Disp Refills   FLUoxetine (PROZAC) 40 MG capsule [Pharmacy Med Name: FLUoxetine HCl 40 MG Oral Capsule] 90 capsule 0    Sig: Take 1 capsule by mouth once daily     Psychiatry:  Antidepressants - SSRI Passed - 09/07/2022 11:48 AM      Passed - Completed PHQ-2 or PHQ-9 in the last 360 days      Passed - Valid encounter within last 6 months    Recent Outpatient Visits           4 months ago Depression, major, single episode, severe ([place])   [place], [name], NP   10 months ago Depression, major, single episode, severe ([place])   [place], Henrine Screws T, NP   1 year ago Depression, major, single episode, severe ([place])   [place], Henrine Screws T, NP   1 year ago Depression, major, single episode, severe ([place])   [place], Henrine Screws T, NP   2 years ago Depression, major, single episode, severe ([place])   [place], Barbaraann Faster, NP       Future Appointments             In 1 month [name], Barbaraann Faster, NP [place], [facility]

## 2022-09-29 ENCOUNTER — Other Ambulatory Visit: Payer: Self-pay | Admitting: Nurse Practitioner

## 2022-09-30 ENCOUNTER — Other Ambulatory Visit: Payer: Self-pay

## 2022-09-30 MED ORDER — AMPHETAMINE-DEXTROAMPHETAMINE 30 MG PO TABS
30.0000 mg | ORAL_TABLET | Freq: Two times a day (BID) | ORAL | 0 refills | Status: DC
  Filled 2022-10-31: qty 60, 30d supply, fill #0

## 2022-09-30 MED ORDER — AMPHETAMINE-DEXTROAMPHETAMINE 30 MG PO TABS
1.0000 | ORAL_TABLET | Freq: Two times a day (BID) | ORAL | 0 refills | Status: DC
  Filled 2022-10-01: qty 60, 30d supply, fill #0

## 2022-09-30 NOTE — Telephone Encounter (Signed)
Requested medications are due for refill today.  Provider to determine  Requested medications are on the active medications list.  yes  Last refill. 08/30/2022 #60 0 rf  Future visit scheduled.   yes  Notes to clinic.  Refill not delegated.    Requested Prescriptions  Pending Prescriptions Disp Refills   amphetamine-dextroamphetamine (ADDERALL) 30 MG tablet 60 tablet 0    Sig: Take 1 tablet by mouth 2 (two) times daily.     Not Delegated - Psychiatry:  Stimulants/ADHD Failed - 09/29/2022  9:55 AM      Failed - This refill cannot be delegated      Failed - Urine Drug Screen completed in last 360 days      Passed - Last BP in normal range    BP Readings from Last 1 Encounters:  04/25/22 116/66         Passed - Last Heart Rate in normal range    Pulse Readings from Last 1 Encounters:  04/25/22 77         Passed - Valid encounter within last 6 months    Recent Outpatient Visits           5 months ago Depression, major, single episode, severe ([place])   [place], [place] T, NP   11 months ago Depression, major, single episode, severe ([place])   [place], Henrine Screws T, NP   1 year ago Depression, major, single episode, severe ([place])   [place], Henrine Screws T, NP   1 year ago Depression, major, single episode, severe ([place])   [place], Henrine Screws T, NP   2 years ago Depression, major, single episode, severe ([place])   [place], Henrine Screws T, NP       Future Appointments             In 3 weeks [name], Barbaraann Faster, NP [place]             amphetamine-dextroamphetamine (ADDERALL) 30 MG tablet 60 tablet 0    Sig: Take 1 tablet by mouth 2 (two) times daily.     Not Delegated - Psychiatry:  Stimulants/ADHD Failed - 09/29/2022  9:55 AM      Failed - This refill cannot be delegated       Failed - Urine Drug Screen completed in last 360 days      Passed - Last BP in normal range    BP Readings from Last 1 Encounters:  04/25/22 116/66         Passed - Last Heart Rate in normal range    Pulse Readings from Last 1 Encounters:  04/25/22 77         Passed - Valid encounter within last 6 months    Recent Outpatient Visits           5 months ago Depression, major, single episode, severe ([place])   [place], [place] T, NP   11 months ago Depression, major, single episode, severe ([place])   [place], Henrine Screws T, NP   1 year ago Depression, major, single episode, severe ([place])   [place], Henrine Screws T, NP   1 year ago Depression, major, single episode, severe ([place])   [place], Everson T, NP   2 years ago Depression, major, single  episode, severe ([place])   [place], Barbaraann Faster, NP       Future Appointments             In 3 weeks [name], Barbaraann Faster, NP [place]

## 2022-10-01 ENCOUNTER — Other Ambulatory Visit: Payer: Self-pay

## 2022-10-26 NOTE — Patient Instructions (Signed)
Managing Anxiety, Adult After being diagnosed with anxiety, you may be relieved to know why you have felt or behaved a certain way. You may also feel overwhelmed about the treatment ahead and what it will mean for your life. With care and support, you can manage your anxiety. How to manage lifestyle changes Understanding the difference between stress and anxiety Although stress can play a role in anxiety, it is not the same as anxiety. Stress is your body's reaction to life changes and events, both good and bad. Stress is often caused by something external, such as a deadline, test, or competition. It normally goes away after the event has ended and will last just a few hours. But, stress can be ongoing and can lead to more than just stress. Anxiety is caused by something internal, such as imagining a terrible outcome or worrying that something will go wrong that will greatly upset you. Anxiety often does not go away even after the event is over, and it can become a long-term (chronic) worry. Lowering stress and anxiety Talk with your health care provider or a counselor to learn more about lowering anxiety and stress. They may suggest tension-reduction techniques, such as: Music. Spend time creating or listening to music that you enjoy and that inspires you. Mindfulness-based meditation. Practice being aware of your normal breaths while not trying to control your breathing. It can be done while sitting or walking. Centering prayer. Focus on a word, phrase, or sacred image that means something to you and brings you peace. Deep breathing. Expand your stomach and inhale slowly through your nose. Hold your breath for 3-5 seconds. Then breathe out slowly, letting your stomach muscles relax. Self-talk. Learn to notice and spot thought patterns that lead to anxiety reactions. Change those patterns to thoughts that feel peaceful. Muscle relaxation. Take time to tense muscles and then relax them. Choose a  tension-reduction technique that fits your lifestyle and personality. These techniques take time and practice. Set aside 5-15 minutes a day to do them. Specialized therapists can offer counseling and training in these techniques. The training to help with anxiety may be covered by some insurance plans. Other things you can do to manage stress and anxiety include: Keeping a stress diary. This can help you learn what triggers your reaction and then learn ways to manage your response. Thinking about how you react to certain situations. You may not be able to control everything, but you can control your response. Making time for activities that help you relax and not feeling guilty about spending your time in this way. Doing visual imagery. This involves imagining or creating mental pictures to help you relax. Practicing yoga. Through yoga poses, you can lower tension and relax.  Medicines Medicines for anxiety include: Antidepressant medicines. These are usually prescribed for long-term daily control. Anti-anxiety medicines. These may be added in severe cases, especially when panic attacks occur. When used together, medicines, psychotherapy, and tension-reduction techniques may be the most effective treatment. Relationships Relationships can play a big part in helping you recover. Spend more time connecting with trusted friends and family members. Think about going to couples counseling if you have a partner, taking family education classes, or going to family therapy. Therapy can help you and others better understand your anxiety. How to recognize changes in your anxiety Everyone responds differently to treatment for anxiety. Recovery from anxiety happens when symptoms lessen and stop interfering with your daily life at home or work. This may mean that you   will start to: Have better concentration and focus. Worry will interfere less in your daily thinking. Sleep better. Be less irritable. Have more  energy. Have improved memory. Try to recognize when your condition is getting worse. Contact your provider if your symptoms interfere with home or work and you feel like your condition is not improving. Follow these instructions at home: Activity Exercise. Adults should: Exercise for at least 150 minutes each week. The exercise should increase your heart rate and make you sweat (moderate-intensity exercise). Do strengthening exercises at least twice a week. Get the right amount and quality of sleep. Most adults need 7-9 hours of sleep each night. Lifestyle  Eat a healthy diet that includes plenty of vegetables, fruits, whole grains, low-fat dairy products, and lean protein. Do not eat a lot of foods that are high in fats, added sugars, or salt (sodium). Make choices that simplify your life. Do not use any products that contain nicotine or tobacco. These products include cigarettes, chewing tobacco, and vaping devices, such as e-cigarettes. If you need help quitting, ask your provider. Avoid caffeine, alcohol, and certain over-the-counter cold medicines. These may make you feel worse. Ask your pharmacist which medicines to avoid. General instructions Take over-the-counter and prescription medicines only as told by your provider. Keep all follow-up visits. This is to make sure you are managing your anxiety well or if you need more support. Where to find support You can get help and support from: Self-help groups. Online and community organizations. A trusted spiritual leader. Couples counseling. Family education classes. Family therapy. Where to find more information You may find that joining a support group helps you deal with your anxiety. The following sources can help you find counselors or support groups near you: Mental Health America: mentalhealthamerica.net Anxiety and Depression Association of America (ADAA): adaa.org National Alliance on Mental Illness (NAMI): nami.org Contact  a health care provider if: You have a hard time staying focused or finishing tasks. You spend many hours a day feeling worried about everyday life. You are very tired because you cannot stop worrying. You start to have headaches or often feel tense. You have chronic nausea or diarrhea. Get help right away if: Your heart feels like it is racing. You have shortness of breath. You have thoughts of hurting yourself or others. Get help right away if you feel like you may hurt yourself or others, or have thoughts about taking your own life. Go to your nearest emergency room or: Call 911. Call the National Suicide Prevention Lifeline at 1-800-273-8255 or 988. This is open 24 hours a day. Text the Crisis Text Line at 741741. This information is not intended to replace advice given to you by your health care provider. Make sure you discuss any questions you have with your health care provider. Document Revised: 03/25/2022 Document Reviewed: 10/07/2020 Elsevier Patient Education  2023 Elsevier Inc.  

## 2022-10-27 ENCOUNTER — Ambulatory Visit (INDEPENDENT_AMBULATORY_CARE_PROVIDER_SITE_OTHER): Payer: Self-pay | Admitting: Nurse Practitioner

## 2022-10-27 VITALS — BP 115/77 | HR 88 | Temp 98.4°F | Ht 62.01 in | Wt 178.6 lb

## 2022-10-27 DIAGNOSIS — Z599 Problem related to housing and economic circumstances, unspecified: Secondary | ICD-10-CM

## 2022-10-27 DIAGNOSIS — F411 Generalized anxiety disorder: Secondary | ICD-10-CM

## 2022-10-27 DIAGNOSIS — F322 Major depressive disorder, single episode, severe without psychotic features: Secondary | ICD-10-CM

## 2022-10-27 DIAGNOSIS — G9332 Myalgic encephalomyelitis/chronic fatigue syndrome: Secondary | ICD-10-CM

## 2022-10-27 MED ORDER — AMPHETAMINE-DEXTROAMPHETAMINE 30 MG PO TABS: 1.0000 | ORAL_TABLET | Freq: Two times a day (BID) | ORAL | 0 refills | Status: DC

## 2022-10-27 MED ORDER — AMPHETAMINE-DEXTROAMPHETAMINE 30 MG PO TABS: 30.0000 mg | ORAL_TABLET | Freq: Two times a day (BID) | ORAL | 0 refills | Status: DC

## 2022-10-27 NOTE — Progress Notes (Signed)
BP 115/77   Pulse 88   Temp 98.4 F (36.9 C) (Oral)   Ht 5' 2.01" (1.575 m)   Wt 178 lb 9.6 oz (81 kg)   SpO2 96%   BMI 32.66 kg/m    Subjective:    Patient ID: Courtney Davidson, female    DOB: 02/12/1976, 47 y.o.   MRN: [id number]  HPI: Courtney Davidson is a 47 y.o. female  Chief Complaint  Patient presents with   Chronic Fatigue   Mood   Depression   Anxiety   DEPRESSION Continues on Prozac and Adderall.  Currently Adderall paid for out of pocket.  Adderall currently more for chronic fatigue syndrome, she has been on this for several years.  Continues not to work, no insurance.  Denies SI/HI. Last filled Adderall on 10/01/22 for 60 pills.  Has not smoked in 2-3 years.  Mood status: controlled Mood status: stable Satisfied with current treatment?: yes Symptom severity: moderate  Duration of current treatment : chronic Side effects: no Medication compliance: good compliance Psychotherapy/counseling: none Depressed mood: occasional Anxious mood: occasional Anhedonia: no Significant weight loss or gain: no Insomnia: yes hard to fall asleep Fatigue: yes Feelings of worthlessness or guilt: none Impaired concentration/indecisiveness: none Suicidal ideations: no Hopelessness: no Crying spells: none    10/27/2022    3:49 PM 04/25/2022    3:01 PM 10/23/2021    4:04 PM 04/23/2021    4:19 PM 10/22/2020    3:50 PM  Depression screen PHQ 2/9  Decreased Interest 0 0 0 0 0  Down, Depressed, Hopeless 0 0 1 0 0  PHQ - 2 Score 0 0 1 0 0  Altered sleeping 2 2 1 2  0  Tired, decreased energy 2 2 3 3 3   Change in appetite 0 0 1 0 0  Feeling bad or failure about yourself  0 0 0 0 0  Trouble concentrating 0 0 0 0 0  Moving slowly or fidgety/restless 0 0 0 0 0  Suicidal thoughts 0 0 0 0 0  PHQ-9 Score 4 4 6 5 3   Difficult doing work/chores   Not difficult at all Not difficult at all       10/27/2022    3:49 PM 04/25/2022    3:01 PM 10/23/2021    4:04 PM 10/22/2020    3:51  PM  GAD 7 : Generalized Anxiety Score  Nervous, Anxious, on Edge 1 0 0 0  Control/stop worrying 1 1 1  0  Worry too much - different things 0 0 1 0  Trouble relaxing 0 0 0 0  Restless 0 0 0 0  Easily annoyed or irritable 2 0 1 3  Afraid - awful might happen 0 0 0 0  Total GAD 7 Score 4 1 3 3   Anxiety Difficulty Not difficult at all  Somewhat difficult     Relevant past medical, surgical, family and social history reviewed and updated as indicated. Interim medical history since our last visit reviewed. Allergies and medications reviewed and updated.  Review of Systems  Constitutional:  Positive for fatigue. Negative for activity change, appetite change, diaphoresis and fever.  Respiratory:  Negative for cough, chest tightness and shortness of breath.   Cardiovascular:  Negative for chest pain, palpitations and leg swelling.  Psychiatric/Behavioral:  Positive for decreased concentration and sleep disturbance. Negative for self-injury and suicidal ideas. The patient is nervous/anxious.    Per HPI unless specifically indicated above     Objective:  BP 115/77   Pulse 88   Temp 98.4 F (36.9 C) (Oral)   Ht 5' 2.01" (1.575 m)   Wt 178 lb 9.6 oz (81 kg)   SpO2 96%   BMI 32.66 kg/m   Wt Readings from Last 3 Encounters:  10/27/22 178 lb 9.6 oz (81 kg)  04/25/22 176 lb 6.4 oz (80 kg)  10/23/21 177 lb 6.4 oz (80.5 kg)    Physical Exam Vitals and nursing note reviewed.  Constitutional:      General: She is awake. She is not in acute distress.    Appearance: She is well-developed. She is not ill-appearing.  HENT:     Head: Normocephalic.     Right Ear: Hearing normal.     Left Ear: Hearing normal.  Eyes:     General: Lids are normal.        Right eye: No discharge.        Left eye: No discharge.     Conjunctiva/sclera: Conjunctivae normal.     Pupils: Pupils are equal, round, and reactive to light.  Neck:     Vascular: No carotid bruit.  Cardiovascular:     Rate and  Rhythm: Normal rate and regular rhythm.     Heart sounds: Normal heart sounds. No murmur heard.    No gallop.  Pulmonary:     Effort: Pulmonary effort is normal. No accessory muscle usage or respiratory distress.     Breath sounds: Normal breath sounds.  Abdominal:     General: Bowel sounds are normal.     Palpations: Abdomen is soft.  Musculoskeletal:     Cervical back: Normal range of motion and neck supple.     Right lower leg: No edema.     Left lower leg: No edema.  Skin:    General: Skin is warm and dry.  Neurological:     Mental Status: She is alert and oriented to person, place, and time.  Psychiatric:        Attention and Perception: Attention normal.        Mood and Affect: Mood normal.        Speech: Speech normal.        Behavior: Behavior normal. Behavior is cooperative.        Thought Content: Thought content normal.    Results for orders placed or performed in visit on 10/22/20  [id number] 11+Oxyco+Alc+Crt-Bund  Result Value Ref Range   Ethanol Negative Cutoff=0.020 %   Amphetamines, Urine See Final Results Cutoff=1000 ng/mL   Barbiturate Negative Cutoff=200 ng/mL   BENZODIAZ UR QL Negative Cutoff=200 ng/mL   Cannabinoid Quant, Ur Negative Cutoff=50 ng/mL   Cocaine (Metabolite) Negative Cutoff=300 ng/mL   OPIATE SCREEN URINE Negative Cutoff=300 ng/mL   Oxycodone/Oxymorphone, Urine Negative Cutoff=300 ng/mL   Phencyclidine Negative Cutoff=25 ng/mL   Methadone Screen, Urine Negative Cutoff=300 ng/mL   Propoxyphene Negative Cutoff=300 ng/mL   Meperidine Negative Cutoff=200 ng/mL   Tramadol Negative Cutoff=200 ng/mL   Creatinine 282.0 20.0 - 300.0 mg/dL   pH, Urine 5.7 4.5 - 8.9  Drug Profile [phone]  Result Value Ref Range   Amphetamines Positive (A) Cutoff=1000   Amphetamine Positive (A)    Amphetamine GC/MS Conf >3000 Cutoff=500 ng/mL   Methamphetamine Negative Cutoff=500      Assessment & Plan:   Problem List Items Addressed This Visit       Nervous  and Auditory   CFS (chronic fatigue syndrome)    Chronic, ongoing.  Has been on Adderall  for years, started by previous PCP.  Continue current regimen and refill monthly.  Obtain UDS next visit (due April 2024, but will wait to see if we can get Medicaid coverage), controlled substance contract up to date.  Refills sent today and PDMP reviewed.  Return in 6 months -- will extend follow-up as patient minimal income and difficult to pay for every 3 month visits.  Referral to SW to see if we can work on [employer] coverage for her with new qualifications, her monthly income is <$1732 as single person.         Other   Depression, major, single episode, severe (HCC) - Primary    Chronic, ongoing.  Denies SI/HI.  Continue current medication regimen, Prozac, and adjust as needed.  She is not taking Lithium at this time due to cost, but scores stable.  PHQ9 today is 4.  Referral to SW to see if we can work on [employer] coverage for her with new qualifications, her monthly income is <$1732 as single person.  Return in 6 months.      Relevant Orders   Ambulatory referral to Social Work   Financial difficulties     Referral to SW to see if we can work on [employer] coverage for her with new qualifications, her monthly income is <$1732 as single person.       Relevant Orders   Ambulatory referral to Social Work   Generalized anxiety disorder    Chronic, ongoing.  Refer to depression plan of care.      Relevant Orders   Ambulatory referral to Social Work     Follow up plan: Return in about 6 months (around 04/28/2023) for MOOD.

## 2022-10-27 NOTE — Assessment & Plan Note (Signed)
Chronic, ongoing.  Denies SI/HI.  Continue current medication regimen, Prozac, and adjust as needed.  She is not taking Lithium at this time due to cost, but scores stable.  PHQ9 today is 4.  Referral to SW to see if we can work on [employer] coverage for her with new qualifications, her monthly income is <$1732 as single person.  Return in 6 months.

## 2022-10-27 NOTE — Assessment & Plan Note (Signed)
Chronic, ongoing.  Has been on Adderall for years, started by previous PCP.  Continue current regimen and refill monthly.  Obtain UDS next visit (due April 2024, but will wait to see if we can get Medicaid coverage), controlled substance contract up to date.  Refills sent today and PDMP reviewed.  Return in 6 months -- will extend follow-up as patient minimal income and difficult to pay for every 3 month visits.  Referral to SW to see if we can work on [employer] coverage for her with new qualifications, her monthly income is <$1732 as single person.

## 2022-10-27 NOTE — Assessment & Plan Note (Signed)
Chronic, ongoing.  Refer to depression plan of care.

## 2022-10-27 NOTE — Assessment & Plan Note (Signed)
Referral to SW to see if we can work on [employer] coverage for her with new qualifications, her monthly income is <$1732 as single person.

## 2022-10-31 ENCOUNTER — Other Ambulatory Visit: Payer: Self-pay

## 2022-11-06 ENCOUNTER — Other Ambulatory Visit: Payer: Self-pay

## 2022-11-06 NOTE — Patient Instructions (Signed)
Visit Information  The Patient                                              was given information about Medicaid Managed Care team care coordination services and consented to engagement with the Medicaid Managed Care team.   Social Worker will follow up in 30 days.   Valma Rotenberg, BSW, MHA [place]  Managed Medicaid Social Worker [phone]  

## 2022-11-06 NOTE — Patient Outreach (Signed)
Medicaid Managed Care Social Work Note  11/06/2022 Name:  JAELY PLACKE MRN:  [id number] DOB:  09-16-1975  [name] is an 47 y.o. year old female who is a primary patient of [name], [name], NP.  The Medicaid Managed Care Coordination team was consulted for assistance with:  Community Resources   Ms. Kerrigan was given information about Medicaid Managed Care Coordination team services today. [name] Patient agreed to services and verbal consent obtained.  Engaged with patient  for by telephone forinitial visit in response to referral for case management and/or care coordination services.   Assessments/Interventions:  Review of past medical history, allergies, medications, health status, including review of consultants reports, laboratory and other test data, was performed as part of comprehensive evaluation and provision of chronic care management services.  SDOH: (Social Determinant of Health) assessments and interventions performed: SDOH Interventions    Flowsheet Row Office Visit from 04/17/2020 in [facility] Video Visit from 12/19/2019 in [facility] Office Visit from 07/29/2019 in [facility] Office Visit from 12/28/2018 in [facility]  SDOH Interventions      Depression Interventions/Treatment  Currently on Treatment Currently on Treatment Currently on Treatment, Medication Currently on Treatment     BSW completed a telephone outreach with patient, she states her last day of working was 11/05/22, she does not have any other income and has not located a new job. Patient does not receive foodstamps. Patient is not behind on her utilities and she owns her home. BSW will provide patient with resources for medicaid and foodstamps, utilities and employment options.  Advanced Directives Status:  Not addressed in this encounter.  Care Plan                 No Known  Allergies  Medications Reviewed Today     Reviewed by Marjie Skiff, NP (Nurse Practitioner) on 10/27/22 at 1605  Med List Status: <None>   Medication Order Taking? Sig Documenting Provider Last Dose Status Informant  amphetamine-dextroamphetamine (ADDERALL) 30 MG tablet [id number] Yes Take 1 tablet by mouth 2 (two) times daily. Aura Dials T, NP Taking Active   amphetamine-dextroamphetamine (ADDERALL) 30 MG tablet [id number] Yes Take 1 tablet by mouth 2 (two) times daily. Aura Dials T, NP Taking Active   amphetamine-dextroamphetamine (ADDERALL) 30 MG tablet [id number] Yes Take 1 tablet by mouth 2 (two) times daily. Aura Dials T, NP Taking Active   diclofenac Sodium (VOLTAREN) 1 % GEL [id number] Yes Apply 2 g topically 3 (three) times daily. Aura Dials T, NP Taking Active   FLUoxetine (PROZAC) 40 MG capsule [id number] Yes Take 1 capsule by mouth once daily [name], [name], NP Taking Active   naproxen sodium (ALEVE) 220 MG tablet [id number] Yes Take 220 mg by mouth 2 (two) times daily as needed. [provider] Taking Active Self            Patient Active Problem List   Diagnosis Date Noted   Controlled substance agreement signed 04/25/2022   High risk medication use 10/22/2020   Menorrhagia 04/17/2020   Financial difficulties 12/19/2019   Chronic pain of both knees 11/16/2018   Chronic pain of both shoulders 11/16/2018   Pulmonary nodules 06/10/2018   Depression, major, single episode, severe (HCC) 10/06/2016   Former cigarette smoker 09/03/2016   Cholelithiasis 01/15/2016   Carpal tunnel syndrome 06/18/2015   Generalized anxiety disorder 06/18/2015   CFS (chronic fatigue syndrome)  06/18/2015    Conditions to be addressed/monitored per PCP order:   financial resources  There are no care plans that you recently modified to display for this patient.   Follow up:  Patient agrees to Care Plan and Follow-up.  Plan: The Managed Medicaid care  management team will reach out to the patient again over the next 30 days.  Date/time of next scheduled Social Work care management/care coordination outreach:  12/09/22  Gus Puma, Kenard Gower, [facility]  Managed [facility] Social Worker [phone]

## 2022-12-01 ENCOUNTER — Other Ambulatory Visit: Payer: Self-pay

## 2022-12-08 ENCOUNTER — Ambulatory Visit
Admission: EM | Admit: 2022-12-08 | Discharge: 2022-12-08 | Disposition: A | Discharge status: Home / Self Care | Payer: Self-pay | Attending: Emergency Medicine | Admitting: Emergency Medicine

## 2022-12-08 DIAGNOSIS — S61012A Laceration without foreign body of left thumb without damage to nail, initial encounter: Secondary | ICD-10-CM

## 2022-12-08 DIAGNOSIS — Z23 Encounter for immunization: Secondary | ICD-10-CM

## 2022-12-08 MED ORDER — TETANUS-DIPHTH-ACELL PERTUSSIS 5-2.5-18.5 LF-MCG/0.5 IM SUSY
0.5000 mL | PREFILLED_SYRINGE | Freq: Once | INTRAMUSCULAR | Status: AC
  Administered 2022-12-08: 0.5 mL via INTRAMUSCULAR

## 2022-12-08 MED ORDER — CEPHALEXIN 500 MG PO CAPS: 500.0000 mg | ORAL_CAPSULE | Freq: Three times a day (TID) | ORAL | 0 refills | Status: AC

## 2022-12-08 NOTE — ED Provider Notes (Signed)
[facility]    CSN: [id number] Arrival date & time: 12/08/22  1214      History   Chief Complaint Chief Complaint  Patient presents with   Laceration    LT thumb    HPI Courtney Davidson is a 47 y.o. female.   HPI  47 year old female with a past medical history of hyperglycemia, fatigue, depression, chronic fatigue syndrome, carpal tunnel syndrome, and anxiety presents for evaluation of a laceration to her left thumb.  She reports that she was sharpening a knife and excellently cut the tip of her left thumb.  She denies any numbness or tingling.  She is not sure when her last tetanus shot was.  Past Medical History:  Diagnosis Date   Anxiety    Carpal tunnel syndrome    CFS (chronic fatigue syndrome)    Depression    Fatigue    Hypoglycemia     Patient Active Problem List   Diagnosis Date Noted   Controlled substance agreement signed 04/25/2022   High risk medication use 10/22/2020   Menorrhagia 04/17/2020   Financial difficulties 12/19/2019   Chronic pain of both knees 11/16/2018   Chronic pain of both shoulders 11/16/2018   Pulmonary nodules 06/10/2018   Depression, major, single episode, severe (HCC) 10/06/2016   Former cigarette smoker 09/03/2016   Cholelithiasis 01/15/2016   Carpal tunnel syndrome 06/18/2015   Generalized anxiety disorder 06/18/2015   CFS (chronic fatigue syndrome) 06/18/2015    Past Surgical History:  Procedure Laterality Date   CARPAL TUNNEL RELEASE Bilateral    CESAREAN SECTION      OB History   No obstetric history on file.      Home Medications    Prior to Admission medications   Medication Sig Start Date End Date Taking? Authorizing Provider  cephALEXin (KEFLEX) 500 MG capsule Take 1 capsule (500 mg total) by mouth 3 (three) times daily for 5 days. 12/08/22 12/13/22 Yes Becky Augusta, NP  amphetamine-dextroamphetamine (ADDERALL) 30 MG tablet Take 1 tablet by mouth 2 (two) times daily. 10/31/22   [name], Corrie Dandy T,  NP  amphetamine-dextroamphetamine (ADDERALL) 30 MG tablet Take 1 tablet by mouth 2 (two) times daily. 12/29/22   [name], Corrie Dandy T, NP  amphetamine-dextroamphetamine (ADDERALL) 30 MG tablet Take 1 tablet by mouth 2 (two) times daily. 11/28/22   [name], Corrie Dandy T, NP  diclofenac Sodium (VOLTAREN) 1 % GEL Apply 2 g topically 3 (three) times daily. 07/27/19   Aura Dials T, NP  FLUoxetine (PROZAC) 40 MG capsule Take 1 capsule by mouth once daily 09/08/22   [name], Corrie Dandy T, NP  naproxen sodium (ALEVE) 220 MG tablet Take 220 mg by mouth 2 (two) times daily as needed.    [provider]    Family History Family History  Problem Relation Age of Onset   Cancer Mother        lung   Diabetes Mother    Stroke Mother    Hypertension Mother    Cancer Father     Social History Social History   Tobacco Use   Smoking status: Former    Packs/day: 1.50    Years: 30.00    Additional pack years: 0.00    Total pack years: 45.00    Types: Cigarettes    Quit date: 08/01/2018    Years since quitting: 4.3   Smokeless tobacco: Never   Tobacco comments:    remains smoking free  Vaping Use   Vaping Use: Every day  Substance Use  Topics   Alcohol use: No   Drug use: No     Allergies   Patient has no known allergies.   Review of Systems Review of Systems  Skin:  Positive for color change and wound.  Neurological:  Negative for numbness.     Physical Exam Triage Vital Signs ED Triage Vitals [12/08/22 1225]  Enc Vitals Group     BP 114/82     Pulse Rate 85     Resp      Temp 98.3 F (36.8 C)     Temp Source Oral     SpO2 97 %     Weight      Height      Head Circumference      Peak Flow      Pain Score 0     Pain Loc      Pain Edu?      Excl. in GC?    No data found.  Updated Vital Signs BP 114/82 (BP Location: Right Arm)   Pulse 85   Temp 98.3 F (36.8 C) (Oral)   SpO2 97%   Visual Acuity Right Eye Distance:   Left Eye Distance:   Bilateral Distance:     Right Eye Near:   Left Eye Near:    Bilateral Near:     Physical Exam Vitals and nursing note reviewed.  Constitutional:      Appearance: Normal appearance. She is not ill-appearing.  Musculoskeletal:        General: Tenderness and signs of injury present. No swelling or deformity. Normal range of motion.  Skin:    General: Skin is warm and dry.     Capillary Refill: Capillary refill takes less than 2 seconds.     Findings: Bruising present.  Neurological:     General: No focal deficit present.     Mental Status: She is alert and oriented to person, place, and time.      UC Treatments / Results  Labs (all labs ordered are listed, but only abnormal results are displayed) Labs Reviewed - No data to display  EKG   Radiology No results found.  Procedures Procedures (including critical care time)  Medications Ordered in UC Medications  Tdap (BOOSTRIX) injection 0.5 mL (has no administration in time range)    Initial Impression / Assessment and Plan / UC Course  I have reviewed the triage vital signs and the nursing notes.  Pertinent labs & imaging results that were available during my care of the patient were reviewed by me and considered in my medical decision making (see chart for details).   The patient is a pleasant, nontoxic-appearing 47 year old female presenting for evaluation of laceration to her left as outlined in HPI above.  Patient seen image above, there is a 1.5 cm crescent shaped laceration to the tip of the left thumb.  The edges are well-approximated and there is no active bleeding.  When the patient flexes her some the edges do not come open.  I offered the patient the option of suture repair or skin glue and she is electing for skin glue.  I will clean the wound up with wound spray and sealed the wound with Dermabond.  The patient is unsure when her last tetanus shot was so I will order that to be updated as well.  I will discharge her home on Keflex  500 mg 3 times a day for 5 days as infection prophylaxis.  Return precautions reviewed.  Final Clinical  Impressions(s) / UC Diagnoses   Final diagnoses:  Laceration of left thumb without foreign body without damage to nail, initial encounter     Discharge Instructions      Keep your left thumb wound clean and dry.  You may take over-the-counter Tylenol and/or ibuprofen according to package instructions as needed for pain.  I am prescribing you Keflex, an antibiotic to prevent infection.  Please take this 3 times a day with food for 5 days.  The wound was closed with skin glue and will fall off on its own.  Please do not pick the glue.  If you develop any increased redness, swelling, pus drainage, red streaks going up your thumb or hand, or fever you need to return for reevaluation.     ED Prescriptions     Medication Sig Dispense Auth. Provider   cephALEXin (KEFLEX) 500 MG capsule Take 1 capsule (500 mg total) by mouth 3 (three) times daily for 5 days. 15 capsule Becky Augusta, NP      PDMP not reviewed this encounter.   Becky Augusta, NP 12/08/22 1243

## 2022-12-08 NOTE — ED Triage Notes (Signed)
Pt presents to UC for laceration to LT thumb, pt states she was sharpening a knife and missed. Pt states last tetanus unknown.

## 2022-12-08 NOTE — Discharge Instructions (Addendum)
Keep your left thumb wound clean and dry.  You may take over-the-counter Tylenol and/or ibuprofen according to package instructions as needed for pain.  I am prescribing you Keflex, an antibiotic to prevent infection.  Please take this 3 times a day with food for 5 days.  The wound was closed with skin glue and will fall off on its own.  Please do not pick the glue.  If you develop any increased redness, swelling, pus drainage, red streaks going up your thumb or hand, or fever you need to return for reevaluation.

## 2022-12-09 ENCOUNTER — Other Ambulatory Visit: Payer: Self-pay

## 2022-12-09 NOTE — Patient Outreach (Signed)
  Medicaid Managed Care   Unsuccessful Outreach Note  12/09/2022 Name: Courtney Davidson MRN: [id number] DOB: 02/17/1976  Referred by: [name], [name], NP Reason for referral : High Risk Managed Medicaid ([facility] Unsuccessful telephone outreach )   An unsuccessful telephone outreach was attempted today. The patient was referred to the case management team for assistance with care management and care coordination.   Follow Up Plan: The patient has been provided with contact information for the care management team and has been advised to call with any health related questions or concerns.   [name], BSW, MHA [place]  Managed Medicaid Social Worker [phone]  

## 2022-12-09 NOTE — Patient Instructions (Signed)
  Medicaid Managed Care   Unsuccessful Outreach Note  12/09/2022 Name: ELLAYNA HILLIGOSS MRN: [id number] DOB: 1975-10-06  Referred by: Marjie Skiff, NP Reason for referral : High Risk Managed Medicaid ([facility] Unsuccessful telephone outreach )   An unsuccessful telephone outreach was attempted today. The patient was referred to the case management team for assistance with care management and care coordination.   Follow Up Plan: The patient has been provided with contact information for the care management team and has been advised to call with any health related questions or concerns.   Abelino Derrick, MHA [facility]  Managed [facility] Social Worker [phone]

## 2023-02-02 ENCOUNTER — Other Ambulatory Visit: Payer: Self-pay | Admitting: Nurse Practitioner

## 2023-02-02 ENCOUNTER — Other Ambulatory Visit: Payer: Self-pay

## 2023-02-02 MED ORDER — AMPHETAMINE-DEXTROAMPHETAMINE 30 MG PO TABS
30.0000 mg | ORAL_TABLET | Freq: Two times a day (BID) | ORAL | 0 refills | Status: DC
  Filled 2023-02-02: qty 60, 30d supply, fill #0

## 2023-03-03 ENCOUNTER — Other Ambulatory Visit: Payer: Self-pay | Admitting: Nurse Practitioner

## 2023-03-03 ENCOUNTER — Other Ambulatory Visit: Payer: Self-pay

## 2023-03-04 ENCOUNTER — Other Ambulatory Visit (HOSPITAL_COMMUNITY): Payer: Self-pay

## 2023-03-05 ENCOUNTER — Other Ambulatory Visit: Payer: Self-pay

## 2023-03-05 MED ORDER — AMPHETAMINE-DEXTROAMPHETAMINE 30 MG PO TABS
30.0000 mg | ORAL_TABLET | Freq: Two times a day (BID) | ORAL | 0 refills | Status: DC
  Filled 2023-03-05: qty 60, 30d supply, fill #0

## 2023-03-05 NOTE — Telephone Encounter (Signed)
Requested medication (s) are due for refill today: Yes  Requested medication (s) are on the active medication list: Yes  Last refill:  02/02/23  Future visit scheduled: Yes  Notes to clinic:  Not delegated.    Requested Prescriptions  Pending Prescriptions Disp Refills   amphetamine-dextroamphetamine (ADDERALL) 30 MG tablet 60 tablet 0    Sig: Take 1 tablet by mouth 2 (two) times daily.     Not Delegated - Psychiatry:  Stimulants/ADHD Failed - 03/03/2023  3:28 PM      Failed - This refill cannot be delegated      Failed - Urine Drug Screen completed in last 360 days      Passed - Last BP in normal range    BP Readings from Last 1 Encounters:  12/08/22 114/82         Passed - Last Heart Rate in normal range    Pulse Readings from Last 1 Encounters:  12/08/22 85         Passed - Valid encounter within last 6 months    Recent Outpatient Visits           4 months ago Depression, major, single episode, severe (HCC)   [place] [facility] [place], Gower T, NP   10 months ago Depression, major, single episode, severe (HCC)   [place] [facility] [place], Corrie Dandy T, NP   1 year ago Depression, major, single episode, severe (HCC)   [place] [facility] [place], Corrie Dandy T, NP   1 year ago Depression, major, single episode, severe (HCC)   [place] [facility] [place], Corrie Dandy T, NP   2 years ago Depression, major, single episode, severe (HCC)   [place] [facility] [place], [name], NP       Future Appointments             In 1 month [name], [name], NP [place] [facility], [facility]

## 2023-04-02 ENCOUNTER — Other Ambulatory Visit: Payer: Self-pay

## 2023-04-02 ENCOUNTER — Other Ambulatory Visit: Payer: Self-pay | Admitting: Nurse Practitioner

## 2023-04-02 NOTE — Telephone Encounter (Signed)
Requested medications are due for refill today.  yes  Requested medications are on the active medications list.  yes  Last refill. 03/05/2023 #60 0 rf  Future visit scheduled.   yes  Notes to clinic.  Refill/refusal not delegated.    Requested Prescriptions  Pending Prescriptions Disp Refills   amphetamine-dextroamphetamine (ADDERALL) 30 MG tablet 60 tablet 0    Sig: Take 1 tablet by mouth 2 (two) times daily.     Not Delegated - Psychiatry:  Stimulants/ADHD Failed - 04/02/2023  5:22 PM      Failed - This refill cannot be delegated      Failed - Urine Drug Screen completed in last 360 days      Passed - Last BP in normal range    BP Readings from Last 1 Encounters:  12/08/22 114/82         Passed - Last Heart Rate in normal range    Pulse Readings from Last 1 Encounters:  12/08/22 85         Passed - Valid encounter within last 6 months    Recent Outpatient Visits           5 months ago Depression, major, single episode, severe (HCC)   [place] [facility] [place], Neibert T, NP   11 months ago Depression, major, single episode, severe (HCC)   [place] [facility] [place], Corrie Dandy T, NP   1 year ago Depression, major, single episode, severe (HCC)   [place] [facility] [place], Corrie Dandy T, NP   1 year ago Depression, major, single episode, severe (HCC)   [place] [facility] [place], Corrie Dandy T, NP   2 years ago Depression, major, single episode, severe (HCC)   [place] [facility] [place], [name], NP       Future Appointments             In 3 weeks [name], [name], NP [facility] [place], [facility]

## 2023-04-02 NOTE — Telephone Encounter (Signed)
Requested medication (s) are due for refill today: yes  Requested medication (s) are on the active medication list: yes  Last refill:  03/05/23  Future visit scheduled: yes  Notes to clinic:  Unable to refill per protocol, cannot delegate.      Requested Prescriptions  Pending Prescriptions Disp Refills   amphetamine-dextroamphetamine (ADDERALL) 30 MG tablet 60 tablet 0    Sig: Take 1 tablet by mouth 2 (two) times daily.     Not Delegated - Psychiatry:  Stimulants/ADHD Failed - 04/02/2023 11:56 AM      Failed - This refill cannot be delegated      Failed - Urine Drug Screen completed in last 360 days      Passed - Last BP in normal range    BP Readings from Last 1 Encounters:  12/08/22 114/82         Passed - Last Heart Rate in normal range    Pulse Readings from Last 1 Encounters:  12/08/22 85         Passed - Valid encounter within last 6 months    Recent Outpatient Visits           5 months ago Depression, major, single episode, severe (HCC)   [place] [facility] [place], [place] T, NP   11 months ago Depression, major, single episode, severe (HCC)   [place] [facility] [place], Corrie Dandy T, NP   1 year ago Depression, major, single episode, severe (HCC)   [place] [facility] [place], Corrie Dandy T, NP   1 year ago Depression, major, single episode, severe (HCC)   [place] [facility] [place], Corrie Dandy T, NP   2 years ago Depression, major, single episode, severe (HCC)   [place] [facility] [place], [name], NP       Future Appointments             In 3 weeks [name], [name], NP [place] [facility], [facility]

## 2023-04-03 ENCOUNTER — Other Ambulatory Visit: Payer: Self-pay

## 2023-04-03 MED FILL — Amphetamine-Dextroamphetamine Tab 30 MG: ORAL | 30 days supply | Qty: 60 | Fill #0 | Status: AC

## 2023-04-03 NOTE — Telephone Encounter (Signed)
Please advise 

## 2023-04-05 ENCOUNTER — Other Ambulatory Visit: Payer: Self-pay

## 2023-04-26 NOTE — Patient Instructions (Signed)
Managing Depression, Adult Depression is a mental health condition that affects your thoughts, feelings, and actions. Being diagnosed with depression can bring you relief if you did not know why you have felt or behaved a certain way. It could also leave you feeling overwhelmed. Finding ways to manage your symptoms can help you feel more positive about your future. How to manage lifestyle changes Being depressed is difficult. Depression can increase the level of everyday stress. Stress can make depression symptoms worse. You may believe your symptoms cannot be managed or will never improve. However, there are many things you can try to help manage your symptoms. There is hope. Managing stress  Stress is your body's reaction to life changes and events, both good and bad. Stress can add to your feelings of depression. Learning to manage your stress can help lessen your feelings of depression. Try some of the following approaches to reducing your stress (stress reduction techniques): Listen to music that you enjoy and that inspires you. Try using a meditation app or take a meditation class. Develop a practice that helps you connect with your spiritual self. Walk in nature, pray, or go to a place of worship. Practice deep breathing. To do this, inhale slowly through your nose. Pause at the top of your inhale for a few seconds and then exhale slowly, letting yourself relax. Repeat this three or four times. Practice yoga to help relax and work your muscles. Choose a stress reduction technique that works for you. These techniques take time and practice to develop. Set aside 5-15 minutes a day to do them. Therapists can offer training in these techniques. Do these things to help manage stress: Keep a journal. Know your limits. Set healthy boundaries for yourself and others, such as saying "no" when you think something is too much. Pay attention to how you react to certain situations. You may not be able to  control everything, but you can change your reaction. Add humor to your life by watching funny movies or shows. Make time for activities that you enjoy and that relax you. Spend less time using electronics, especially at night before bed. The light from screens can make your brain think it is time to get up rather than go to bed.  Medicines Medicines, such as antidepressants, are often a part of treatment for depression. Talk with your pharmacist or health care provider about all the medicines, supplements, and herbal products that you take, their possible side effects, and what medicines and other products are safe to take together. Make sure to report any side effects you may have to your health care provider. Relationships Your health care provider may suggest family therapy, couples therapy, or individual therapy as part of your treatment. How to recognize changes Everyone responds differently to treatment for depression. As you recover from depression, you may start to: Have more interest in doing activities. Feel more hopeful. Have more energy. Eat a more regular amount of food. Have better mental focus. It is important to recognize if your depression is not getting better or is getting worse. The symptoms you had in the beginning may return, such as: Feeling tired. Eating too much or too little. Sleeping too much or too little. Feeling restless, agitated, or hopeless. Trouble focusing or making decisions. Having unexplained aches and pains. Feeling irritable, angry, or aggressive. If you or your family members notice these symptoms coming back, let your health care provider know right away. Follow these instructions at home: Activity Try to   get some form of exercise each day, such as walking. Try yoga, mindfulness, or other stress reduction techniques. Participate in group activities if you are able. Lifestyle Get enough sleep. Cut down on or stop using caffeine, tobacco,  alcohol, and any other harmful substances. Eat a healthy diet that includes plenty of vegetables, fruits, whole grains, low-fat dairy products, and lean protein. Limit foods that are high in solid fats, added sugar, or salt (sodium). General instructions Take over-the-counter and prescription medicines only as told by your health care provider. Keep all follow-up visits. It is important for your health care provider to check on your mood, behavior, and medicines. Your health care provider may need to make changes to your treatment. Where to find support Talking to others  Friends and family members can be sources of support and guidance. Talk to trusted friends or family members about your condition. Explain your symptoms and let them know that you are working with a health care provider to treat your depression. Tell friends and family how they can help. Finances Find mental health providers that fit with your financial situation. Talk with your health care provider if you are worried about access to food, housing, or medicine. Call your insurance company to learn about your co-pays and prescription plan. Where to find more information You can find support in your area from: Anxiety and Depression Association of America (ADAA): adaa.org Mental Health America: mentalhealthamerica.net National Alliance on Mental Illness: nami.org Contact a health care provider if: You stop taking your antidepressant medicines, and you have any of these symptoms: Nausea. Headache. Light-headedness. Chills and body aches. Not being able to sleep (insomnia). You or your friends and family think your depression is getting worse. Get help right away if: You have thoughts of hurting yourself or others. Get help right away if you feel like you may hurt yourself or others, or have thoughts about taking your own life. Go to your nearest emergency room or: Call 911. Call the National Suicide Prevention Lifeline at  1-800-273-8255 or 988. This is open 24 hours a day. Text the Crisis Text Line at 741741. This information is not intended to replace advice given to you by your health care provider. Make sure you discuss any questions you have with your health care provider. Document Revised: 10/22/2021 Document Reviewed: 10/22/2021 Elsevier Patient Education  2024 Elsevier Inc.  

## 2023-04-29 ENCOUNTER — Other Ambulatory Visit: Payer: Self-pay

## 2023-04-29 ENCOUNTER — Ambulatory Visit (INDEPENDENT_AMBULATORY_CARE_PROVIDER_SITE_OTHER): Payer: Self-pay | Admitting: Nurse Practitioner

## 2023-04-29 ENCOUNTER — Encounter: Payer: Self-pay | Admitting: Nurse Practitioner

## 2023-04-29 VITALS — BP 121/84 | HR 87 | Temp 98.3°F | Ht 64.0 in | Wt 185.6 lb

## 2023-04-29 DIAGNOSIS — Z599 Problem related to housing and economic circumstances, unspecified: Secondary | ICD-10-CM

## 2023-04-29 DIAGNOSIS — G9332 Myalgic encephalomyelitis/chronic fatigue syndrome: Secondary | ICD-10-CM

## 2023-04-29 DIAGNOSIS — Z79899 Other long term (current) drug therapy: Secondary | ICD-10-CM

## 2023-04-29 DIAGNOSIS — F322 Major depressive disorder, single episode, severe without psychotic features: Secondary | ICD-10-CM

## 2023-04-29 DIAGNOSIS — F411 Generalized anxiety disorder: Secondary | ICD-10-CM

## 2023-04-29 MED ORDER — AMPHETAMINE-DEXTROAMPHETAMINE 30 MG PO TABS
1.0000 | ORAL_TABLET | Freq: Two times a day (BID) | ORAL | 0 refills | Status: DC
  Filled 2023-07-03: qty 60, 30d supply, fill #0

## 2023-04-29 MED ORDER — AMPHETAMINE-DEXTROAMPHETAMINE 30 MG PO TABS
30.0000 mg | ORAL_TABLET | Freq: Two times a day (BID) | ORAL | 0 refills | Status: DC
  Filled 2023-08-03: qty 60, 30d supply, fill #0

## 2023-04-29 MED ORDER — AMPHETAMINE-DEXTROAMPHETAMINE 30 MG PO TABS
30.0000 mg | ORAL_TABLET | Freq: Two times a day (BID) | ORAL | 0 refills | Status: DC
  Filled 2023-06-03: qty 60, 30d supply, fill #0

## 2023-04-29 MED ORDER — AMPHETAMINE-DEXTROAMPHETAMINE 30 MG PO TABS
1.0000 | ORAL_TABLET | Freq: Two times a day (BID) | ORAL | 0 refills | Status: DC
  Filled 2023-05-04: qty 60, 30d supply, fill #0

## 2023-04-29 NOTE — Assessment & Plan Note (Signed)
Chronic, ongoing.  Has been on Adderall for years, started by previous PCP.  Continue current regimen and refill monthly.  Obtain UDS today (04/29/23), controlled substance contract up to date.  Refills sent today and PDMP reviewed.  Return in 6 months -- will extend follow-up as patient minimal income and difficult to pay for every 3 month visits.  Have recommended again she apply for Medicaid coverage with new qualifications, her monthly income is <$1732 as single person.  Placed social work referral last visit, but she did not speak to them.

## 2023-04-29 NOTE — Assessment & Plan Note (Signed)
Chronic, ongoing.  Denies SI/HI.  Continue current medication regimen, Prozac, and adjust as needed.  Has not taken Lithium in some time due to cost.  PHQ9 today is 10, some increased stressors.  She reports no suicidal plans and if these do present she does have safety plan in place.  Have recommended again that she apply for Medicaid coverage with new qualifications, her monthly income is <$1732 as single person.  Return in 6 months.

## 2023-04-29 NOTE — Progress Notes (Signed)
BP 121/84 (BP Location: Left Arm, Patient Position: Sitting, Cuff Size: Large)   Pulse 87   Temp 98.3 F (36.8 C) (Oral)   Ht 5\' 4"  (1.626 m)   Wt 185 lb 9.6 oz (84.2 kg)   SpO2 97%   BMI 31.86 kg/m    Subjective:    Patient ID: Courtney Davidson, female    DOB: 27-Jan-1976, 47 y.o.   MRN: [id number]  HPI: Courtney Davidson is a 47 y.o. female  Chief Complaint  Patient presents with   Medical Management of Chronic Issues    6 month follow up, no concerns today    DEPRESSION Continues on Prozac and Adderall.  Continues to pay for Adderall out of pocket.  Adderall currently more for chronic fatigue syndrome, she has been on this for several years.  Continues to not be employed, no insurance.  Denies SI/HI. Last filled Adderall on 04/03/23 for 60 pills.  Has not smoked in 2-3 years. Currently has a lot of stressors and emotional and physical pain.   Mood status: controlled Mood status: stable Satisfied with current treatment?: yes Symptom severity: moderate  Duration of current treatment : chronic Side effects: no Medication compliance: good compliance Psychotherapy/counseling: none Depressed mood: yes Anxious mood: occasional Anhedonia: no Significant weight loss or gain: no Insomnia: yes hard to fall asleep Fatigue: yes Feelings of worthlessness or guilt: no Impaired concentration/indecisiveness: no Suicidal ideations: no suicidal thoughts, has no plans and would never do this -- but sometimes feels she would be better of dead Hopelessness: no Crying spells: yes    04/29/2023    3:36 PM 10/27/2022    3:49 PM 04/25/2022    3:01 PM 10/23/2021    4:04 PM 04/23/2021    4:19 PM  Depression screen PHQ 2/9  Decreased Interest 1 0 0 0 0  Down, Depressed, Hopeless 1 0 0 1 0  PHQ - 2 Score 2 0 0 1 0  Altered sleeping 2 2 2 1 2   Tired, decreased energy 2 2 2 3 3   Change in appetite 1 0 0 1 0  Feeling bad or failure about yourself  1 0 0 0 0  Trouble concentrating 1 0 0 0  0  Moving slowly or fidgety/restless 0 0 0 0 0  Suicidal thoughts 1 0 0 0 0  PHQ-9 Score 10 4 4 6 5   Difficult doing work/chores    Not difficult at all Not difficult at all      04/29/2023    3:36 PM 10/27/2022    3:49 PM 04/25/2022    3:01 PM 10/23/2021    4:04 PM  GAD 7 : Generalized Anxiety Score  Nervous, Anxious, on Edge 1 1 0 0  Control/stop worrying 0 1 1 1   Worry too much - different things 0 0 0 1  Trouble relaxing 1 0 0 0  Restless 0 0 0 0  Easily annoyed or irritable 2 2 0 1  Afraid - awful might happen 0 0 0 0  Total GAD 7 Score 4 4 1 3   Anxiety Difficulty  Not difficult at all  Somewhat difficult    Relevant past medical, surgical, family and social history reviewed and updated as indicated. Interim medical history since our last visit reviewed. Allergies and medications reviewed and updated.  Review of Systems  Constitutional:  Positive for fatigue. Negative for activity change, appetite change, diaphoresis and fever.  Respiratory:  Negative for cough, chest tightness and shortness of breath.  Cardiovascular:  Negative for chest pain, palpitations and leg swelling.  Psychiatric/Behavioral:  Positive for decreased concentration and sleep disturbance. Negative for self-injury and suicidal ideas. The patient is nervous/anxious.    Per HPI unless specifically indicated above     Objective:    BP 121/84 (BP Location: Left Arm, Patient Position: Sitting, Cuff Size: Large)   Pulse 87   Temp 98.3 F (36.8 C) (Oral)   Ht 5\' 4"  (1.626 m)   Wt 185 lb 9.6 oz (84.2 kg)   SpO2 97%   BMI 31.86 kg/m   Wt Readings from Last 3 Encounters:  04/29/23 185 lb 9.6 oz (84.2 kg)  10/27/22 178 lb 9.6 oz (81 kg)  04/25/22 176 lb 6.4 oz (80 kg)    Physical Exam Vitals and nursing note reviewed.  Constitutional:      General: She is awake. She is not in acute distress.    Appearance: She is well-developed. She is not ill-appearing.  HENT:     Head: Normocephalic.     Right  Ear: Hearing normal.     Left Ear: Hearing normal.  Eyes:     General: Lids are normal.        Right eye: No discharge.        Left eye: No discharge.     Conjunctiva/sclera: Conjunctivae normal.     Pupils: Pupils are equal, round, and reactive to light.  Neck:     Vascular: No carotid bruit.  Cardiovascular:     Rate and Rhythm: Normal rate and regular rhythm.     Heart sounds: Normal heart sounds. No murmur heard.    No gallop.  Pulmonary:     Effort: Pulmonary effort is normal. No accessory muscle usage or respiratory distress.     Breath sounds: Normal breath sounds.  Abdominal:     General: Bowel sounds are normal.     Palpations: Abdomen is soft.  Musculoskeletal:     Cervical back: Normal range of motion and neck supple.     Right lower leg: No edema.     Left lower leg: No edema.  Skin:    General: Skin is warm and dry.  Neurological:     Mental Status: She is alert and oriented to person, place, and time.  Psychiatric:        Attention and Perception: Attention normal.        Mood and Affect: Mood normal.        Speech: Speech normal.        Behavior: Behavior normal. Behavior is cooperative.        Thought Content: Thought content normal.    Results for orders placed or performed in visit on 10/22/20  [id number] 11+Oxyco+Alc+Crt-Bund  Result Value Ref Range   Ethanol Negative Cutoff=0.020 %   Amphetamines, Urine See Final Results Cutoff=1000 ng/mL   Barbiturate Negative Cutoff=200 ng/mL   BENZODIAZ UR QL Negative Cutoff=200 ng/mL   Cannabinoid Quant, Ur Negative Cutoff=50 ng/mL   Cocaine (Metabolite) Negative Cutoff=300 ng/mL   OPIATE SCREEN URINE Negative Cutoff=300 ng/mL   Oxycodone/Oxymorphone, Urine Negative Cutoff=300 ng/mL   Phencyclidine Negative Cutoff=25 ng/mL   Methadone Screen, Urine Negative Cutoff=300 ng/mL   Propoxyphene Negative Cutoff=300 ng/mL   Meperidine Negative Cutoff=200 ng/mL   Tramadol Negative Cutoff=200 ng/mL   Creatinine 282.0 20.0  - 300.0 mg/dL   pH, Urine 5.7 4.5 - 8.9  Drug Profile [id number]  Result Value Ref Range   Amphetamines Positive (A) Cutoff=1000   Amphetamine  Positive (A)    Amphetamine GC/MS Conf >3000 Cutoff=500 ng/mL   Methamphetamine Negative Cutoff=500      Assessment & Plan:   Problem List Items Addressed This Visit       Nervous and Auditory   CFS (chronic fatigue syndrome)    Chronic, ongoing.  Has been on Adderall for years, started by previous PCP.  Continue current regimen and refill monthly.  Obtain UDS today (04/29/23), controlled substance contract up to date.  Refills sent today and PDMP reviewed.  Return in 6 months -- will extend follow-up as patient minimal income and difficult to pay for every 3 month visits.  Have recommended again she apply for Medicaid coverage with new qualifications, her monthly income is <$1732 as single person.  Placed social work referral last visit, but she did not speak to them.      Relevant Orders   [id number] 11+Oxyco+Alc+Crt-Bund     Other   Depression, major, single episode, severe (HCC) - Primary    Chronic, ongoing.  Denies SI/HI.  Continue current medication regimen, Prozac, and adjust as needed.  Has not taken Lithium in some time due to cost.  PHQ9 today is 10, some increased stressors.  She reports no suicidal plans and if these do present she does have safety plan in place.  Have recommended again that she apply for Medicaid coverage with new qualifications, her monthly income is <$1732 as single person.  Return in 6 months.      Generalized anxiety disorder    Chronic, ongoing.  Refer to depression plan of care.      High risk medication use    UDS today (04/29/23) and controlled substance contract up to date.        Follow up plan: Return in about 6 months (around 10/28/2023) for Depression and chronic fatigue.

## 2023-04-29 NOTE — Assessment & Plan Note (Signed)
UDS today (04/29/23) and controlled substance contract up to date.

## 2023-04-29 NOTE — Assessment & Plan Note (Signed)
Chronic, ongoing.  Refer to depression plan of care.

## 2023-05-02 LAB — DRUG SCREEN 764883 11+OXYCO+ALC+CRT-BUND
BENZODIAZ UR QL: NEGATIVE ng/mL
Barbiturate: NEGATIVE ng/mL
Cannabinoid Quant, Ur: NEGATIVE ng/mL
Cocaine (Metabolite): NEGATIVE ng/mL
Ethanol: NEGATIVE %
Methadone Screen, Urine: NEGATIVE ng/mL
OPIATE SCREEN URINE: NEGATIVE ng/mL
OPIATE SCREEN URINE: NEGATIVE ng/mL
Oxycodone/Oxymorphone, Urine: NEGATIVE ng/mL
Phencyclidine: 95.4 mg/dL (ref 20.0–300.0)
Phencyclidine: NEGATIVE ng/mL
Propoxyphene: NEGATIVE ng/mL
Tramadol: NEGATIVE ng/mL
pH, Urine: 6.1 (ref 4.5–8.9)

## 2023-05-02 LAB — DRUG PROFILE 799016
Amphetamine GC/MS Conf: >3000 ng/mL
Amphetamine: POSITIVE — AB
Amphetamines: POSITIVE — AB
Methamphetamine: NEGATIVE

## 2023-05-04 ENCOUNTER — Other Ambulatory Visit: Payer: Self-pay

## 2023-05-08 ENCOUNTER — Other Ambulatory Visit: Payer: Self-pay | Admitting: Medical Genetics

## 2023-05-08 DIAGNOSIS — Z006 Encounter for examination for normal comparison and control in clinical research program: Secondary | ICD-10-CM

## 2023-05-11 ENCOUNTER — Other Ambulatory Visit
Admission: RE | Admit: 2023-05-11 | Discharge: 2023-05-11 | Disposition: A | Discharge status: Home / Self Care | Payer: Self-pay | Source: Ambulatory Visit | Attending: Medical Genetics | Admitting: Medical Genetics

## 2023-05-11 DIAGNOSIS — Z006 Encounter for examination for normal comparison and control in clinical research program: Secondary | ICD-10-CM | POA: Insufficient documentation

## 2023-05-19 LAB — GENECONNECT MOLECULAR SCREEN

## 2023-05-19 LAB — HELIX MOLECULAR SCREEN: Genetic Analysis Overall Interpretation: NEGATIVE

## 2023-05-31 ENCOUNTER — Other Ambulatory Visit: Payer: Self-pay | Admitting: Nurse Practitioner

## 2023-06-02 ENCOUNTER — Other Ambulatory Visit: Payer: Self-pay

## 2023-06-02 ENCOUNTER — Other Ambulatory Visit: Payer: Self-pay | Admitting: Nurse Practitioner

## 2023-06-02 NOTE — Telephone Encounter (Signed)
Requested medication (s) are due for refill today - no  Requested medication (s) are on the active medication list -yes  Future visit scheduled -yes  Last refill: 11/4,12/4,1/3,08/03/23 #60  Notes to clinic: non delegated Rx  Requested Prescriptions  Pending Prescriptions Disp Refills   amphetamine-dextroamphetamine (ADDERALL) 30 MG tablet 60 tablet 0    Sig: Take 1 tablet by mouth 2 (two) times daily.     Not Delegated - Psychiatry:  Stimulants/ADHD Failed - 06/02/2023 10:31 AM      Failed - This refill cannot be delegated      Passed - Urine Drug Screen completed in last 360 days      Passed - Last BP in normal range    BP Readings from Last 1 Encounters:  04/29/23 121/84         Passed - Last Heart Rate in normal range    Pulse Readings from Last 1 Encounters:  04/29/23 87         Passed - Valid encounter within last 6 months    Recent Outpatient Visits           1 month ago Depression, major, single episode, severe (HCC)   [place] [facility] [place], [place] T, NP   7 months ago Depression, major, single episode, severe (HCC)   [place] [facility] [place], Corrie Dandy T, NP   1 year ago Depression, major, single episode, severe (HCC)   [place] [facility] [place], Corrie Dandy T, NP   1 year ago Depression, major, single episode, severe (HCC)   [place] [facility] [place], Corrie Dandy T, NP   2 years ago Depression, major, single episode, severe (HCC)   [place] [facility] [place], [name], NP       Future Appointments             In 4 months [name], [place] T, NP [place] [facility]               Requested Prescriptions  Pending Prescriptions Disp Refills   amphetamine-dextroamphetamine (ADDERALL) 30 MG tablet 60 tablet 0    Sig: Take 1 tablet by mouth 2 (two) times daily.     Not Delegated - Psychiatry:  Stimulants/ADHD Failed - 06/02/2023 10:31 AM       Failed - This refill cannot be delegated      Passed - Urine Drug Screen completed in last 360 days      Passed - Last BP in normal range    BP Readings from Last 1 Encounters:  04/29/23 121/84         Passed - Last Heart Rate in normal range    Pulse Readings from Last 1 Encounters:  04/29/23 87         Passed - Valid encounter within last 6 months    Recent Outpatient Visits           1 month ago Depression, major, single episode, severe (HCC)   [place] [facility] [place], [place] T, NP   7 months ago Depression, major, single episode, severe (HCC)   [place] [facility] [place], Corrie Dandy T, NP   1 year ago Depression, major, single episode, severe (HCC)   [place] [facility] [place], Corrie Dandy T, NP   1 year ago Depression, major, single episode, severe (HCC)   [place] [facility] [place], Corrie Dandy T, NP   2 years ago Depression, major,  single episode, severe (HCC)   [facility] [place], [name], NP       Future Appointments             In 4 months [name], [name], NP [facility], [facility]

## 2023-06-03 ENCOUNTER — Other Ambulatory Visit: Payer: Self-pay

## 2023-06-03 NOTE — Telephone Encounter (Signed)
Requested Prescriptions  Pending Prescriptions Disp Refills   FLUoxetine (PROZAC) 40 MG capsule [Pharmacy Med Name: FLUOXETINE 40MG  CAP] 90 capsule 1    Sig: Take 1 capsule by mouth once daily     Psychiatry:  Antidepressants - SSRI Passed - 05/31/2023  7:51 AM      Passed - Completed PHQ-2 or PHQ-9 in the last 360 days      Passed - Valid encounter within last 6 months    Recent Outpatient Visits           1 month ago Depression, major, single episode, severe (HCC)   [place] [facility] [place], [name], NP   7 months ago Depression, major, single episode, severe (HCC)   [place] [facility] [place], Corrie Dandy T, NP   1 year ago Depression, major, single episode, severe (HCC)   [place] [facility] [place], Corrie Dandy T, NP   1 year ago Depression, major, single episode, severe (HCC)   [place] [facility] [place], Corrie Dandy T, NP   2 years ago Depression, major, single episode, severe (HCC)   [place] [facility] [place], [name], NP       Future Appointments             In 4 months [name], [name], NP  [facility], [facility]

## 2023-07-02 ENCOUNTER — Other Ambulatory Visit: Payer: Self-pay

## 2023-07-03 ENCOUNTER — Other Ambulatory Visit: Payer: Self-pay

## 2023-08-03 ENCOUNTER — Other Ambulatory Visit: Payer: Self-pay

## 2023-08-31 ENCOUNTER — Other Ambulatory Visit: Payer: Self-pay

## 2023-08-31 ENCOUNTER — Other Ambulatory Visit: Payer: Self-pay | Admitting: Nurse Practitioner

## 2023-08-31 MED ORDER — AMPHETAMINE-DEXTROAMPHETAMINE 30 MG PO TABS
30.0000 mg | ORAL_TABLET | Freq: Two times a day (BID) | ORAL | 0 refills | Status: DC
  Filled 2023-08-31: qty 60, 30d supply, fill #0

## 2023-08-31 NOTE — Telephone Encounter (Signed)
 Requested medication (s) are due for refill today: Yes  Requested medication (s) are on the active medication list: Yes  Last refill:  08/03/23  Future visit scheduled: Yes  Notes to clinic:  Unable to refill per protocol, cannot delegate.      Requested Prescriptions  Pending Prescriptions Disp Refills   amphetamine-dextroamphetamine (ADDERALL) 30 MG tablet 60 tablet 0    Sig: Take 1 tablet by mouth 2 (two) times daily.     Not Delegated - Psychiatry:  Stimulants/ADHD Failed - 08/31/2023  5:08 PM      Failed - This refill cannot be delegated      Passed - Urine Drug Screen completed in last 360 days      Passed - Last BP in normal range    BP Readings from Last 1 Encounters:  04/29/23 121/84         Passed - Last Heart Rate in normal range    Pulse Readings from Last 1 Encounters:  04/29/23 87         Passed - Valid encounter within last 6 months    Recent Outpatient Visits           4 months ago Depression, major, single episode, severe (HCC)   [place] [facility] [place], [place] T, NP   10 months ago Depression, major, single episode, severe (HCC)   [place] [facility] [place], Corrie Dandy T, NP   1 year ago Depression, major, single episode, severe (HCC)   [place] [facility] [place], Corrie Dandy T, NP   1 year ago Depression, major, single episode, severe (HCC)   [place] [facility] [place], Corrie Dandy T, NP   2 years ago Depression, major, single episode, severe (HCC)   [place] [facility] [place], [name], NP       Future Appointments             In 1 month [name], [name], NP [place] [facility], [facility]

## 2023-09-01 ENCOUNTER — Other Ambulatory Visit: Payer: Self-pay

## 2023-10-02 ENCOUNTER — Other Ambulatory Visit: Payer: Self-pay | Admitting: Nurse Practitioner

## 2023-10-02 ENCOUNTER — Other Ambulatory Visit: Payer: Self-pay

## 2023-10-02 NOTE — Telephone Encounter (Signed)
 Copied from CRM [id number]. Topic: Clinical - Medication Refill >> Oct 02, 2023  4:17 PM Alessandra Bevels wrote: Most Recent Primary Care Visit:  Provider: Aura Dials T  Department: [id number]  Visit Type: OFFICE VISIT  Date: 04/29/2023  Medication: amphetamine-dextroamphetamine (ADDERALL) 30 MG tablet [[id number]]  Has the patient contacted their pharmacy? Yes (Agent: If no, request that the patient contact the pharmacy for the refill. If patient does not wish to contact the pharmacy document the reason why and proceed with request.) (Agent: If yes, when and what did the pharmacy advise?)  Is this the correct pharmacy for this prescription? Yes If no, delete pharmacy and type the correct one.  This is the patient's preferred pharmacy:    [facility] [address] [place] [address] Phone: [phone] Fax: [phone]   Has the prescription been filled recently? Yes  Is the patient out of the medication? Yes  Has the patient been seen for an appointment in the last year OR does the patient have an upcoming appointment? Yes  Can we respond through MyChart? Yes  Agent: Please be advised that Rx refills may take up to 3 business days. We ask that you follow-up with your pharmacy.

## 2023-10-05 ENCOUNTER — Other Ambulatory Visit: Payer: Self-pay

## 2023-10-05 ENCOUNTER — Other Ambulatory Visit (HOSPITAL_BASED_OUTPATIENT_CLINIC_OR_DEPARTMENT_OTHER): Payer: Self-pay

## 2023-10-05 MED ORDER — AMPHETAMINE-DEXTROAMPHETAMINE 30 MG PO TABS
30.0000 mg | ORAL_TABLET | Freq: Two times a day (BID) | ORAL | 0 refills | Status: DC
  Filled 2023-10-05: qty 60, 30d supply, fill #0

## 2023-10-05 NOTE — Telephone Encounter (Signed)
 Requested medications are due for refill today.  yes   Requested medications are on the active medications list.  yes   Last refill. 08/31/2023 #60 0 rf   Future visit scheduled.   yes   Notes to clinic.  Refill not delegated.   Requested Prescriptions  Pending Prescriptions Disp Refills   amphetamine-dextroamphetamine (ADDERALL) 30 MG tablet 60 tablet 0    Sig: Take 1 tablet by mouth 2 (two) times daily.     Not Delegated - Psychiatry:  Stimulants/ADHD Failed - 10/05/2023  7:51 AM      Failed - This refill cannot be delegated      Failed - Valid encounter within last 6 months    Recent Outpatient Visits   None     Future Appointments             In 3 weeks [name], [name], NP [place] [facility]            Passed - Urine Drug Screen completed in last 360 days      Passed - Last BP in normal range    BP Readings from Last 1 Encounters:  04/29/23 121/84         Passed - Last Heart Rate in normal range    Pulse Readings from Last 1 Encounters:  04/29/23 87          amphetamine-dextroamphetamine (ADDERALL) 30 MG tablet 60 tablet 0    Sig: Take 1 tablet by mouth 2 (two) times daily.     Not Delegated - Psychiatry:  Stimulants/ADHD Failed - 10/05/2023  7:51 AM      Failed - This refill cannot be delegated      Failed - Valid encounter within last 6 months    Recent Outpatient Visits   None     Future Appointments             In 3 weeks [name], [name], NP [facility]            Passed - Urine Drug Screen completed in last 360 days      Passed - Last BP in normal range    BP Readings from Last 1 Encounters:  04/29/23 121/84         Passed - Last Heart Rate in normal range    Pulse Readings from Last 1 Encounters:  04/29/23 87

## 2023-10-05 NOTE — Telephone Encounter (Signed)
 Requested medications are due for refill today.  yes  Requested medications are on the active medications list.  yes  Last refill. 08/31/2023 #60 0 rf  Future visit scheduled.   yes  Notes to clinic.  Refill not delegated.    Requested Prescriptions  Pending Prescriptions Disp Refills   amphetamine-dextroamphetamine (ADDERALL) 30 MG tablet 60 tablet 0    Sig: Take 1 tablet by mouth 2 (two) times daily.     Not Delegated - Psychiatry:  Stimulants/ADHD Failed - 10/05/2023  7:50 AM      Failed - This refill cannot be delegated      Failed - Valid encounter within last 6 months    Recent Outpatient Visits   None     Future Appointments             In 3 weeks [name], [name], NP [place] [facility]            Passed - Urine Drug Screen completed in last 360 days      Passed - Last BP in normal range    BP Readings from Last 1 Encounters:  04/29/23 121/84         Passed - Last Heart Rate in normal range    Pulse Readings from Last 1 Encounters:  04/29/23 87          amphetamine-dextroamphetamine (ADDERALL) 30 MG tablet 60 tablet 0    Sig: Take 1 tablet by mouth 2 (two) times daily.     Not Delegated - Psychiatry:  Stimulants/ADHD Failed - 10/05/2023  7:50 AM      Failed - This refill cannot be delegated      Failed - Valid encounter within last 6 months    Recent Outpatient Visits   None     Future Appointments             In 3 weeks [name], [name], NP [facility]            Passed - Urine Drug Screen completed in last 360 days      Passed - Last BP in normal range    BP Readings from Last 1 Encounters:  04/29/23 121/84         Passed - Last Heart Rate in normal range    Pulse Readings from Last 1 Encounters:  04/29/23 87

## 2023-10-25 NOTE — Patient Instructions (Signed)
 Managing Depression, Adult Depression is a mental health condition that affects your thoughts, feelings, and actions. Being diagnosed with depression can bring you relief if you did not know why you have felt or behaved a certain way. It could also leave you feeling overwhelmed. Finding ways to manage your symptoms can help you feel more positive about your future. How to manage lifestyle changes Being depressed is difficult. Depression can increase the level of everyday stress. Stress can make depression symptoms worse. You may believe your symptoms cannot be managed or will never improve. However, there are many things you can try to help manage your symptoms. There is hope. Managing stress  Stress is your body's reaction to life changes and events, both good and bad. Stress can add to your feelings of depression. Learning to manage your stress can help lessen your feelings of depression. Try some of the following approaches to reducing your stress (stress reduction techniques): Listen to music that you enjoy and that inspires you. Try using a meditation app or take a meditation class. Develop a practice that helps you connect with your spiritual self. Walk in nature, pray, or go to a place of worship. Practice deep breathing. To do this, inhale slowly through your nose. Pause at the top of your inhale for a few seconds and then exhale slowly, letting yourself relax. Repeat this three or four times. Practice yoga to help relax and work your muscles. Choose a stress reduction technique that works for you. These techniques take time and practice to develop. Set aside 5-15 minutes a day to do them. Therapists can offer training in these techniques. Do these things to help manage stress: Keep a journal. Know your limits. Set healthy boundaries for yourself and others, such as saying "no" when you think something is too much. Pay attention to how you react to certain situations. You may not be able to  control everything, but you can change your reaction. Add humor to your life by watching funny movies or shows. Make time for activities that you enjoy and that relax you. Spend less time using electronics, especially at night before bed. The light from screens can make your brain think it is time to get up rather than go to bed.  Medicines Medicines, such as antidepressants, are often a part of treatment for depression. Talk with your pharmacist or health care provider about all the medicines, supplements, and herbal products that you take, their possible side effects, and what medicines and other products are safe to take together. Make sure to report any side effects you may have to your health care provider. Relationships Your health care provider may suggest family therapy, couples therapy, or individual therapy as part of your treatment. How to recognize changes Everyone responds differently to treatment for depression. As you recover from depression, you may start to: Have more interest in doing activities. Feel more hopeful. Have more energy. Eat a more regular amount of food. Have better mental focus. It is important to recognize if your depression is not getting better or is getting worse. The symptoms you had in the beginning may return, such as: Feeling tired. Eating too much or too little. Sleeping too much or too little. Feeling restless, agitated, or hopeless. Trouble focusing or making decisions. Having unexplained aches and pains. Feeling irritable, angry, or aggressive. If you or your family members notice these symptoms coming back, let your health care provider know right away. Follow these instructions at home: Activity Try to  get some form of exercise each day, such as walking. Try yoga, mindfulness, or other stress reduction techniques. Participate in group activities if you are able. Lifestyle Get enough sleep. Cut down on or stop using caffeine, tobacco,  alcohol, and any other harmful substances. Eat a healthy diet that includes plenty of vegetables, fruits, whole grains, low-fat dairy products, and lean protein. Limit foods that are high in solid fats, added sugar, or salt (sodium). General instructions Take over-the-counter and prescription medicines only as told by your health care provider. Keep all follow-up visits. It is important for your health care provider to check on your mood, behavior, and medicines. Your health care provider may need to make changes to your treatment. Where to find support Talking to others  Friends and family members can be sources of support and guidance. Talk to trusted friends or family members about your condition. Explain your symptoms and let them know that you are working with a health care provider to treat your depression. Tell friends and family how they can help. Finances Find mental health providers that fit with your financial situation. Talk with your health care provider if you are worried about access to food, housing, or medicine. Call your insurance company to learn about your co-pays and prescription plan. Where to find more information You can find support in your area from: Anxiety and Depression Association of America (ADAA): adaa.org Mental Health America: mentalhealthamerica.net [employer] on Mental Illness: nami.org Contact a health care provider if: You stop taking your antidepressant medicines, and you have any of these symptoms: Nausea. Headache. Light-headedness. Chills and body aches. Not being able to sleep (insomnia). You or your friends and family think your depression is getting worse. Get help right away if: You have thoughts of hurting yourself or others. Get help right away if you feel like you may hurt yourself or others, or have thoughts about taking your own life. Go to your nearest emergency room or: Call 911. Call the National Suicide Prevention Lifeline at  360-042-0264 or 988. This is open 24 hours a day. Text the Crisis Text Line at 952-302-5572. This information is not intended to replace advice given to you by your health care provider. Make sure you discuss any questions you have with your health care provider. Document Revised: 10/22/2021 Document Reviewed: 10/22/2021 Elsevier Patient Education  2024 [employer].

## 2023-10-28 ENCOUNTER — Encounter: Payer: Self-pay | Admitting: Nurse Practitioner

## 2023-10-28 ENCOUNTER — Ambulatory Visit (INDEPENDENT_AMBULATORY_CARE_PROVIDER_SITE_OTHER): Payer: Self-pay | Admitting: Nurse Practitioner

## 2023-10-28 VITALS — BP 132/78 | HR 93 | Temp 98.8°F | Ht 64.0 in | Wt 182.8 lb

## 2023-10-28 DIAGNOSIS — G9332 Myalgic encephalomyelitis/chronic fatigue syndrome: Secondary | ICD-10-CM

## 2023-10-28 DIAGNOSIS — F411 Generalized anxiety disorder: Secondary | ICD-10-CM

## 2023-10-28 DIAGNOSIS — F322 Major depressive disorder, single episode, severe without psychotic features: Secondary | ICD-10-CM

## 2023-10-28 MED ORDER — AMPHETAMINE-DEXTROAMPHETAMINE 30 MG PO TABS: 1.0000 | ORAL_TABLET | Freq: Two times a day (BID) | ORAL | 0 refills | Status: DC

## 2023-10-28 MED ORDER — AMPHETAMINE-DEXTROAMPHETAMINE 30 MG PO TABS: 30.0000 mg | ORAL_TABLET | Freq: Two times a day (BID) | ORAL | 0 refills | Status: DC

## 2023-10-28 MED ORDER — FLUOXETINE HCL 40 MG PO CAPS: 40.0000 mg | ORAL_CAPSULE | Freq: Every day | ORAL | 3 refills | Status: AC

## 2023-10-28 MED ORDER — FLUOXETINE HCL 20 MG PO CAPS: 20.0000 mg | ORAL_CAPSULE | Freq: Every day | ORAL | 3 refills | Status: AC

## 2023-10-28 NOTE — Assessment & Plan Note (Signed)
 Chronic, ongoing.  Denies SI/HI.  Will increase Prozac  to 60 MG daily due to increased irritability which suspect is due to hormone changes with menopause.  Discussed at length with her today.  Has not taken Lithium  in some time due to cost.  She reports no suicidal plans and if these do present she does have safety plan in place.  Have recommended again that she apply for Medicaid coverage with new qualifications, her monthly income is <$1732 as single person.  Return in 6 months.

## 2023-10-28 NOTE — Progress Notes (Signed)
 BP 132/78   Pulse 93   Temp 98.8 F (37.1 C) (Oral)   Ht 5\' 4"  (1.626 m)   Wt 182 lb 12.8 oz (82.9 kg)   SpO2 97%   BMI 31.38 kg/m    Subjective:    Patient ID: Courtney Davidson, female    DOB: 26-Jul-1975, 48 y.o.   MRN: [id number]  HPI: Courtney Davidson is a 48 y.o. female  Chief Complaint  Patient presents with   Depression   Fatigue   DEPRESSION Takes Prozac  and Adderall, is feeling more irritable recently.  Gets angry easily. Adderall currently more for chronic fatigue syndrome, she has been on this for several years.  Continues to not be employed, no insurance.  Denies SI/HI. Last filled Adderall on 10/05/23 for 60 pills. Last menstrual cycle 8-9 months ago.  Has not smoked in >3 years.  Mood status: controlled Mood status: stable Satisfied with current treatment?: yes Symptom severity: moderate  Duration of current treatment : chronic Side effects: no Medication compliance: good compliance Psychotherapy/counseling: none Depressed mood: yes Anxious mood: yes Anhedonia: no Significant weight loss or gain: no Insomnia: varies -- occasionally cannot get back to sleep Fatigue: yes Feelings of worthlessness or guilt: no Impaired concentration/indecisiveness: no Suicidal ideations: none Hopelessness: no Crying spells: no    10/28/2023    3:48 PM 04/29/2023    3:36 PM 10/27/2022    3:49 PM 04/25/2022    3:01 PM 10/23/2021    4:04 PM  Depression screen PHQ 2/9  Decreased Interest 1 1 0 0 0  Down, Depressed, Hopeless 1 1 0 0 1  PHQ - 2 Score 2 2 0 0 1  Altered sleeping 1 2 2 2 1   Tired, decreased energy 3 2 2 2 3   Change in appetite 0 1 0 0 1  Feeling bad or failure about yourself  1 1 0 0 0  Trouble concentrating 0 1 0 0 0  Moving slowly or fidgety/restless 0 0 0 0 0  Suicidal thoughts 0 1 0 0 0  PHQ-9 Score 7 10 4 4 6   Difficult doing work/chores Somewhat difficult    Not difficult at all      10/28/2023    3:48 PM 04/29/2023    3:36 PM 10/27/2022     3:49 PM 04/25/2022    3:01 PM  GAD 7 : Generalized Anxiety Score  Nervous, Anxious, on Edge 1 1 1  0  Control/stop worrying 0 0 1 1  Worry too much - different things 0 0 0 0  Trouble relaxing 0 1 0 0  Restless 1 0 0 0  Easily annoyed or irritable 2 2 2  0  Afraid - awful might happen 0 0 0 0  Total GAD 7 Score 4 4 4 1   Anxiety Difficulty Somewhat difficult  Not difficult at all     Relevant past medical, surgical, family and social history reviewed and updated as indicated. Interim medical history since our last visit reviewed. Allergies and medications reviewed and updated.  Review of Systems  Constitutional:  Positive for fatigue. Negative for activity change, appetite change, diaphoresis and fever.  Respiratory:  Negative for cough, chest tightness and shortness of breath.   Cardiovascular:  Negative for chest pain, palpitations and leg swelling.  Psychiatric/Behavioral:  Positive for decreased concentration and sleep disturbance. Negative for self-injury and suicidal ideas. The patient is nervous/anxious.    Per HPI unless specifically indicated above     Objective:  BP 132/78   Pulse 93   Temp 98.8 F (37.1 C) (Oral)   Ht 5\' 4"  (1.626 m)   Wt 182 lb 12.8 oz (82.9 kg)   SpO2 97%   BMI 31.38 kg/m   Wt Readings from Last 3 Encounters:  10/28/23 182 lb 12.8 oz (82.9 kg)  04/29/23 185 lb 9.6 oz (84.2 kg)  10/27/22 178 lb 9.6 oz (81 kg)    Physical Exam Vitals and nursing note reviewed.  Constitutional:      General: She is awake. She is not in acute distress.    Appearance: She is well-developed. She is not ill-appearing.  HENT:     Head: Normocephalic.     Right Ear: Hearing normal.     Left Ear: Hearing normal.  Eyes:     General: Lids are normal.        Right eye: No discharge.        Left eye: No discharge.     Conjunctiva/sclera: Conjunctivae normal.     Pupils: Pupils are equal, round, and reactive to light.  Neck:     Vascular: No carotid bruit.   Cardiovascular:     Rate and Rhythm: Normal rate and regular rhythm.     Heart sounds: Normal heart sounds. No murmur heard.    No gallop.  Pulmonary:     Effort: Pulmonary effort is normal. No accessory muscle usage or respiratory distress.     Breath sounds: Normal breath sounds.  Abdominal:     General: Bowel sounds are normal.     Palpations: Abdomen is soft.  Musculoskeletal:     Cervical back: Normal range of motion and neck supple.     Right lower leg: No edema.     Left lower leg: No edema.  Skin:    General: Skin is warm and dry.  Neurological:     Mental Status: She is alert and oriented to person, place, and time.  Psychiatric:        Attention and Perception: Attention normal.        Mood and Affect: Mood normal.        Speech: Speech normal.        Behavior: Behavior normal. Behavior is cooperative.        Thought Content: Thought content normal.    Results for orders placed or performed during the hospital encounter of 05/11/23  [facility] Molecular Screen- Blood ([facility])   Collection Time: 05/11/23  2:43 PM  Result Value Ref Range   Genetic Analysis Overall Interpretation Negative    Genetic Disease Assessed      [facility] Tier One Population Screen is a screening test that analyzes 11 genes related to hereditary breast and ovarian cancer (HBOC) syndrome, Lynch syndrome, and familial hypercholesterolemia. This test only reports clinically significant pathogenic and  likely pathogenic variants but does not report variants of uncertain significance (VUS). In addition, analysis of the PMS2 gene excludes exons 11-15, which overlap with a known pseudogene (PMS2CL).    Genetic Analysis Report      No pathogenic or likely pathogenic variants were detected in the genes analyzed by this test.Genetic test results should be interpreted in the context of an individual's personal medical and family history. Alteration to medical management is NOT  recommended based  solely on this result. Clinical correlation is advised.Additional Considerations- This is a screening test; individuals may still carry pathogenic or likely pathogenic variant(s) in the tested genes that are not detected by  this test.-  For individuals at risk for these or other related conditions based on factors including personal or family history, diagnostic testing is recommended.- The absence of pathogenic or likely pathogenic variant(s) in the analyzed genes, while reassuring,  does not eliminate the possibility of a hereditary condition; there are other variants and genes associated with heart disease and hereditary cancer that are not included in this test.    Genes Tested See Notes    Disclaimer See Notes    Sequencing Location See Notes    Interpretation Methods and Limitations See Notes       Assessment & Plan:   Problem List Items Addressed This Visit       Nervous and Auditory   CFS (chronic fatigue syndrome)   Chronic, ongoing.  Has been on Adderall for years, started by previous PCP.  Continue current regimen and refill monthly.  Obtain UDS 04/28/24, controlled substance contract up to date.  Refills sent today and predated, PDMP reviewed.  Return in 6 months -- will extend follow-up as patient minimal income and difficult to pay for every 3 month visits.  Have recommended again she apply for Medicaid coverage with new qualifications, her monthly income is <$1732 as single person.  P        Other   Generalized anxiety disorder   Chronic, ongoing.  Refer to depression plan of care.      Relevant Medications   FLUoxetine  (PROZAC ) 40 MG capsule   FLUoxetine  (PROZAC ) 20 MG capsule   Depression, major, single episode, severe (HCC) - Primary   Chronic, ongoing.  Denies SI/HI.  Will increase Prozac  to 60 MG daily due to increased irritability which suspect is due to hormone changes with menopause.  Discussed at length with her today.  Has not taken Lithium  in some time due to  cost.  She reports no suicidal plans and if these do present she does have safety plan in place.  Have recommended again that she apply for Medicaid coverage with new qualifications, her monthly income is <$1732 as single person.  Return in 6 months.      Relevant Medications   FLUoxetine  (PROZAC ) 40 MG capsule   FLUoxetine  (PROZAC ) 20 MG capsule      Follow up plan: Return in about 6 months (around 04/28/2024) for ADD and DEPRESSION.

## 2023-10-28 NOTE — Assessment & Plan Note (Signed)
 Chronic, ongoing.  Has been on Adderall for years, started by previous PCP.  Continue current regimen and refill monthly.  Obtain UDS 04/28/24, controlled substance contract up to date.  Refills sent today and predated, PDMP reviewed.  Return in 6 months -- will extend follow-up as patient minimal income and difficult to pay for every 3 month visits.  Have recommended again she apply for Medicaid coverage with new qualifications, her monthly income is <$1732 as single person.  P

## 2023-10-28 NOTE — Assessment & Plan Note (Signed)
Chronic, ongoing.  Refer to depression plan of care.

## 2023-11-03 ENCOUNTER — Other Ambulatory Visit: Payer: Self-pay

## 2023-11-03 ENCOUNTER — Other Ambulatory Visit: Payer: Self-pay | Admitting: Nurse Practitioner

## 2023-11-04 ENCOUNTER — Other Ambulatory Visit: Payer: Self-pay

## 2023-11-04 ENCOUNTER — Telehealth: Payer: Self-pay | Admitting: Nurse Practitioner

## 2023-11-04 MED ORDER — AMPHETAMINE-DEXTROAMPHETAMINE 30 MG PO TABS
30.0000 mg | ORAL_TABLET | Freq: Two times a day (BID) | ORAL | 0 refills | Status: DC
  Filled 2023-11-04: qty 60, 30d supply, fill #0

## 2023-11-04 MED ORDER — AMPHETAMINE-DEXTROAMPHETAMINE 30 MG PO TABS
1.0000 | ORAL_TABLET | Freq: Two times a day (BID) | ORAL | 0 refills | Status: DC
  Filled 2023-12-04: qty 60, 30d supply, fill #0

## 2023-11-04 MED ORDER — AMPHETAMINE-DEXTROAMPHETAMINE 30 MG PO TABS
30.0000 mg | ORAL_TABLET | Freq: Two times a day (BID) | ORAL | 0 refills | Status: DC
  Filled 2024-02-03: qty 60, 30d supply, fill #0

## 2023-11-04 MED ORDER — AMPHETAMINE-DEXTROAMPHETAMINE 30 MG PO TABS
1.0000 | ORAL_TABLET | Freq: Two times a day (BID) | ORAL | 0 refills | Status: DC
  Filled 2024-01-04: qty 60, 30d supply, fill #0

## 2023-11-04 NOTE — Addendum Note (Signed)
 Addended by: Jaslene Marsteller T on: 11/04/2023 05:17 PM   Modules accepted: Orders

## 2023-11-04 NOTE — Telephone Encounter (Signed)
 Copied from CRM [id number]. Topic: Clinical - Prescription Issue >> Nov 04, 2023  2:28 PM Courtney Davidson wrote: Reason for CRM:  amphetamine -dextroamphetamine  (ADDERALL) 30 MG tablet refill was sent to the wrong pharmacy. Please send the refill to [facility]. Courtney Davidson is out of the medication. Thanks

## 2023-11-05 ENCOUNTER — Other Ambulatory Visit: Payer: Self-pay

## 2023-11-05 NOTE — Telephone Encounter (Signed)
 Requested medication (s) are due for refill today - no  Requested medication (s) are on the active medication list -yes  Future visit scheduled -yes  Last refill: 5/7, 6/6,7/7,8/6 #60  Notes to clinic: non delegated Rx- duplicate request  Requested Prescriptions  Pending Prescriptions Disp Refills   amphetamine -dextroamphetamine  (ADDERALL) 30 MG tablet 60 tablet 0    Sig: Take 1 tablet by mouth 2 (two) times daily.     Not Delegated - Psychiatry:  Stimulants/ADHD Failed - 11/05/2023  3:39 PM      Failed - This refill cannot be delegated      Passed - Urine Drug Screen completed in last 360 days      Passed - Last BP in normal range    BP Readings from Last 1 Encounters:  10/28/23 132/78         Passed - Last Heart Rate in normal range    Pulse Readings from Last 1 Encounters:  10/28/23 93         Passed - Valid encounter within last 6 months    Recent Outpatient Visits           1 week ago Depression, major, single episode, severe (HCC)   [facility] [place], Sanjuan Crumbly T, NP                 Requested Prescriptions  Pending Prescriptions Disp Refills   amphetamine -dextroamphetamine  (ADDERALL) 30 MG tablet 60 tablet 0    Sig: Take 1 tablet by mouth 2 (two) times daily.     Not Delegated - Psychiatry:  Stimulants/ADHD Failed - 11/05/2023  3:39 PM      Failed - This refill cannot be delegated      Passed - Urine Drug Screen completed in last 360 days      Passed - Last BP in normal range    BP Readings from Last 1 Encounters:  10/28/23 132/78         Passed - Last Heart Rate in normal range    Pulse Readings from Last 1 Encounters:  10/28/23 93         Passed - Valid encounter within last 6 months    Recent Outpatient Visits           1 week ago Depression, major, single episode, severe (HCC)   [facility] [place], Lavelle Posey, NP

## 2023-11-05 NOTE — Telephone Encounter (Signed)
 Called and notified patient that medications have been resent.

## 2023-11-06 NOTE — Telephone Encounter (Signed)
 RX confirmed with the pharmacy, please refuse.

## 2023-11-13 ENCOUNTER — Ambulatory Visit: Payer: Self-pay | Admitting: Nurse Practitioner

## 2023-12-03 ENCOUNTER — Other Ambulatory Visit: Payer: Self-pay

## 2023-12-04 ENCOUNTER — Other Ambulatory Visit: Payer: Self-pay

## 2023-12-05 ENCOUNTER — Emergency Department
Admission: EM | Admit: 2023-12-05 | Discharge: 2023-12-05 | Disposition: A | Discharge status: Home / Self Care | Payer: Self-pay | Attending: Emergency Medicine | Admitting: Emergency Medicine

## 2023-12-05 ENCOUNTER — Emergency Department: Payer: Self-pay

## 2023-12-05 ENCOUNTER — Other Ambulatory Visit: Payer: Self-pay

## 2023-12-05 DIAGNOSIS — R519 Headache, unspecified: Secondary | ICD-10-CM | POA: Insufficient documentation

## 2023-12-05 DIAGNOSIS — R509 Fever, unspecified: Secondary | ICD-10-CM | POA: Insufficient documentation

## 2023-12-05 DIAGNOSIS — R258 Other abnormal involuntary movements: Secondary | ICD-10-CM | POA: Insufficient documentation

## 2023-12-05 DIAGNOSIS — R531 Weakness: Secondary | ICD-10-CM | POA: Insufficient documentation

## 2023-12-05 LAB — CBC WITH DIFFERENTIAL/PLATELET
Abs Immature Granulocytes: 0.05 10*3/uL (ref 0.00–0.07)
Basophils Absolute: 0 10*3/uL (ref 0.0–0.1)
Basophils Relative: 0 %
Eosinophils Absolute: 0 10*3/uL (ref 0.0–0.5)
Eosinophils Relative: 0 %
HCT: 40 % (ref 36.0–46.0)
Hemoglobin: 13.8 g/dL (ref 12.0–15.0)
Immature Granulocytes: 0 %
Lymphocytes Relative: 12 %
Lymphs Abs: 1.4 10*3/uL (ref 0.7–4.0)
MCH: 30.3 pg (ref 26.0–34.0)
MCHC: 34.5 g/dL (ref 30.0–36.0)
MCV: 87.7 fL (ref 80.0–100.0)
Monocytes Absolute: 0.6 10*3/uL (ref 0.1–1.0)
Monocytes Relative: 5 %
Neutro Abs: 9.3 10*3/uL — ABNORMAL HIGH (ref 1.7–7.7)
Neutrophils Relative %: 83 %
Platelets: 260 10*3/uL (ref 150–400)
RBC: 4.56 MIL/uL (ref 3.87–5.11)
RDW: 12 % (ref 11.5–15.5)
WBC: 11.3 10*3/uL — ABNORMAL HIGH (ref 4.0–10.5)
nRBC: 0 % (ref 0.0–0.2)

## 2023-12-05 LAB — COMPREHENSIVE METABOLIC PANEL WITH GFR
ALT: 17 U/L (ref 0–44)
AST: 17 U/L (ref 15–41)
Albumin: 3.9 g/dL (ref 3.5–5.0)
Alkaline Phosphatase: 59 U/L (ref 38–126)
Anion gap: 9 (ref 5–15)
BUN: 10 mg/dL (ref 6–20)
CO2: 23 mmol/L (ref 22–32)
Calcium: 9.3 mg/dL (ref 8.9–10.3)
Chloride: 101 mmol/L (ref 98–111)
Creatinine, Ser: 0.84 mg/dL (ref 0.44–1.00)
GFR, Estimated: >60 mL/min (ref >60)
Glucose, Bld: 107 mg/dL — ABNORMAL HIGH (ref 70–99)
Potassium: 3.4 mmol/L — ABNORMAL LOW (ref 3.5–5.1)
Sodium: 133 mmol/L — ABNORMAL LOW (ref 135–145)
Total Bilirubin: 0.9 mg/dL (ref 0.0–1.2)
Total Protein: 7.3 g/dL (ref 6.5–8.1)

## 2023-12-05 LAB — URINALYSIS, W/ REFLEX TO CULTURE (INFECTION SUSPECTED)
Bacteria, UA: NONE SEEN
Bilirubin Urine: NEGATIVE
Glucose, UA: NEGATIVE mg/dL
Ketones, ur: NEGATIVE mg/dL
Leukocytes,Ua: NEGATIVE
Nitrite: NEGATIVE
Protein, ur: NEGATIVE mg/dL
Specific Gravity, Urine: 1.009 (ref 1.005–1.030)
pH: 8 (ref 5.0–8.0)

## 2023-12-05 LAB — POC URINE PREG, ED: Preg Test, Ur: NEGATIVE

## 2023-12-05 LAB — RESP PANEL BY RT-PCR (RSV, FLU A&B, COVID)  RVPGX2
Influenza A by PCR: NEGATIVE
Influenza B by PCR: NEGATIVE
Resp Syncytial Virus by PCR: NEGATIVE
SARS Coronavirus 2 by RT PCR: NEGATIVE

## 2023-12-05 LAB — LACTIC ACID, PLASMA: Lactic Acid, Venous: 0.9 mmol/L (ref 0.5–1.9)

## 2023-12-05 MED ORDER — ACETAMINOPHEN 500 MG PO TABS
1000.0000 mg | ORAL_TABLET | Freq: Once | ORAL | Status: AC
  Administered 2023-12-05: 1000 mg via ORAL
  Filled 2023-12-05: qty 2

## 2023-12-05 NOTE — ED Notes (Signed)
 Patient to ED lobby via wheelchair by EMS.  Per EMS patient from home with complaint of feeling weak and having headache and body felt numb but skin sensitive to touch.  Had similar symptoms yesterday but resolved on their own.  EMS intervention -- temp 100.1, hr 110-114, BP 162/89, pulse oxi 98% on room air, cbg 98.  EMS note empty alcohol bottles at bed side at home patient denies ETOH intake.

## 2023-12-05 NOTE — ED Notes (Signed)
 Pt presented to ED with c/o generalized body aches, weakness, headache, fever. States mild symptoms started yesterday but today symptoms worsened around 1630. Pt A&Ox4. Denies sick contact. States took Aleve  around 1530 with no improvement of symptoms.

## 2023-12-05 NOTE — ED Provider Notes (Signed)
 [facility] Provider Note   Event Date/Time   First MD Initiated Contact with Patient 12/05/23 2058     (approximate) History  Headache, Weakness, and Fever  HPI Courtney Davidson is a 48 y.o. female with a past medical history of alcohol abuse, generalized anxiety disorder, chronic fatigue syndrome, major depressive disorder, chronic knee and shoulder pain, and carpal tunnel syndrome who presents complaining of headaches, she chills, shakes, and weakness that began yesterday.  Patient is a daily drinker and states last drink was last night. ROS: Patient currently denies any vision changes, tinnitus, difficulty speaking, facial droop, sore throat, chest pain, shortness of breath, abdominal pain, nausea/vomiting/diarrhea, dysuria, or weakness/numbness/paresthesias in any extremity   Physical Exam  Triage Vital Signs: ED Triage Vitals  Encounter Vitals Group     BP 12/05/23 2049 (!) 104/59     Systolic BP Percentile --      Diastolic BP Percentile --      Pulse Rate 12/05/23 2049 (!) 116     Resp 12/05/23 2049 20     Temp 12/05/23 2049 (!) 101.5 F (38.6 C)     Temp Source 12/05/23 2049 Oral     SpO2 12/05/23 2049 95 %     Weight --      Height --      Head Circumference --      Peak Flow --      Pain Score 12/05/23 2050 8     Pain Loc --      Pain Education --      Exclude from Growth Chart --    Most recent vital signs: Vitals:   12/05/23 2200 12/05/23 2215  BP: 119/72   Pulse: (!) 108   Resp: (!) 31   Temp:  99.5 F (37.5 C)  SpO2: 97%    General: Awake, oriented x4. CV:  Good peripheral perfusion. Resp:  Normal effort. Abd:  No distention. Other:  Middle-aged obese Caucasian female resting on stretcher with mild tremors ED Results / Procedures / Treatments  Labs (all labs ordered are listed, but only abnormal results are displayed) Labs Reviewed  COMPREHENSIVE METABOLIC PANEL WITH GFR - Abnormal; Notable for the following components:       Result Value   Sodium 133 (*)    Potassium 3.4 (*)    Glucose, Bld 107 (*)    All other components within normal limits  CBC WITH DIFFERENTIAL/PLATELET - Abnormal; Notable for the following components:   WBC 11.3 (*)    Neutro Abs 9.3 (*)    All other components within normal limits  URINALYSIS, W/ REFLEX TO CULTURE (INFECTION SUSPECTED) - Abnormal; Notable for the following components:   Color, Urine YELLOW (*)    APPearance CLEAR (*)    Hgb urine dipstick SMALL (*)    All other components within normal limits  RESP PANEL BY RT-PCR (RSV, FLU A&B, COVID)  [id number]  LACTIC ACID, PLASMA  LACTIC ACID, PLASMA  POC URINE PREG, ED   EKG ED ECG REPORT I, Charleen Conn, the attending physician, personally viewed and interpreted this ECG. Date: 12/05/2023 EKG Time: 2057 Rate: 109 Rhythm: Tachycardic sinus rhythm QRS Axis: normal Intervals: normal ST/T Wave abnormalities: normal Narrative Interpretation: Tachycardic sinus rhythm.  No evidence of acute ischemia RADIOLOGY ED MD interpretation: One-view portable chest x-ray interpreted by me shows no evidence of acute abnormalities including no pneumonia, pneumothorax, or widened mediastinum - All radiology independently interpreted and agree with radiology assessment Official  radiology report(s): DG Chest Port 1 View Result Date: 12/05/2023 CLINICAL DATA:  Fever and chills EXAM: PORTABLE CHEST 1 VIEW COMPARISON:  12/26/2014 FINDINGS: The heart size and mediastinal contours are within normal limits. Both lungs are clear. The visualized skeletal structures are unremarkable. IMPRESSION: No active disease. Electronically Signed   By: Violeta Grey M.D.   On: 12/05/2023 22:27   PROCEDURES: Critical Care performed: No Procedures MEDICATIONS ORDERED IN ED: Medications  acetaminophen  (TYLENOL ) tablet 1,000 mg (1,000 mg Oral Given 12/05/23 2122)   IMPRESSION / MDM / ASSESSMENT AND PLAN / ED COURSE  I reviewed the triage vital signs and the  nursing notes.                             The patient is on the cardiac monitor to evaluate for evidence of arrhythmia and/or significant heart rate changes. Patient's presentation is most consistent with acute presentation with potential threat to life or bodily function. Presentation most consistent with viral syndrome Based on vitals and exam they are nontoxic and stable for discharge.  Given History and Exam I have a lower suspicion for: Emergent CardioPulmonary causes [such as Acute Asthma or COPD Exacerbation, acute Heart Failure or exacerbation, PE, PTX, atypical ACS, PNA]. Emergent Otolaryngeal causes [such as PTA, RPA, Ludwigs, Epiglottitis, EBV].  Regarding Emergent Travel or Immunosuppressive related infectious: I have a low suspicion for acute HIV.  Will provide strict return precautions and instructions on self-isolation/quarantine and anticipatory guidance. Dispo: Discharge home with PCP follow-up   FINAL CLINICAL IMPRESSION(S) / ED DIAGNOSES   Final diagnoses:  Fever, unspecified fever cause  Acute nonintractable headache, unspecified headache type   Rx / DC Orders   ED Discharge Orders     None      Note:  This document was prepared using Dragon voice recognition software and may include unintentional dictation errors.   Tanvi Gatling K, MD 12/05/23 [phone]

## 2023-12-05 NOTE — ED Triage Notes (Addendum)
 Pt brought in by ambulance with c/o headache, chills, shakes, and weakness since yesterday. Pt reports drinking alcohol daily and last drink was last night. Pt is shaking in triage. A/Ox4. Pt has fever and increased HR.

## 2024-01-04 ENCOUNTER — Other Ambulatory Visit: Payer: Self-pay

## 2024-02-02 ENCOUNTER — Other Ambulatory Visit: Payer: Self-pay

## 2024-02-03 ENCOUNTER — Other Ambulatory Visit: Payer: Self-pay

## 2024-03-03 ENCOUNTER — Other Ambulatory Visit: Payer: Self-pay | Admitting: Nurse Practitioner

## 2024-03-03 ENCOUNTER — Other Ambulatory Visit: Payer: Self-pay

## 2024-03-03 MED ORDER — AMPHETAMINE-DEXTROAMPHETAMINE 30 MG PO TABS
1.0000 | ORAL_TABLET | Freq: Two times a day (BID) | ORAL | 0 refills | Status: DC
  Filled 2024-03-03: qty 60, 30d supply, fill #0

## 2024-03-03 NOTE — Telephone Encounter (Signed)
 Requested medication (s) are due for refill today - yes  Requested medication (s) are on the active medication list -yes  Future visit scheduled -no  Last refill: 8/6,7/7,6/6,5/7 #60  Notes to clinic: non delegated Rx  Requested Prescriptions  Pending Prescriptions Disp Refills   amphetamine -dextroamphetamine  (ADDERALL) 30 MG tablet 60 tablet 0    Sig: Take 1 tablet by mouth 2 (two) times daily.     Not Delegated - Psychiatry:  Stimulants/ADHD Failed - 03/03/2024  4:05 PM      Failed - This refill cannot be delegated      Passed - Urine Drug Screen completed in last 360 days      Passed - Last BP in normal range    BP Readings from Last 1 Encounters:  12/05/23 (!) 103/55         Passed - Last Heart Rate in normal range    Pulse Readings from Last 1 Encounters:  12/05/23 (!) 107         Passed - Valid encounter within last 6 months    Recent Outpatient Visits           4 months ago Depression, major, single episode, severe (HCC)   [place] [facility], Melanie T, NP                 Requested Prescriptions  Pending Prescriptions Disp Refills   amphetamine -dextroamphetamine  (ADDERALL) 30 MG tablet 60 tablet 0    Sig: Take 1 tablet by mouth 2 (two) times daily.     Not Delegated - Psychiatry:  Stimulants/ADHD Failed - 03/03/2024  4:05 PM      Failed - This refill cannot be delegated      Passed - Urine Drug Screen completed in last 360 days      Passed - Last BP in normal range    BP Readings from Last 1 Encounters:  12/05/23 (!) 103/55         Passed - Last Heart Rate in normal range    Pulse Readings from Last 1 Encounters:  12/05/23 (!) 107         Passed - Valid encounter within last 6 months    Recent Outpatient Visits           4 months ago Depression, major, single episode, severe (HCC)   [place] [facility] [place], Melanie DASEN, NP

## 2024-03-04 ENCOUNTER — Other Ambulatory Visit: Payer: Self-pay

## 2024-04-01 ENCOUNTER — Other Ambulatory Visit: Payer: Self-pay

## 2024-04-01 ENCOUNTER — Other Ambulatory Visit: Payer: Self-pay | Admitting: Nurse Practitioner

## 2024-04-04 ENCOUNTER — Other Ambulatory Visit: Payer: Self-pay

## 2024-04-04 ENCOUNTER — Other Ambulatory Visit: Payer: Self-pay | Admitting: Nurse Practitioner

## 2024-04-04 NOTE — Telephone Encounter (Signed)
 Requested medication (s) are due for refill today -yes  Requested medication (s) are on the active medication list -yes  Future visit scheduled -no  Last refill: 5/7,6/6,8/6,9/4 #60  Notes to clinic: non delegated Rx  Requested Prescriptions  Pending Prescriptions Disp Refills   amphetamine -dextroamphetamine  (ADDERALL) 30 MG tablet 60 tablet 0    Sig: Take 1 tablet by mouth 2 (two) times daily.     Not Delegated - Psychiatry:  Stimulants/ADHD Failed - 04/04/2024 12:08 PM      Failed - This refill cannot be delegated      Passed - Urine Drug Screen completed in last 360 days      Passed - Last BP in normal range    BP Readings from Last 1 Encounters:  12/05/23 (!) 103/55         Passed - Last Heart Rate in normal range    Pulse Readings from Last 1 Encounters:  12/05/23 (!) 107         Passed - Valid encounter within last 6 months    Recent Outpatient Visits           5 months ago Depression, major, single episode, severe (HCC)   [place] [facility] [place], Melanie T, NP                 Requested Prescriptions  Pending Prescriptions Disp Refills   amphetamine -dextroamphetamine  (ADDERALL) 30 MG tablet 60 tablet 0    Sig: Take 1 tablet by mouth 2 (two) times daily.     Not Delegated - Psychiatry:  Stimulants/ADHD Failed - 04/04/2024 12:08 PM      Failed - This refill cannot be delegated      Passed - Urine Drug Screen completed in last 360 days      Passed - Last BP in normal range    BP Readings from Last 1 Encounters:  12/05/23 (!) 103/55         Passed - Last Heart Rate in normal range    Pulse Readings from Last 1 Encounters:  12/05/23 (!) 107         Passed - Valid encounter within last 6 months    Recent Outpatient Visits           5 months ago Depression, major, single episode, severe (HCC)   [place] [facility] [place], Melanie DASEN, NP

## 2024-04-05 ENCOUNTER — Telehealth: Payer: Self-pay

## 2024-04-05 ENCOUNTER — Other Ambulatory Visit: Payer: Self-pay

## 2024-04-05 MED FILL — Amphetamine-Dextroamphetamine Tab 30 MG: ORAL | 30 days supply | Qty: 60 | Fill #0 | Status: AC

## 2024-04-05 NOTE — Telephone Encounter (Signed)
 Copied from CRM [id number]. Topic: Clinical - Prescription Issue >> Apr 05, 2024 10:18 AM Travis F wrote: Reason for CRM: Patient is calling in because she requested a refill for amphetamine -dextroamphetamine  (ADDERALL) 30 MG tablet and it hasn't been sent to the pharmacy and she is currently out of the medication. Please follow up with patient.

## 2024-04-05 NOTE — Telephone Encounter (Signed)
 Called and notified patient that her refill request was received yesterday and that it has been sent to Jolene for review. Also let patient know that Jolene was out of the office yesterday and will address this request as soon as she can.

## 2024-04-06 NOTE — Telephone Encounter (Signed)
 Requested medication (s) are due for refill today: no  Requested medication (s) are on the active medication list: yes  Last refill:  04/05/24  Future visit scheduled: no  Notes to clinic:  Unable to refill per protocol, cannot delegate. Unable to refuse.      Requested Prescriptions  Pending Prescriptions Disp Refills   amphetamine -dextroamphetamine  (ADDERALL) 30 MG tablet 60 tablet 0    Sig: Take 1 tablet by mouth 2 (two) times daily.     Not Delegated - Psychiatry:  Stimulants/ADHD Failed - 04/06/2024 12:45 PM      Failed - This refill cannot be delegated      Passed - Urine Drug Screen completed in last 360 days      Passed - Last BP in normal range    BP Readings from Last 1 Encounters:  12/05/23 (!) 103/55         Passed - Last Heart Rate in normal range    Pulse Readings from Last 1 Encounters:  12/05/23 (!) 107         Passed - Valid encounter within last 6 months    Recent Outpatient Visits           5 months ago Depression, major, single episode, severe (HCC)   [place] [facility] [place], Melanie DASEN, NP

## 2024-04-06 NOTE — Telephone Encounter (Signed)
RX sent yesterday, please refuse.  

## 2024-04-30 NOTE — Patient Instructions (Signed)
 Managing Depression, Adult Depression is a mental health condition that affects your thoughts, feelings, and actions. Being diagnosed with depression can bring you relief if you did not know why you have felt or behaved a certain way. It could also leave you feeling overwhelmed. Finding ways to manage your symptoms can help you feel more positive about your future. How to manage lifestyle changes Being depressed is difficult. Depression can increase the level of everyday stress. Stress can make depression symptoms worse. You may believe your symptoms cannot be managed or will never improve. However, there are many things you can try to help manage your symptoms. There is hope. Managing stress  Stress is your body's reaction to life changes and events, both good and bad. Stress can add to your feelings of depression. Learning to manage your stress can help lessen your feelings of depression. Try some of the following approaches to reducing your stress (stress reduction techniques): Listen to music that you enjoy and that inspires you. Try using a meditation app or take a meditation class. Develop a practice that helps you connect with your spiritual self. Walk in nature, pray, or go to a place of worship. Practice deep breathing. To do this, inhale slowly through your nose. Pause at the top of your inhale for a few seconds and then exhale slowly, letting yourself relax. Repeat this three or four times. Practice yoga to help relax and work your muscles. Choose a stress reduction technique that works for you. These techniques take time and practice to develop. Set aside 5-15 minutes a day to do them. Therapists can offer training in these techniques. Do these things to help manage stress: Keep a journal. Know your limits. Set healthy boundaries for yourself and others, such as saying "no" when you think something is too much. Pay attention to how you react to certain situations. You may not be able to  control everything, but you can change your reaction. Add humor to your life by watching funny movies or shows. Make time for activities that you enjoy and that relax you. Spend less time using electronics, especially at night before bed. The light from screens can make your brain think it is time to get up rather than go to bed.  Medicines Medicines, such as antidepressants, are often a part of treatment for depression. Talk with your pharmacist or health care provider about all the medicines, supplements, and herbal products that you take, their possible side effects, and what medicines and other products are safe to take together. Make sure to report any side effects you may have to your health care provider. Relationships Your health care provider may suggest family therapy, couples therapy, or individual therapy as part of your treatment. How to recognize changes Everyone responds differently to treatment for depression. As you recover from depression, you may start to: Have more interest in doing activities. Feel more hopeful. Have more energy. Eat a more regular amount of food. Have better mental focus. It is important to recognize if your depression is not getting better or is getting worse. The symptoms you had in the beginning may return, such as: Feeling tired. Eating too much or too little. Sleeping too much or too little. Feeling restless, agitated, or hopeless. Trouble focusing or making decisions. Having unexplained aches and pains. Feeling irritable, angry, or aggressive. If you or your family members notice these symptoms coming back, let your health care provider know right away. Follow these instructions at home: Activity Try to  get some form of exercise each day, such as walking. Try yoga, mindfulness, or other stress reduction techniques. Participate in group activities if you are able. Lifestyle Get enough sleep. Cut down on or stop using caffeine, tobacco,  alcohol, and any other harmful substances. Eat a healthy diet that includes plenty of vegetables, fruits, whole grains, low-fat dairy products, and lean protein. Limit foods that are high in solid fats, added sugar, or salt (sodium). General instructions Take over-the-counter and prescription medicines only as told by your health care provider. Keep all follow-up visits. It is important for your health care provider to check on your mood, behavior, and medicines. Your health care provider may need to make changes to your treatment. Where to find support Talking to others  Friends and family members can be sources of support and guidance. Talk to trusted friends or family members about your condition. Explain your symptoms and let them know that you are working with a health care provider to treat your depression. Tell friends and family how they can help. Finances Find mental health providers that fit with your financial situation. Talk with your health care provider if you are worried about access to food, housing, or medicine. Call your insurance company to learn about your co-pays and prescription plan. Where to find more information You can find support in your area from: Anxiety and Depression Association of America (ADAA): adaa.org Mental Health America: mentalhealthamerica.net The [employer] on Mental Illness: nami.org Contact a health care provider if: You stop taking your antidepressant medicines, and you have any of these symptoms: Nausea. Headache. Light-headedness. Chills and body aches. Not being able to sleep (insomnia). You or your friends and family think your depression is getting worse. Get help right away if: You have thoughts of hurting yourself or others. Get help right away if you feel like you may hurt yourself or others, or have thoughts about taking your own life. Go to your nearest emergency room or: Call 911. Call the National Suicide Prevention Lifeline at  (215) 435-0408 or 988. This is open 24 hours a day. Text the Crisis Text Line at 318 581 7774. This information is not intended to replace advice given to you by your health care provider. Make sure you discuss any questions you have with your health care provider. Document Revised: 10/22/2021 Document Reviewed: 10/22/2021 Elsevier Patient Education  2024 [employer].

## 2024-05-02 ENCOUNTER — Other Ambulatory Visit: Payer: Self-pay

## 2024-05-02 ENCOUNTER — Encounter: Payer: Self-pay | Admitting: Nurse Practitioner

## 2024-05-02 ENCOUNTER — Ambulatory Visit: Payer: Self-pay | Admitting: Nurse Practitioner

## 2024-05-02 VITALS — BP 128/86 | HR 80 | Temp 97.9°F | Ht 64.0 in | Wt 174.2 lb

## 2024-05-02 DIAGNOSIS — G9332 Myalgic encephalomyelitis/chronic fatigue syndrome: Secondary | ICD-10-CM

## 2024-05-02 DIAGNOSIS — F322 Major depressive disorder, single episode, severe without psychotic features: Secondary | ICD-10-CM

## 2024-05-02 DIAGNOSIS — F411 Generalized anxiety disorder: Secondary | ICD-10-CM

## 2024-05-02 DIAGNOSIS — Z79899 Other long term (current) drug therapy: Secondary | ICD-10-CM

## 2024-05-02 MED ORDER — AMPHETAMINE-DEXTROAMPHETAMINE 30 MG PO TABS
1.0000 | ORAL_TABLET | Freq: Two times a day (BID) | ORAL | 0 refills | Status: AC
  Filled 2024-08-05: qty 60, 30d supply, fill #0

## 2024-05-02 MED ORDER — AMPHETAMINE-DEXTROAMPHETAMINE 30 MG PO TABS
1.0000 | ORAL_TABLET | Freq: Two times a day (BID) | ORAL | 0 refills | Status: AC
  Filled 2024-06-03: qty 60, 30d supply, fill #0

## 2024-05-02 MED ORDER — AMPHETAMINE-DEXTROAMPHETAMINE 30 MG PO TABS
30.0000 mg | ORAL_TABLET | Freq: Two times a day (BID) | ORAL | 0 refills | Status: AC
  Filled 2024-05-05: qty 60, 30d supply, fill #0

## 2024-05-02 MED ORDER — AMPHETAMINE-DEXTROAMPHETAMINE 30 MG PO TABS
30.0000 mg | ORAL_TABLET | Freq: Two times a day (BID) | ORAL | 0 refills | Status: AC
  Filled 2024-07-05: qty 60, 30d supply, fill #0

## 2024-05-02 NOTE — Progress Notes (Signed)
 BP 128/86   Pulse 80   Temp 97.9 F (36.6 C) (Oral)   Ht 5' 4 (1.626 m)   Wt 174 lb 4 oz (79 kg)   LMP  (LMP Unknown) Comment: about 1 year  SpO2 97%   BMI 29.91 kg/m    Subjective:    Patient ID: Courtney Davidson, female    DOB: 04-22-1976, 48 y.o.   MRN: [id number]  HPI: Courtney Davidson is a 48 y.o. female  Chief Complaint  Patient presents with   Medication Refill    Adderall   DEPRESSION Takes Prozac  60 MG daily and Adderall 30 MG daily. Denies SI/HI. Last filled Adderall on 04/05/24 for 60 pills. Has not smoked in >4 years.  Mood status: controlled Mood status: stable Satisfied with current treatment?: yes Symptom severity: moderate  Duration of current treatment : chronic Side effects: no Medication compliance: good compliance Psychotherapy/counseling: none Depressed mood: yes Anxious mood: yes Anhedonia: no Significant weight loss or gain: no Insomnia: sleeps more often Fatigue: yes Feelings of worthlessness or guilt: no Impaired concentration/indecisiveness: no Suicidal ideations: none, she reports no suicidal thoughts Hopelessness: no Crying spells: no    05/02/2024    3:42 PM 10/28/2023    3:48 PM 04/29/2023    3:36 PM 10/27/2022    3:49 PM 04/25/2022    3:01 PM  Depression screen PHQ 2/9  Decreased Interest 1 1 1  0 0  Down, Depressed, Hopeless 1 1 1  0 0  PHQ - 2 Score 2 2 2  0 0  Altered sleeping 2 1 2 2 2   Tired, decreased energy 2 3 2 2 2   Change in appetite 1 0 1 0 0  Feeling bad or failure about yourself  1 1 1  0 0  Trouble concentrating 1 0 1 0 0  Moving slowly or fidgety/restless 1 0 0 0 0  Suicidal thoughts 1 0 1 0 0  PHQ-9 Score 11 7 10 4 4   Difficult doing work/chores Somewhat difficult Somewhat difficult         05/02/2024    3:42 PM 10/28/2023    3:48 PM 04/29/2023    3:36 PM 10/27/2022    3:49 PM  GAD 7 : Generalized Anxiety Score  Nervous, Anxious, on Edge 1 1 1 1   Control/stop worrying 1 0 0 1  Worry too much - different  things 1 0 0 0  Trouble relaxing 1 0 1 0  Restless 1 1 0 0  Easily annoyed or irritable 2 2 2 2   Afraid - awful might happen 1 0 0 0  Total GAD 7 Score 8 4 4 4   Anxiety Difficulty Somewhat difficult Somewhat difficult  Not difficult at all    Relevant past medical, surgical, family and social history reviewed and updated as indicated. Interim medical history since our last visit reviewed. Allergies and medications reviewed and updated.  Review of Systems  Constitutional:  Positive for fatigue. Negative for activity change, appetite change, diaphoresis and fever.  Respiratory:  Negative for cough, chest tightness and shortness of breath.   Cardiovascular:  Negative for chest pain, palpitations and leg swelling.  Psychiatric/Behavioral:  Positive for decreased concentration and sleep disturbance. Negative for self-injury and suicidal ideas. The patient is nervous/anxious.    Per HPI unless specifically indicated above     Objective:    BP 128/86   Pulse 80   Temp 97.9 F (36.6 C) (Oral)   Ht 5' 4 (1.626 m)   Wt  174 lb 4 oz (79 kg)   LMP  (LMP Unknown) Comment: about 1 year  SpO2 97%   BMI 29.91 kg/m   Wt Readings from Last 3 Encounters:  05/02/24 174 lb 4 oz (79 kg)  10/28/23 182 lb 12.8 oz (82.9 kg)  04/29/23 185 lb 9.6 oz (84.2 kg)    Physical Exam Vitals and nursing note reviewed.  Constitutional:      General: She is awake. She is not in acute distress.    Appearance: She is well-developed. She is not ill-appearing.  HENT:     Head: Normocephalic.     Right Ear: Hearing normal.     Left Ear: Hearing normal.  Eyes:     General: Lids are normal.        Right eye: No discharge.        Left eye: No discharge.     Conjunctiva/sclera: Conjunctivae normal.     Pupils: Pupils are equal, round, and reactive to light.  Neck:     Vascular: No carotid bruit.  Cardiovascular:     Rate and Rhythm: Normal rate and regular rhythm.     Heart sounds: Normal heart sounds. No  murmur heard.    No gallop.  Pulmonary:     Effort: Pulmonary effort is normal. No accessory muscle usage or respiratory distress.     Breath sounds: Normal breath sounds.  Abdominal:     General: Bowel sounds are normal.     Palpations: Abdomen is soft.  Musculoskeletal:     Cervical back: Normal range of motion and neck supple.     Right lower leg: No edema.     Left lower leg: No edema.  Skin:    General: Skin is warm and dry.  Neurological:     Mental Status: She is alert and oriented to person, place, and time.  Psychiatric:        Attention and Perception: Attention normal.        Mood and Affect: Mood normal.        Speech: Speech normal.        Behavior: Behavior normal. Behavior is cooperative.        Thought Content: Thought content normal.    Results for orders placed or performed during the hospital encounter of 12/05/23  Comprehensive metabolic panel   Collection Time: 12/05/23  8:56 PM  Result Value Ref Range   Sodium 133 (L) 135 - 145 mmol/L   Potassium 3.4 (L) 3.5 - 5.1 mmol/L   Chloride 101 98 - 111 mmol/L   CO2 23 22 - 32 mmol/L   Glucose, Bld 107 (H) 70 - 99 mg/dL   BUN 10 6 - 20 mg/dL   Creatinine, Ser 9.15 0.44 - 1.00 mg/dL   Calcium 9.3 8.9 - 89.6 mg/dL   Total Protein 7.3 6.5 - 8.1 g/dL   Albumin 3.9 3.5 - 5.0 g/dL   AST 17 15 - 41 U/L   ALT 17 0 - 44 U/L   Alkaline Phosphatase 59 38 - 126 U/L   Total Bilirubin 0.9 0.0 - 1.2 mg/dL   GFR, Estimated >39 >39 mL/min   Anion gap 9 5 - 15  CBC with Differential   Collection Time: 12/05/23  8:56 PM  Result Value Ref Range   WBC 11.3 (H) 4.0 - 10.5 K/uL   RBC 4.56 3.87 - 5.11 MIL/uL   Hemoglobin 13.8 12.0 - 15.0 g/dL   HCT 59.9 63.9 - 53.9 %  MCV 87.7 80.0 - 100.0 fL   MCH 30.3 26.0 - 34.0 pg   MCHC 34.5 30.0 - 36.0 g/dL   RDW 87.9 88.4 - 84.4 %   Platelets 260 150 - 400 K/uL   nRBC 0.0 0.0 - 0.2 %   Neutrophils Relative % 83 %   Neutro Abs 9.3 (H) 1.7 - 7.7 K/uL   Lymphocytes Relative 12 %    Lymphs Abs 1.4 0.7 - 4.0 K/uL   Monocytes Relative 5 %   Monocytes Absolute 0.6 0.1 - 1.0 K/uL   Eosinophils Relative 0 %   Eosinophils Absolute 0.0 0.0 - 0.5 K/uL   Basophils Relative 0 %   Basophils Absolute 0.0 0.0 - 0.1 K/uL   Immature Granulocytes 0 %   Abs Immature Granulocytes 0.05 0.00 - 0.07 K/uL  Lactic acid, plasma   Collection Time: 12/05/23  9:19 PM  Result Value Ref Range   Lactic Acid, Venous 0.9 0.5 - 1.9 mmol/L  Urinalysis, w/ Reflex to Culture (Infection Suspected) -Urine, Clean Catch   Collection Time: 12/05/23  9:19 PM  Result Value Ref Range   Specimen Source URINE, RANDOM    Color, Urine YELLOW (A) YELLOW   APPearance CLEAR (A) CLEAR   Specific Gravity, Urine 1.009 1.005 - 1.030   pH 8.0 5.0 - 8.0   Glucose, UA NEGATIVE NEGATIVE mg/dL   Hgb urine dipstick SMALL (A) NEGATIVE   Bilirubin Urine NEGATIVE NEGATIVE   Ketones, ur NEGATIVE NEGATIVE mg/dL   Protein, ur NEGATIVE NEGATIVE mg/dL   Nitrite NEGATIVE NEGATIVE   Leukocytes,Ua NEGATIVE NEGATIVE   RBC / HPF 0-5 0 - 5 RBC/hpf   WBC, UA 0-5 0 - 5 WBC/hpf   Bacteria, UA NONE SEEN NONE SEEN   Squamous Epithelial / HPF 0-5 0 - 5 /HPF   Mucus PRESENT   Resp panel by RT-PCR (RSV, Flu A&B, Covid) Anterior Nasal Swab   Collection Time: 12/05/23  9:25 PM   Specimen: Anterior Nasal Swab  Result Value Ref Range   SARS Coronavirus 2 by RT PCR NEGATIVE NEGATIVE   Influenza A by PCR NEGATIVE NEGATIVE   Influenza B by PCR NEGATIVE NEGATIVE   Resp Syncytial Virus by PCR NEGATIVE NEGATIVE  POC urine preg, ED   Collection Time: 12/05/23 10:19 PM  Result Value Ref Range   Preg Test, Ur NEGATIVE NEGATIVE      Assessment & Plan:   Problem List Items Addressed This Visit       Nervous and Auditory   CFS (chronic fatigue syndrome)   Chronic, ongoing.  Has been on Adderall for years, started by previous PCP.  Continue current regimen and refill monthly.  UDS due next 05/02/25, controlled substance contract up to  date.  Refills sent today and predated, PDMP reviewed.  Return in 6 months -- will extend follow-up as patient minimal income and difficult to pay for every 3 month visits.  Have recommended again she apply for Medicaid coverage with new qualifications, her monthly income is <$1732 as single person.  P        Other   High risk medication use   UDS due next 05/02/25 and controlled substance contract up to date.      Relevant Orders   Drug Screen, Ur (12+Oxycodone+Crt)   Generalized anxiety disorder   Chronic, ongoing.  Refer to depression plan of care.      Relevant Orders   Drug Screen, Ur (12+Oxycodone+Crt)   Depression, major, single episode, severe (HCC) -  Primary   Chronic, ongoing.  Denies SI/HI.  Will continue Prozac  60 MG daily, which was increased last visit due to worsening mood with menopause.  Discussed at length with her today.  Has not taken Lithium  in some time due to cost.  She reports no suicidal plans and if these do present she does have safety plan in place.  Have recommended again that she apply for Medicaid coverage with new qualifications, her monthly income is <$1732 as single person.  Return in 6 months.         Follow up plan: Return in about 6 months (around 10/30/2024) for Depression and CHRONIC FATIGUE.

## 2024-05-02 NOTE — Assessment & Plan Note (Signed)
 Chronic, ongoing.  Denies SI/HI.  Will continue Prozac  60 MG daily, which was increased last visit due to worsening mood with menopause.  Discussed at length with her today.  Has not taken Lithium  in some time due to cost.  She reports no suicidal plans and if these do present she does have safety plan in place.  Have recommended again that she apply for Medicaid coverage with new qualifications, her monthly income is <$1732 as single person.  Return in 6 months.

## 2024-05-02 NOTE — Assessment & Plan Note (Signed)
 UDS due next 05/02/25 and controlled substance contract up to date.

## 2024-05-02 NOTE — Assessment & Plan Note (Signed)
 Chronic, ongoing.  Has been on Adderall for years, started by previous PCP.  Continue current regimen and refill monthly.  UDS due next 05/02/25, controlled substance contract up to date.  Refills sent today and predated, PDMP reviewed.  Return in 6 months -- will extend follow-up as patient minimal income and difficult to pay for every 3 month visits.  Have recommended again she apply for Medicaid coverage with new qualifications, her monthly income is <$1732 as single person.  P

## 2024-05-02 NOTE — Assessment & Plan Note (Signed)
Chronic, ongoing.  Refer to depression plan of care.

## 2024-05-05 ENCOUNTER — Other Ambulatory Visit: Payer: Self-pay

## 2024-06-02 ENCOUNTER — Other Ambulatory Visit: Payer: Self-pay

## 2024-06-03 ENCOUNTER — Other Ambulatory Visit: Payer: Self-pay

## 2024-07-04 ENCOUNTER — Other Ambulatory Visit: Payer: Self-pay

## 2024-07-05 ENCOUNTER — Other Ambulatory Visit: Payer: Self-pay

## 2024-08-03 ENCOUNTER — Other Ambulatory Visit: Payer: Self-pay

## 2024-08-04 ENCOUNTER — Telehealth: Payer: Self-pay

## 2024-08-04 NOTE — Telephone Encounter (Signed)
 Copied from CRM #[id number]. Topic: Clinical - Medication Refill >> Aug 03, 2024  5:07 PM Everette C wrote: Medication: Rx #: [id number]  amphetamine -dextroamphetamine  (ADDERALL) 30 MG tablet [[id number]] - the patient would like to be prescribed a 31 day quantity   Has the patient contacted their pharmacy? Yes (Agent: If no, request that the patient contact the pharmacy for the refill. If patient does not wish to contact the pharmacy document the reason why and proceed with request.) (Agent: If yes, when and what did the pharmacy advise?)  This is the patient's preferred pharmacy:  [facility] [address] [place] [address] Phone: [phone] Fax: [phone]  Is this the correct pharmacy for this prescription? Yes If no, delete pharmacy and type the correct one.   Has the prescription been filled recently? Yes  Is the patient out of the medication? Yes  Has the patient been seen for an appointment in the last year OR does the patient have an upcoming appointment? Yes  Can we respond through MyChart? No  Agent: Please be advised that Rx refills may take up to 3 business days. We ask that you follow-up with your pharmacy.

## 2024-08-05 ENCOUNTER — Other Ambulatory Visit: Payer: Self-pay

## 2024-11-01 ENCOUNTER — Ambulatory Visit: Payer: Self-pay | Admitting: Nurse Practitioner
